# Patient Record
Sex: Male | Born: 1950 | Race: White | Hispanic: No | Marital: Married | Smoking: Former smoker
Health system: Southern US, Community
[De-identification: ages and names within clinical notes are randomized; demographics above are authoritative.]

## PROBLEM LIST (undated history)

## (undated) DIAGNOSIS — F1910 Other psychoactive substance abuse, uncomplicated: Secondary | ICD-10-CM

## (undated) DIAGNOSIS — F419 Anxiety disorder, unspecified: Secondary | ICD-10-CM

## (undated) DIAGNOSIS — I10 Essential (primary) hypertension: Secondary | ICD-10-CM

## (undated) DIAGNOSIS — I4729 Other ventricular tachycardia: Secondary | ICD-10-CM

## (undated) DIAGNOSIS — I493 Ventricular premature depolarization: Secondary | ICD-10-CM

## (undated) DIAGNOSIS — J45909 Unspecified asthma, uncomplicated: Secondary | ICD-10-CM

## (undated) DIAGNOSIS — U071 COVID-19: Secondary | ICD-10-CM

## (undated) DIAGNOSIS — E119 Type 2 diabetes mellitus without complications: Secondary | ICD-10-CM

## (undated) DIAGNOSIS — I509 Heart failure, unspecified: Secondary | ICD-10-CM

## (undated) DIAGNOSIS — K219 Gastro-esophageal reflux disease without esophagitis: Secondary | ICD-10-CM

## (undated) DIAGNOSIS — E785 Hyperlipidemia, unspecified: Secondary | ICD-10-CM

## (undated) HISTORY — DX: Anxiety disorder, unspecified: F41.9

## (undated) HISTORY — DX: Ventricular premature depolarization: I49.3

## (undated) HISTORY — DX: Unspecified asthma, uncomplicated: J45.909

## (undated) HISTORY — DX: Other psychoactive substance abuse, uncomplicated: F19.10

## (undated) HISTORY — DX: Hyperlipidemia, unspecified: E78.5

## (undated) HISTORY — PX: INGUINAL HERNIA REPAIR: SUR1180

## (undated) HISTORY — DX: Other ventricular tachycardia: I47.29

## (undated) HISTORY — DX: Gastro-esophageal reflux disease without esophagitis: K21.9

---

## 2004-03-11 ENCOUNTER — Ambulatory Visit: Payer: Self-pay | Admitting: Family Medicine

## 2004-04-23 ENCOUNTER — Ambulatory Visit: Payer: Self-pay | Admitting: Family Medicine

## 2004-06-03 ENCOUNTER — Ambulatory Visit: Payer: Self-pay | Admitting: Family Medicine

## 2004-08-29 ENCOUNTER — Ambulatory Visit: Payer: Self-pay | Admitting: Family Medicine

## 2004-09-11 ENCOUNTER — Ambulatory Visit: Payer: Self-pay | Admitting: Family Medicine

## 2004-12-06 ENCOUNTER — Ambulatory Visit: Payer: Self-pay | Admitting: Family Medicine

## 2005-05-05 ENCOUNTER — Ambulatory Visit: Payer: Self-pay | Admitting: Family Medicine

## 2018-06-08 ENCOUNTER — Telehealth: Payer: Self-pay

## 2018-06-08 NOTE — Telephone Encounter (Signed)
Please review previous message and advise 

## 2018-06-08 NOTE — Telephone Encounter (Signed)
Do you have a provider to which I can forward this request to?

## 2018-06-08 NOTE — Telephone Encounter (Signed)
-----   Message from Wilford Corner sent at 06/08/2018  9:03 AM EST ----- Regarding: Recall Colon? Hello Bri can you advised of pt recall colon please? MRN [id number]

## 2018-06-08 NOTE — Telephone Encounter (Signed)
Trisha Can you print out the last OV/colon report/biopsies from our old system and let me know. Thanks RG  

## 2018-06-08 NOTE — Telephone Encounter (Signed)
This is a Dr. Lyndel Safe Patient

## 2018-06-09 NOTE — Telephone Encounter (Signed)
These are on your desk for review

## 2018-06-09 NOTE — Telephone Encounter (Signed)
review 

## 2018-06-11 NOTE — Telephone Encounter (Signed)
Per Dr. Leland Her last colonoscopy report the patient was due in 09/2017. I have tried to call the patient to set this up and was unable to leave message.

## 2018-06-16 NOTE — Telephone Encounter (Signed)
Called patient , unable to leave message

## 2018-06-21 NOTE — Telephone Encounter (Signed)
Called patient unable to leave message

## 2019-03-21 ENCOUNTER — Emergency Department (HOSPITAL_COMMUNITY): Payer: Medicare HMO

## 2019-03-21 ENCOUNTER — Inpatient Hospital Stay (HOSPITAL_COMMUNITY)
Admission: EM | Admit: 2019-03-21 | Discharge: 2019-03-25 | DRG: 641 | Disposition: A | Discharge status: Home / Self Care | Payer: Medicare HMO | Attending: Internal Medicine | Admitting: Internal Medicine

## 2019-03-21 ENCOUNTER — Other Ambulatory Visit: Payer: Self-pay

## 2019-03-21 ENCOUNTER — Encounter (HOSPITAL_COMMUNITY): Payer: Self-pay

## 2019-03-21 DIAGNOSIS — E876 Hypokalemia: Secondary | ICD-10-CM | POA: Diagnosis present

## 2019-03-21 DIAGNOSIS — E871 Hypo-osmolality and hyponatremia: Principal | ICD-10-CM | POA: Diagnosis present

## 2019-03-21 DIAGNOSIS — H409 Unspecified glaucoma: Secondary | ICD-10-CM | POA: Diagnosis present

## 2019-03-21 DIAGNOSIS — E860 Dehydration: Secondary | ICD-10-CM | POA: Diagnosis present

## 2019-03-21 DIAGNOSIS — E861 Hypovolemia: Secondary | ICD-10-CM | POA: Diagnosis present

## 2019-03-21 DIAGNOSIS — E785 Hyperlipidemia, unspecified: Secondary | ICD-10-CM | POA: Diagnosis present

## 2019-03-21 DIAGNOSIS — F17210 Nicotine dependence, cigarettes, uncomplicated: Secondary | ICD-10-CM | POA: Diagnosis present

## 2019-03-21 DIAGNOSIS — T502X5A Adverse effect of carbonic-anhydrase inhibitors, benzothiadiazides and other diuretics, initial encounter: Secondary | ICD-10-CM | POA: Diagnosis present

## 2019-03-21 DIAGNOSIS — R630 Anorexia: Secondary | ICD-10-CM | POA: Diagnosis present

## 2019-03-21 DIAGNOSIS — K5900 Constipation, unspecified: Secondary | ICD-10-CM | POA: Diagnosis present

## 2019-03-21 DIAGNOSIS — E870 Hyperosmolality and hypernatremia: Secondary | ICD-10-CM | POA: Diagnosis present

## 2019-03-21 DIAGNOSIS — I10 Essential (primary) hypertension: Secondary | ICD-10-CM | POA: Diagnosis present

## 2019-03-21 DIAGNOSIS — F329 Major depressive disorder, single episode, unspecified: Secondary | ICD-10-CM | POA: Diagnosis present

## 2019-03-21 DIAGNOSIS — E119 Type 2 diabetes mellitus without complications: Secondary | ICD-10-CM | POA: Diagnosis present

## 2019-03-21 DIAGNOSIS — Z20828 Contact with and (suspected) exposure to other viral communicable diseases: Secondary | ICD-10-CM | POA: Diagnosis present

## 2019-03-21 DIAGNOSIS — Z452 Encounter for adjustment and management of vascular access device: Secondary | ICD-10-CM

## 2019-03-21 DIAGNOSIS — F419 Anxiety disorder, unspecified: Secondary | ICD-10-CM | POA: Diagnosis present

## 2019-03-21 DIAGNOSIS — G8929 Other chronic pain: Secondary | ICD-10-CM | POA: Diagnosis present

## 2019-03-21 DIAGNOSIS — Z7984 Long term (current) use of oral hypoglycemic drugs: Secondary | ICD-10-CM

## 2019-03-21 DIAGNOSIS — Z79899 Other long term (current) drug therapy: Secondary | ICD-10-CM

## 2019-03-21 HISTORY — DX: Type 2 diabetes mellitus without complications: E11.9

## 2019-03-21 HISTORY — DX: Essential (primary) hypertension: I10

## 2019-03-21 LAB — CBC
HCT: 35.6 % — ABNORMAL LOW (ref 39.0–52.0)
Hemoglobin: 12.6 g/dL — ABNORMAL LOW (ref 13.0–17.0)
MCH: 27.2 pg (ref 26.0–34.0)
MCHC: 35.4 g/dL (ref 30.0–36.0)
MCV: 76.9 fL — ABNORMAL LOW (ref 80.0–100.0)
Platelets: 242 10*3/uL (ref 150–400)
RBC: 4.63 MIL/uL (ref 4.22–5.81)
RDW: 12.9 % (ref 11.5–15.5)
WBC: 9.1 10*3/uL (ref 4.0–10.5)
nRBC: 0 % (ref 0.0–0.2)

## 2019-03-21 LAB — COMPREHENSIVE METABOLIC PANEL
ALT: 18 U/L (ref 0–44)
AST: 25 U/L (ref 15–41)
Albumin: 4 g/dL (ref 3.5–5.0)
Alkaline Phosphatase: 43 U/L (ref 38–126)
Anion gap: 12 (ref 5–15)
BUN: 6 mg/dL — ABNORMAL LOW (ref 8–23)
CO2: 24 mmol/L (ref 22–32)
Calcium: 8.2 mg/dL — ABNORMAL LOW (ref 8.9–10.3)
Chloride: 73 mmol/L — ABNORMAL LOW (ref 98–111)
Creatinine, Ser: 0.74 mg/dL (ref 0.61–1.24)
GFR calc Af Amer: >60 mL/min (ref >60)
GFR calc non Af Amer: >60 mL/min (ref >60)
Glucose, Bld: 131 mg/dL — ABNORMAL HIGH (ref 70–99)
Potassium: 3.7 mmol/L (ref 3.5–5.1)
Sodium: 109 mmol/L — CL (ref 135–145)
Total Bilirubin: 0.8 mg/dL (ref 0.3–1.2)
Total Protein: 6.6 g/dL (ref 6.5–8.1)

## 2019-03-21 IMAGING — CT CT HEAD W/O CM
4 series · 17 of 47 positions shown, 19 images · non-contrast
Comparison: None.

CLINICAL DATA: Worsening headache.

EXAM:
CT HEAD WITHOUT CONTRAST
TECHNIQUE: Contiguous axial images were obtained from the base of the skull
through the vertex without intravenous contrast.

[Series 3: head wo · axial · 0.41mm/px · z∈[-185,-65]mm · 7 of 32 slices shown, 9 images]
[im 4/32  brain]
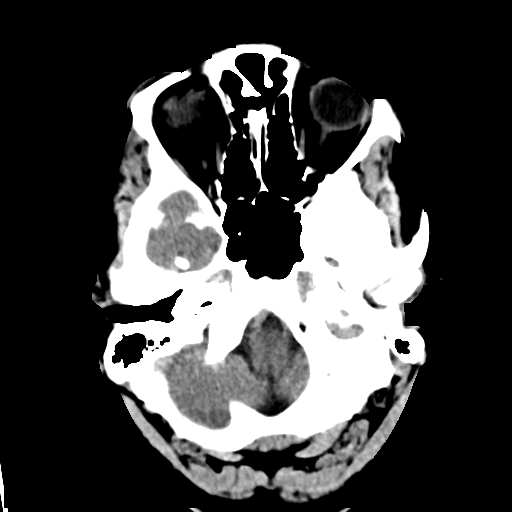
[im 4/32  bone]
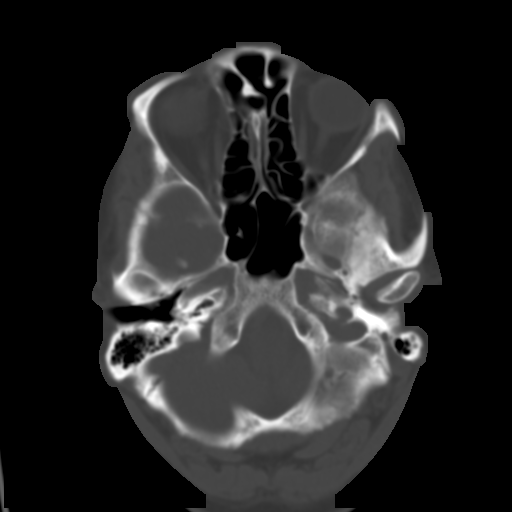
[im 8/32  brain]
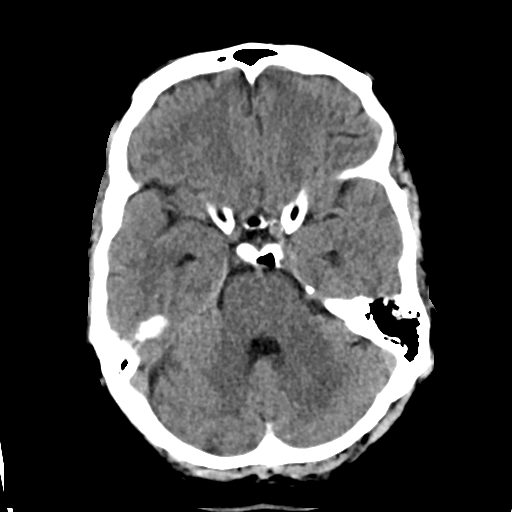
[im 12/32  brain]
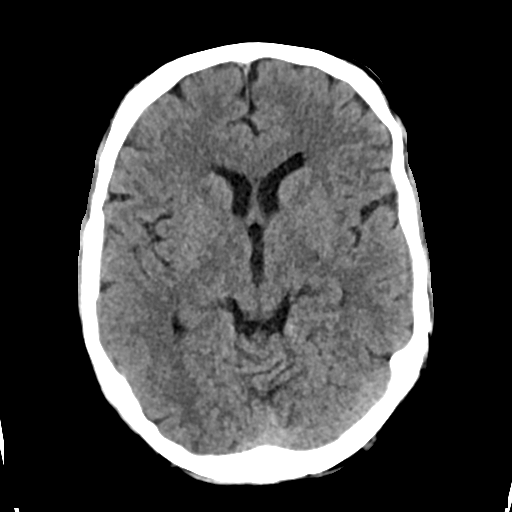
[im 16/32  brain]
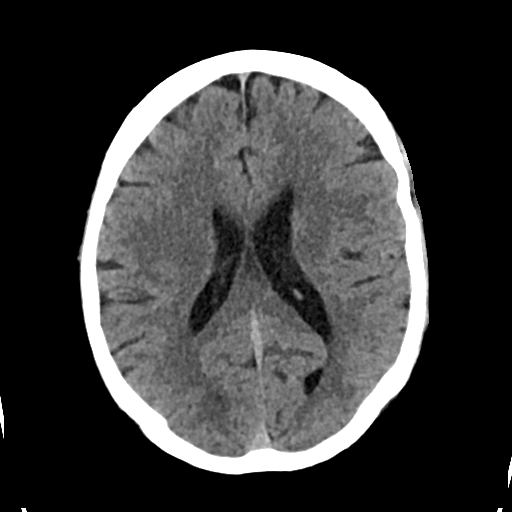
[im 20/32  brain]
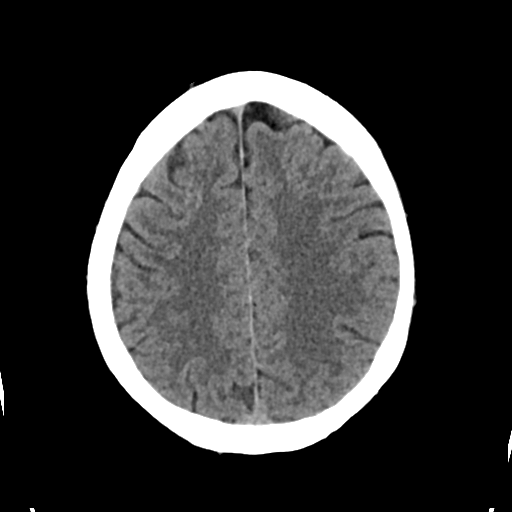
[im 20/32  bone]
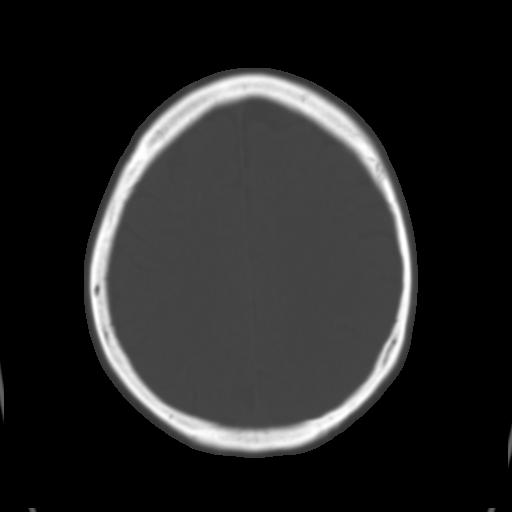
[im 24/32  brain]
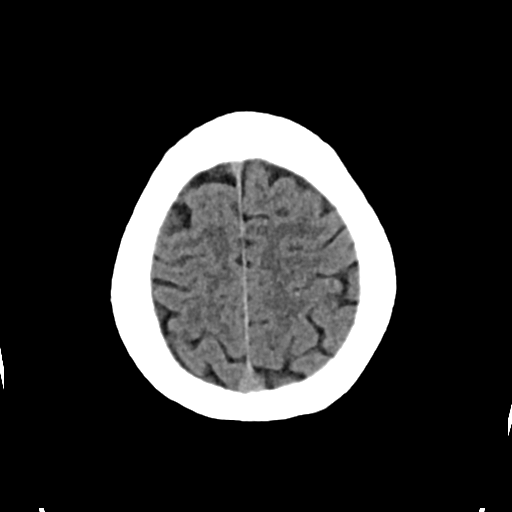
[im 28/32  brain]
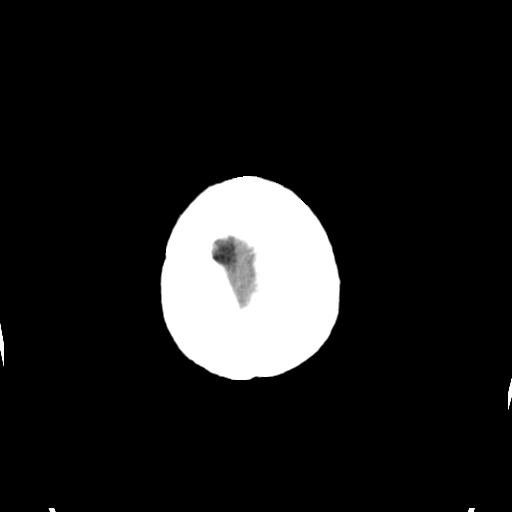

[Series 4: head bone · axial · 0.41mm/px · z∈[-186,-130]mm · 4 of 79 slices shown]
[im 8/79  bone]
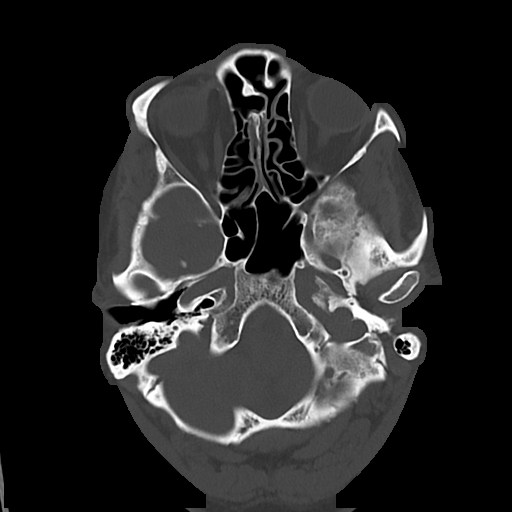
[im 16/79  bone]
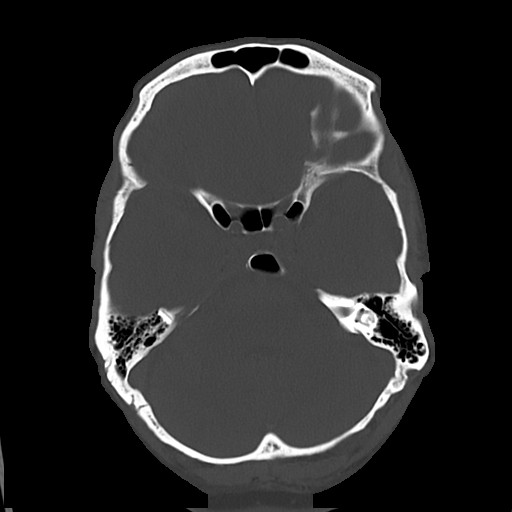
[im 24/79  bone]
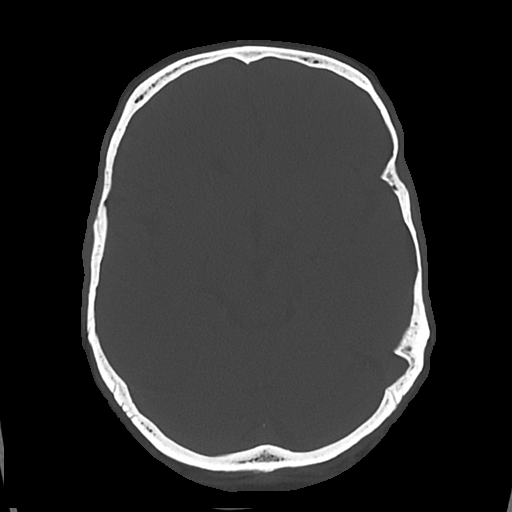
[im 36/79  bone]
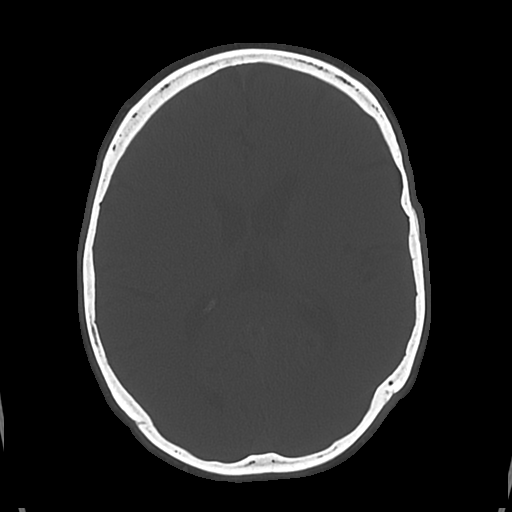

[Series 5: cor soft · coronal · 0.31mm/px · 3 of 66 slices shown]
[im 22/66  brain]
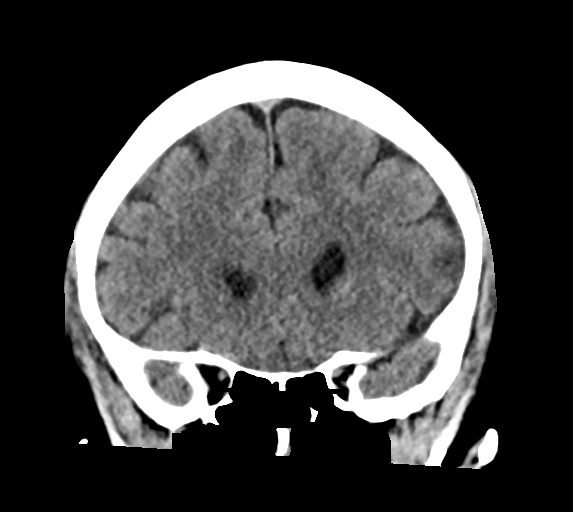
[im 29/66  brain]
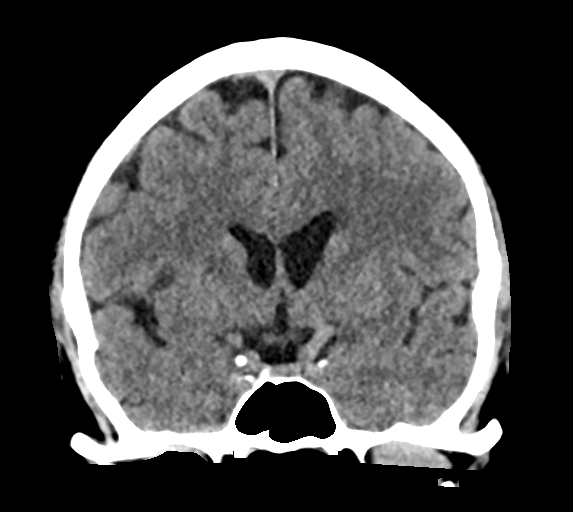
[im 37/66  brain]
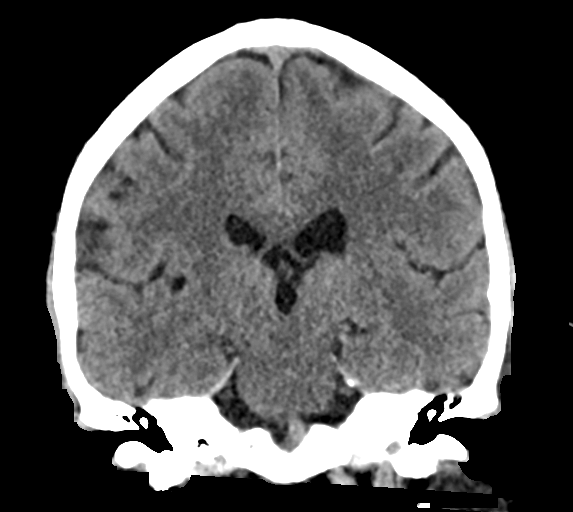

[Series 6: sag soft · sagittal · 0.33mm/px · 3 of 57 slices shown]
[im 19/57  brain]
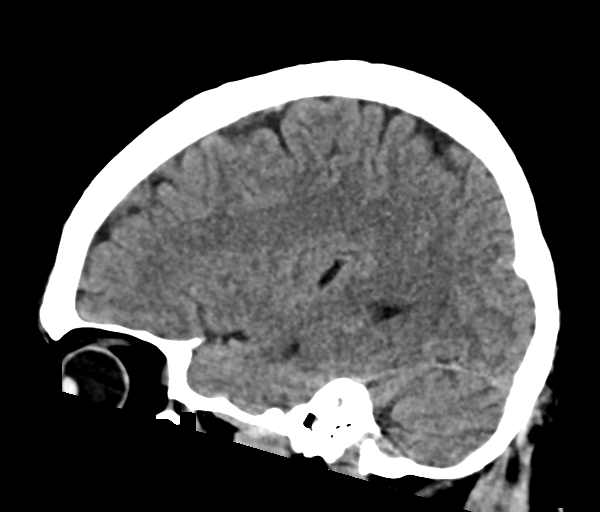
[im 29/57  brain]
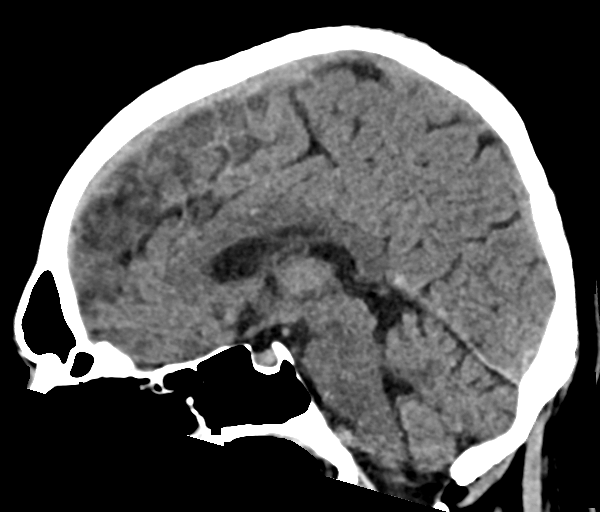
[im 38/57  brain]
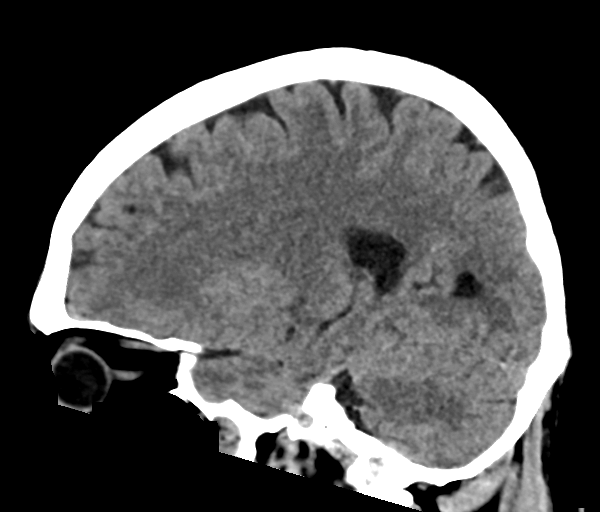

[17 of 47 positions shown; findings below may reference images not displayed]

FINDINGS: Brain: No evidence of acute infarction, hemorrhage, hydrocephalus,
extra-axial collection or mass lesion/mass effect.

Vascular: No hyperdense vessel or unexpected calcification.

Skull: Normal. Negative for fracture or focal lesion.

Sinuses/Orbits: No acute finding.

Other: None.
IMPRESSION: No acute intracranial abnormality.

## 2019-03-21 MED ORDER — SODIUM CHLORIDE 3 % IV SOLN
INTRAVENOUS | Status: DC
  Administered 2019-03-22: 18 mL/h via INTRAVENOUS
  Filled 2019-03-21: qty 500

## 2019-03-21 NOTE — ED Triage Notes (Signed)
Patient states that he is withdrawing from tramadol after taking multiple pills above normal dosage the past month. Nausea and headache. No tremors, alert and oriented, reports that he feels anxious

## 2019-03-21 NOTE — ED Provider Notes (Addendum)
[facility] EMERGENCY DEPARTMENT Provider Note   CSN: [id number] Arrival date & time: 03/21/19  1622     History Chief Complaint  Patient presents with  . pain/ tramadol not helping    Roy Duke is a 68 y.o. male.  HPI    68 year old male comes in a chief complaint of weakness. Patient has history of hypertension, diabetes.  He reports that he ran out of his tramadol 10 days ago.  Yesterday was able to get hold of more tramadol but despite taking it he continues to feel weak.  Patient is having intermittent headaches, generalized weakness, myalgias, dizziness and even confusion.  He is also had some nausea and vomiting.  He has been attributing the symptoms to tramadol withdrawals but despite taking the medications yesterday he continues to have the symptoms.  He denies any other substance abuse or new medication use.  Past Medical History:  Diagnosis Date  . Diabetes mellitus without complication (Roy Duke)   . Hypertension     There are no problems to display for this patient.   History reviewed. No pertinent surgical history.     No family history on file.  Social History   Tobacco Use  . Smoking status: Not on file  Substance Use Topics  . Alcohol use: Not on file  . Drug use: Not on file    Home Medications Prior to Admission medications   Not on File    Allergies    Codeine and Penicillins  Review of Systems   Review of Systems  Constitutional: Positive for activity change and fatigue.  Respiratory: Negative for shortness of breath.   Cardiovascular: Negative for chest pain.  Gastrointestinal: Negative for abdominal pain.  Musculoskeletal: Positive for arthralgias.  Neurological: Positive for dizziness.  Hematological: Does not bruise/bleed easily.  Psychiatric/Behavioral: Positive for confusion.  All other systems reviewed and are negative.   Physical Exam Updated Vital Signs BP 128/83 (BP Location: Left Arm)    Pulse 77   Temp 98.2 F (36.8 C) (Oral)   Resp 16   SpO2 98%   Physical Exam Vitals and nursing note reviewed.  Constitutional:      Appearance: He is well-developed.  HENT:     Head: Atraumatic.  Eyes:     Extraocular Movements: Extraocular movements intact.     Pupils: Pupils are equal, round, and reactive to light.     Comments: No nystagmus  Cardiovascular:     Rate and Rhythm: Normal rate.  Pulmonary:     Effort: Pulmonary effort is normal.  Musculoskeletal:     Cervical back: Neck supple.  Skin:    General: Skin is warm.  Neurological:     Mental Status: He is alert and oriented to person, place, and time.     Cranial Nerves: No cranial nerve deficit.     Sensory: No sensory deficit.     ED Results / Procedures / Treatments   Labs (all labs ordered are listed, but only abnormal results are displayed) Labs Reviewed  COMPREHENSIVE METABOLIC PANEL - Abnormal; Notable for the following components:      Result Value   Sodium 109 (*)    Chloride 73 (*)    Glucose, Bld 131 (*)    BUN 6 (*)    Calcium 8.2 (*)    All other components within normal limits  CBC - Abnormal; Notable for the following components:   Hemoglobin 12.6 (*)    HCT 35.6 (*)  MCV 76.9 (*)    All other components within normal limits  SARS CORONAVIRUS 2 (TAT 6-24 HRS)  URINALYSIS, ROUTINE W REFLEX MICROSCOPIC    EKG EKG Interpretation  Date/Time:  Monday March 21 2019 21:37:03 EST Ventricular Rate:  73 PR Interval:  184 QRS Duration: 170 QT Interval:  468 QTC Calculation: 515 R Axis:   68 Text Interpretation: Sinus rhythm with occasional Premature ventricular complexes Right bundle branch block Abnormal ECG No acute changes Prolonged QT No old tracing to compare Confirmed by Varney Biles [phone]) on 03/21/2019 9:56:09 PM   Radiology CT Head Wo Contrast  Result Date: 03/21/2019 CLINICAL DATA:  Worsening headache. EXAM: CT HEAD WITHOUT CONTRAST TECHNIQUE: Contiguous axial  images were obtained from the base of the skull through the vertex without intravenous contrast. COMPARISON:  None. FINDINGS: Brain: No evidence of acute infarction, hemorrhage, hydrocephalus, extra-axial collection or mass lesion/mass effect. Vascular: No hyperdense vessel or unexpected calcification. Skull: Normal. Negative for fracture or focal lesion. Sinuses/Orbits: No acute finding. Other: None. IMPRESSION: No acute intracranial abnormality. Electronically Signed   By: Constance Holster M.D.   On: 03/21/2019 21:37    Procedures .Critical Care Performed by: Varney Biles, MD Authorized by: Varney Biles, MD   Critical care provider statement:    Critical care time (minutes):  52   Critical care was necessary to treat or prevent imminent or life-threatening deterioration of the following conditions:  Metabolic crisis   Critical care was time spent personally by me on the following activities:  Discussions with consultants, evaluation of patient's response to treatment, examination of patient, ordering and performing treatments and interventions, ordering and review of laboratory studies, ordering and review of radiographic studies, pulse oximetry, re-evaluation of patient's condition, obtaining history from patient or surrogate and review of old charts   (including critical care time)  Medications Ordered in ED Medications - No data to display  ED Course  I have reviewed the triage vital signs and the nursing notes.  Pertinent labs & imaging results that were available during my care of the patient were reviewed by me and considered in my medical decision making (see chart for details).    MDM Rules/Calculators/A&P                      68 year old male comes in a chief complaint of weakness. He started taking tramadol yesterday after being off of it for 10 days.  He has been having multiple weeks symptoms that he has been attributing to tramadol withdrawal, but despite taking the  medications his symptoms have not improved prompting him to come to the ER.  Patient noted to have profound hyponatremia. 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  We will admit him for symptomatic severe hyponatremia.  9:56 PM  Disposition is admission to ICU versus admission to stepdown based on nephrology recommendations.  Final Clinical Impression(s) / ED Diagnoses Final diagnoses:  Acute hyponatremia    Rx / DC Orders ED Discharge Orders    None         Varney Biles, MD 03/21/19 2209

## 2019-03-21 NOTE — ED Notes (Signed)
Had pt placed on monitor, reports nausea and leg cramps.  Pt states he had been having trouble urinating (however he just went alittle) and constipation.

## 2019-03-21 NOTE — Consult Note (Addendum)
Reason for Consult: Hyponatremia Referring Physician:  Dr. Kathrynn Humble  Chief Complaint: Weakness  Assessment/Plan: 1. Hyponatremia - multifactorial but appears to be hypovolemic hyponatremia based on history and exam with SSRI (citalopram)  hypovolemia, nausea (strong stimuli for vasopressin release), pain, HCTZ - Hold HCTZ and citalopram - Will check TSH, cortisol, urine osmolality, urine studies and serum osmolality. - Will start on 3% at .55ml/kg/hr and monitor SNa q2hrs for 24-48hrs -> goal no more than 6-8 incr in SNa /1st 24hr to decr risk of osmotic demyelination. - At risk for overly  Rapid correction which is why lower rate initiated; once there is an upwards trend will consider starting desmopressin to decrease risk of overly rapid correction. - Start at 18 ml/hr (175 lbs) 2. Hypertension - controlled 3. DM 4. Depression 5. Chronic Pain    HPI: NICKLOUS FLORCZAK is an 68 y.o. male HTN DM remote history of alcohol and tobacco use (>30 years ago) who is on tramadol. He had been taking a  higher dose than usual and ran out of a 40 day supply in 30 days, running out 10 days ago. Since then he has been feeling weak with nausea, vomiting, constipation with a very poor appetite. He is on SSRI (citalopram), HCTZ and opiates, denies any recent ETOH use or illicit drug use, not on steroids. He has a headache but no visual disturbances and no h/o seizures. He also denies any weight loss, liver issues, cough, dyspnea, orthopnea or CP.  ROS Pertinent items are noted in HPI.  Chemistry and CBC: Creatinine, Ser  Date/Time Value Ref Range Status  03/21/2019 05:13 PM 0.74 0.61 - 1.24 mg/dL Final   Recent Labs  Lab 03/21/19 1713  NA 109*  K 3.7  CL 73*  CO2 24  GLUCOSE 131*  BUN 6*  CREATININE 0.74  CALCIUM 8.2*   Recent Labs  Lab 03/21/19 1713  WBC 9.1  HGB 12.6*  HCT 35.6*  MCV 76.9*  PLT 242   Liver Function Tests: Recent Labs  Lab 03/21/19 1713  AST 25  ALT 18   ALKPHOS 43  BILITOT 0.8  PROT 6.6  ALBUMIN 4.0   No results for input(s): LIPASE, AMYLASE in the last 168 hours. No results for input(s): AMMONIA in the last 168 hours. Cardiac Enzymes: No results for input(s): CKTOTAL, CKMB, CKMBINDEX, TROPONINI in the last 168 hours. Iron Studies: No results for input(s): IRON, TIBC, TRANSFERRIN, FERRITIN in the last 72 hours. PT/INR: @LABRCNTIP (inr:5)  Xrays/Other Studies: ) Results for orders placed or performed during the hospital encounter of 03/21/19 (from the past 48 hour(s))  Comprehensive metabolic panel     Status: Abnormal   Collection Time: 03/21/19  5:13 PM  Result Value Ref Range   Sodium 109 (LL) 135 - 145 mmol/L    Comment: CRITICAL RESULT CALLED TO, READ BACK BY AND VERIFIED WITH: RN L CHILTON AT 1843 03/21/2019 BY L BENFIELD    Potassium 3.7 3.5 - 5.1 mmol/L   Chloride 73 (L) 98 - 111 mmol/L   CO2 24 22 - 32 mmol/L   Glucose, Bld 131 (H) 70 - 99 mg/dL   BUN 6 (L) 8 - 23 mg/dL   Creatinine, Ser 0.74 0.61 - 1.24 mg/dL   Calcium 8.2 (L) 8.9 - 10.3 mg/dL   Total Protein 6.6 6.5 - 8.1 g/dL   Albumin 4.0 3.5 - 5.0 g/dL   AST 25 15 - 41 U/L   ALT 18 0 - 44 U/L   Alkaline Phosphatase  43 38 - 126 U/L   Total Bilirubin 0.8 0.3 - 1.2 mg/dL   GFR calc non Af Amer >60 >60 mL/min   GFR calc Af Amer >60 >60 mL/min   Anion gap 12 5 - 15    Comment: Performed at [facility] [address] [place], [place] [address]  CBC     Status: Abnormal   Collection Time: 03/21/19  5:13 PM  Result Value Ref Range   WBC 9.1 4.0 - 10.5 K/uL   RBC 4.63 4.22 - 5.81 MIL/uL   Hemoglobin 12.6 (L) 13.0 - 17.0 g/dL   HCT 35.6 (L) 39.0 - 52.0 %   MCV 76.9 (L) 80.0 - 100.0 fL   MCH 27.2 26.0 - 34.0 pg   MCHC 35.4 30.0 - 36.0 g/dL   RDW 12.9 11.5 - 15.5 %   Platelets 242 150 - 400 K/uL   nRBC 0.0 0.0 - 0.2 %    Comment: Performed at [facility], [place] [address] [place],  [address]   CT Head Wo Contrast  Result Date:  03/21/2019 CICAL DATA:  Worsening headache. EXAM: CT HEAD WITHOUT CONTRAST TECHNIQUE: Contiguous axial images were obtained from the base of the skull through the vertex without intravenous contrast. COMPARISON:  None. FINDINGS: Brain: No evidence of acute infarction, hemorrhage, hydrocephalus, extra-axial collection or mass lesion/mass effect. Vascular: No hyperdense vessel or unexpected calcification. Skull: Normal. Negative for fracture or focal lesion. Sinuses/Orbits: No acute finding. Other: None. IMPRESSION: No acute intracranial abnormality. Electronically Signed   By: Constance Holster M.D.   On: 03/21/2019 21:37    PMH:   Past Medical History:  Diagnosis Date  . Diabetes mellitus without complication ([place])   . Hypertension     PSH:  History reviewed. No pertinent surgical history.  Allergies:  Allergies  Allergen Reactions  . Codeine   . Penicillins     Medications:   Prior to Admission medications   Not on File    Discontinued Meds:  There are no discontinued medications.  Social History:  has no history on file for tobacco, alcohol, and drug.  Family History:  No family history on file.  Blood pressure 127/77, pulse 72, temperature 98.2 F (36.8 C), temperature source Oral, resp. rate 16, SpO2 98 %. General appearance: alert, cooperative and appears stated age Head: Normocephalic, without obvious abnormality, atraumatic Eyes: negative Neck: no adenopathy, no carotid bruit, no JVD, supple, symmetrical, trachea midline and thyroid not enlarged, symmetric, no tenderness/mass/nodules Back: symmetric, no curvature. ROM normal. No CVA tenderness. Resp: clear to auscultation bilaterally Cardio: regular rate and rhythm GI: soft, non-tender; bowel sounds normal; no masses,  no organomegaly Extremities: extremities normal, atraumatic, no cyanosis or edema Pulses: 2+ and symmetric Skin: Skin color, texture, turgor normal. No rashes or lesions Lymph nodes: Cervical  adenopathy: none Neurologic: Grossly normal       , Hunt Oris, MD 03/21/2019, 10:34 PM

## 2019-03-21 NOTE — ED Notes (Signed)
Requested pharmacy tech to update pt medication rec.

## 2019-03-21 NOTE — ED Notes (Signed)
Patient transported to CT 

## 2019-03-22 ENCOUNTER — Inpatient Hospital Stay (HOSPITAL_COMMUNITY): Payer: Medicare HMO

## 2019-03-22 ENCOUNTER — Encounter (HOSPITAL_COMMUNITY): Payer: Self-pay | Admitting: Pulmonary Disease

## 2019-03-22 DIAGNOSIS — E870 Hyperosmolality and hypernatremia: Secondary | ICD-10-CM

## 2019-03-22 DIAGNOSIS — G8929 Other chronic pain: Secondary | ICD-10-CM | POA: Diagnosis present

## 2019-03-22 DIAGNOSIS — F329 Major depressive disorder, single episode, unspecified: Secondary | ICD-10-CM | POA: Diagnosis present

## 2019-03-22 DIAGNOSIS — R630 Anorexia: Secondary | ICD-10-CM | POA: Diagnosis present

## 2019-03-22 DIAGNOSIS — Z79899 Other long term (current) drug therapy: Secondary | ICD-10-CM | POA: Diagnosis not present

## 2019-03-22 DIAGNOSIS — T502X5A Adverse effect of carbonic-anhydrase inhibitors, benzothiadiazides and other diuretics, initial encounter: Secondary | ICD-10-CM | POA: Diagnosis present

## 2019-03-22 DIAGNOSIS — F419 Anxiety disorder, unspecified: Secondary | ICD-10-CM | POA: Diagnosis present

## 2019-03-22 DIAGNOSIS — E876 Hypokalemia: Secondary | ICD-10-CM | POA: Diagnosis present

## 2019-03-22 DIAGNOSIS — F17210 Nicotine dependence, cigarettes, uncomplicated: Secondary | ICD-10-CM | POA: Diagnosis present

## 2019-03-22 DIAGNOSIS — Z452 Encounter for adjustment and management of vascular access device: Secondary | ICD-10-CM | POA: Diagnosis not present

## 2019-03-22 DIAGNOSIS — K5900 Constipation, unspecified: Secondary | ICD-10-CM | POA: Diagnosis present

## 2019-03-22 DIAGNOSIS — Z20828 Contact with and (suspected) exposure to other viral communicable diseases: Secondary | ICD-10-CM | POA: Diagnosis present

## 2019-03-22 DIAGNOSIS — E860 Dehydration: Secondary | ICD-10-CM | POA: Diagnosis present

## 2019-03-22 DIAGNOSIS — H409 Unspecified glaucoma: Secondary | ICD-10-CM | POA: Diagnosis present

## 2019-03-22 DIAGNOSIS — Z7984 Long term (current) use of oral hypoglycemic drugs: Secondary | ICD-10-CM | POA: Diagnosis not present

## 2019-03-22 DIAGNOSIS — E861 Hypovolemia: Secondary | ICD-10-CM | POA: Diagnosis present

## 2019-03-22 DIAGNOSIS — E785 Hyperlipidemia, unspecified: Secondary | ICD-10-CM | POA: Diagnosis present

## 2019-03-22 DIAGNOSIS — E871 Hypo-osmolality and hyponatremia: Secondary | ICD-10-CM | POA: Diagnosis present

## 2019-03-22 DIAGNOSIS — E119 Type 2 diabetes mellitus without complications: Secondary | ICD-10-CM | POA: Diagnosis present

## 2019-03-22 DIAGNOSIS — I10 Essential (primary) hypertension: Secondary | ICD-10-CM | POA: Diagnosis present

## 2019-03-22 LAB — BASIC METABOLIC PANEL
Anion gap: 11 (ref 5–15)
Anion gap: 9 (ref 5–15)
Anion gap: 9 (ref 5–15)
BUN: 6 mg/dL — ABNORMAL LOW (ref 8–23)
BUN: 8 mg/dL (ref 8–23)
BUN: 7 mg/dL — ABNORMAL LOW (ref 8–23)
CO2: 25 mmol/L (ref 22–32)
CO2: 25 mmol/L (ref 22–32)
CO2: 26 mmol/L (ref 22–32)
Calcium: 8.6 mg/dL — ABNORMAL LOW (ref 8.9–10.3)
Calcium: 8.3 mg/dL — ABNORMAL LOW (ref 8.9–10.3)
Calcium: 8.7 mg/dL — ABNORMAL LOW (ref 8.9–10.3)
Chloride: 84 mmol/L — ABNORMAL LOW (ref 98–111)
Chloride: 84 mmol/L — ABNORMAL LOW (ref 98–111)
Chloride: 84 mmol/L — ABNORMAL LOW (ref 98–111)
Creatinine, Ser: 0.76 mg/dL (ref 0.61–1.24)
Creatinine, Ser: 0.87 mg/dL (ref 0.61–1.24)
Creatinine, Ser: 0.81 mg/dL (ref 0.61–1.24)
GFR calc Af Amer: >60 mL/min (ref >60)
GFR calc Af Amer: >60 mL/min (ref >60)
GFR calc Af Amer: >60 mL/min (ref >60)
GFR calc non Af Amer: >60 mL/min (ref >60)
GFR calc non Af Amer: >60 mL/min (ref >60)
GFR calc non Af Amer: >60 mL/min (ref >60)
Glucose, Bld: 110 mg/dL — ABNORMAL HIGH (ref 70–99)
Glucose, Bld: 146 mg/dL — ABNORMAL HIGH (ref 70–99)
Glucose, Bld: 125 mg/dL — ABNORMAL HIGH (ref 70–99)
Potassium: 3.3 mmol/L — ABNORMAL LOW (ref 3.5–5.1)
Potassium: 3.8 mmol/L (ref 3.5–5.1)
Potassium: 4 mmol/L (ref 3.5–5.1)
Sodium: 120 mmol/L — ABNORMAL LOW (ref 135–145)
Sodium: 118 mmol/L — CL (ref 135–145)
Sodium: 119 mmol/L — CL (ref 135–145)

## 2019-03-22 LAB — RENAL FUNCTION PANEL
Albumin: 3.4 g/dL — ABNORMAL LOW (ref 3.5–5.0)
Anion gap: 8 (ref 5–15)
BUN: 8 mg/dL (ref 8–23)
CO2: 24 mmol/L (ref 22–32)
Calcium: 8.2 mg/dL — ABNORMAL LOW (ref 8.9–10.3)
Chloride: 88 mmol/L — ABNORMAL LOW (ref 98–111)
Creatinine, Ser: 0.95 mg/dL (ref 0.61–1.24)
GFR calc Af Amer: >60 mL/min (ref >60)
GFR calc non Af Amer: >60 mL/min (ref >60)
Glucose, Bld: 99 mg/dL (ref 70–99)
Phosphorus: 2.5 mg/dL (ref 2.5–4.6)
Potassium: 3.8 mmol/L (ref 3.5–5.1)
Sodium: 120 mmol/L — ABNORMAL LOW (ref 135–145)

## 2019-03-22 LAB — URINALYSIS, ROUTINE W REFLEX MICROSCOPIC
Bilirubin Urine: NEGATIVE
Glucose, UA: NEGATIVE mg/dL
Hgb urine dipstick: NEGATIVE
Ketones, ur: NEGATIVE mg/dL
Leukocytes,Ua: NEGATIVE
Nitrite: NEGATIVE
Protein, ur: NEGATIVE mg/dL
Specific Gravity, Urine: 1.002 — ABNORMAL LOW (ref 1.005–1.030)
pH: 7 (ref 5.0–8.0)

## 2019-03-22 LAB — HIV ANTIBODY (ROUTINE TESTING W REFLEX): HIV Screen 4th Generation wRfx: NONREACTIVE

## 2019-03-22 LAB — OSMOLALITY, URINE: Osmolality, Ur: 104 mOsm/kg — ABNORMAL LOW (ref 300–900)

## 2019-03-22 LAB — TSH: TSH: 1.397 u[IU]/mL (ref 0.350–4.500)

## 2019-03-22 LAB — RESPIRATORY PANEL BY RT PCR (FLU A&B, COVID)
Influenza A by PCR: NEGATIVE
Influenza B by PCR: NEGATIVE
SARS Coronavirus 2 by RT PCR: NEGATIVE

## 2019-03-22 LAB — CBC
HCT: 36.4 % — ABNORMAL LOW (ref 39.0–52.0)
Hemoglobin: 12.7 g/dL — ABNORMAL LOW (ref 13.0–17.0)
MCH: 27.1 pg (ref 26.0–34.0)
MCHC: 34.9 g/dL (ref 30.0–36.0)
MCV: 77.6 fL — ABNORMAL LOW (ref 80.0–100.0)
Platelets: 208 10*3/uL (ref 150–400)
RBC: 4.69 MIL/uL (ref 4.22–5.81)
RDW: 13.2 % (ref 11.5–15.5)
WBC: 5.1 10*3/uL (ref 4.0–10.5)
nRBC: 0 % (ref 0.0–0.2)

## 2019-03-22 LAB — SODIUM, URINE, RANDOM: Sodium, Ur: <10 mmol/L

## 2019-03-22 LAB — MRSA PCR SCREENING: MRSA by PCR: NEGATIVE

## 2019-03-22 LAB — MAGNESIUM: Magnesium: 2 mg/dL (ref 1.7–2.4)

## 2019-03-22 LAB — OSMOLALITY: Osmolality: 248 mOsm/kg — CL (ref 275–295)

## 2019-03-22 LAB — SARS CORONAVIRUS 2 (TAT 6-24 HRS): SARS Coronavirus 2: NEGATIVE

## 2019-03-22 LAB — PHOSPHORUS: Phosphorus: 3 mg/dL (ref 2.5–4.6)

## 2019-03-22 LAB — CREATININE, URINE, RANDOM: Creatinine, Urine: 21.16 mg/dL

## 2019-03-22 IMAGING — DX DG CHEST 1V PORT
1 series · 2 of 2 positions shown · non-contrast
Comparison: Radiograph [DATE]

CLINICAL DATA: Central line placement.

EXAM:
PORTABLE CHEST 1 VIEW

[Series 1: chest ap · 0.14mm/px · 2 of 2 slices shown]
[im 1/2]
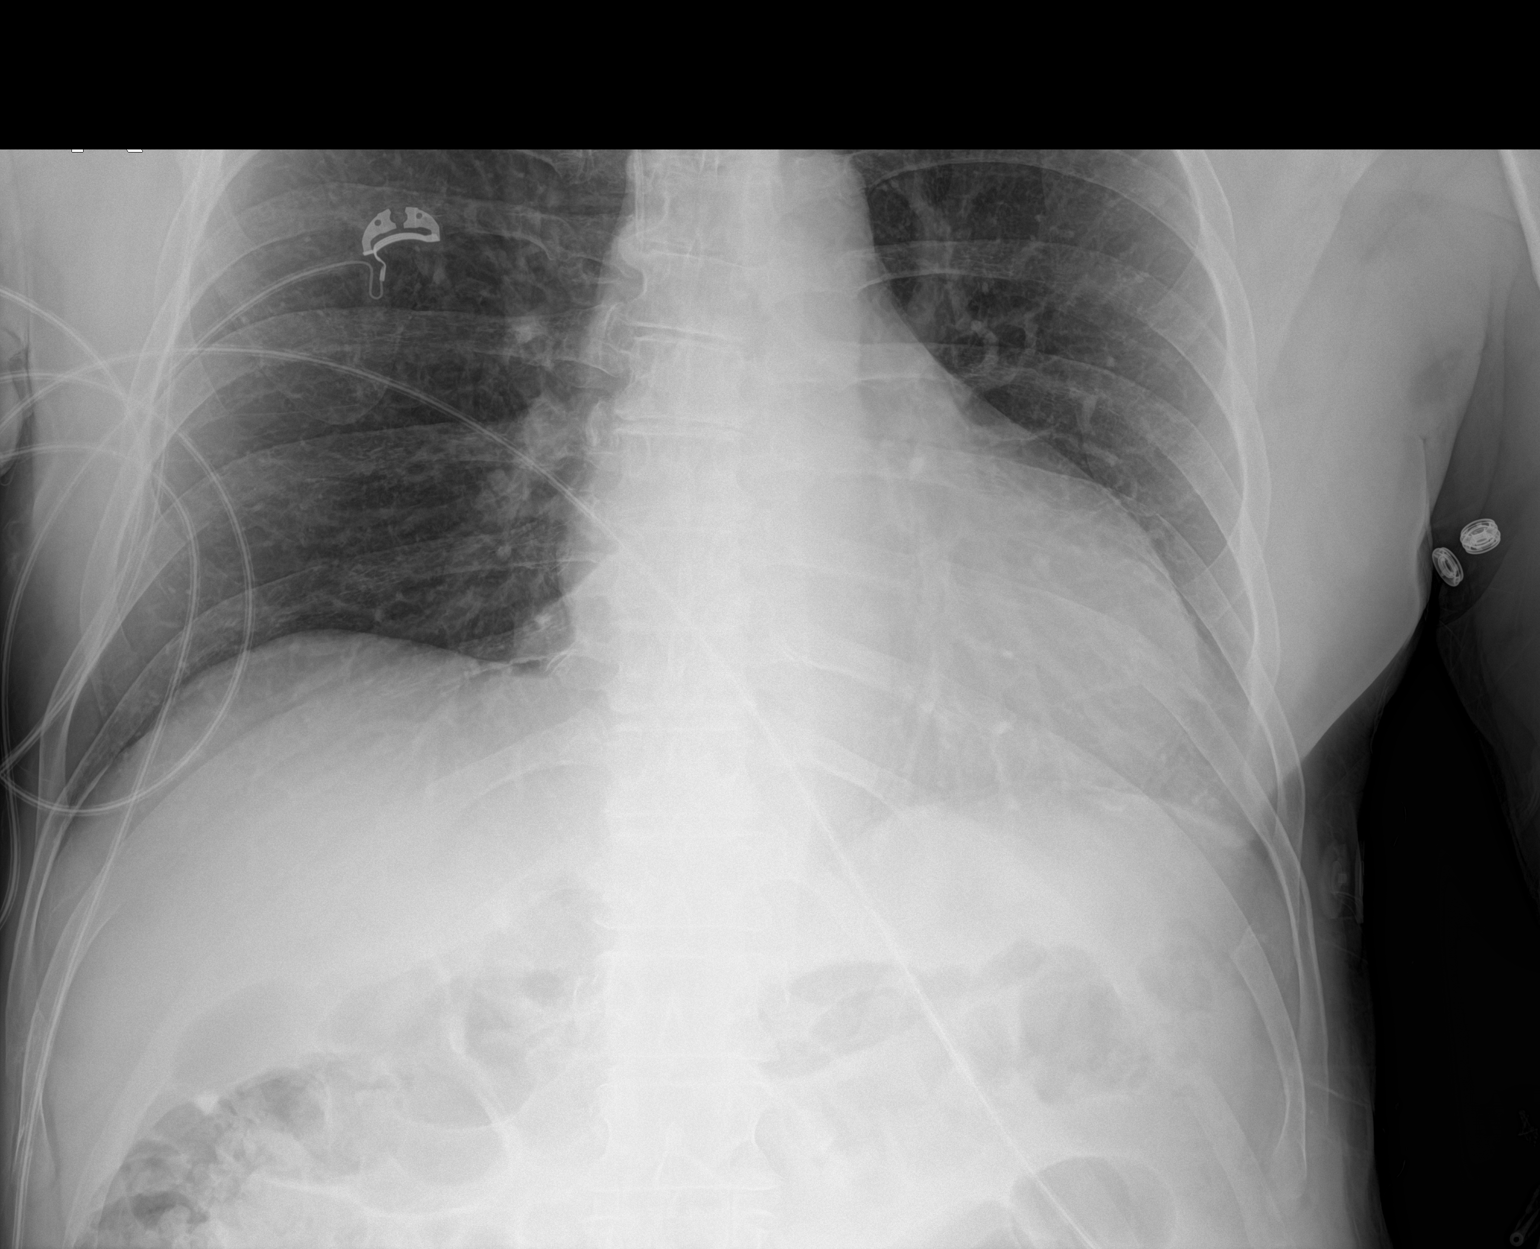
[im 2/2]
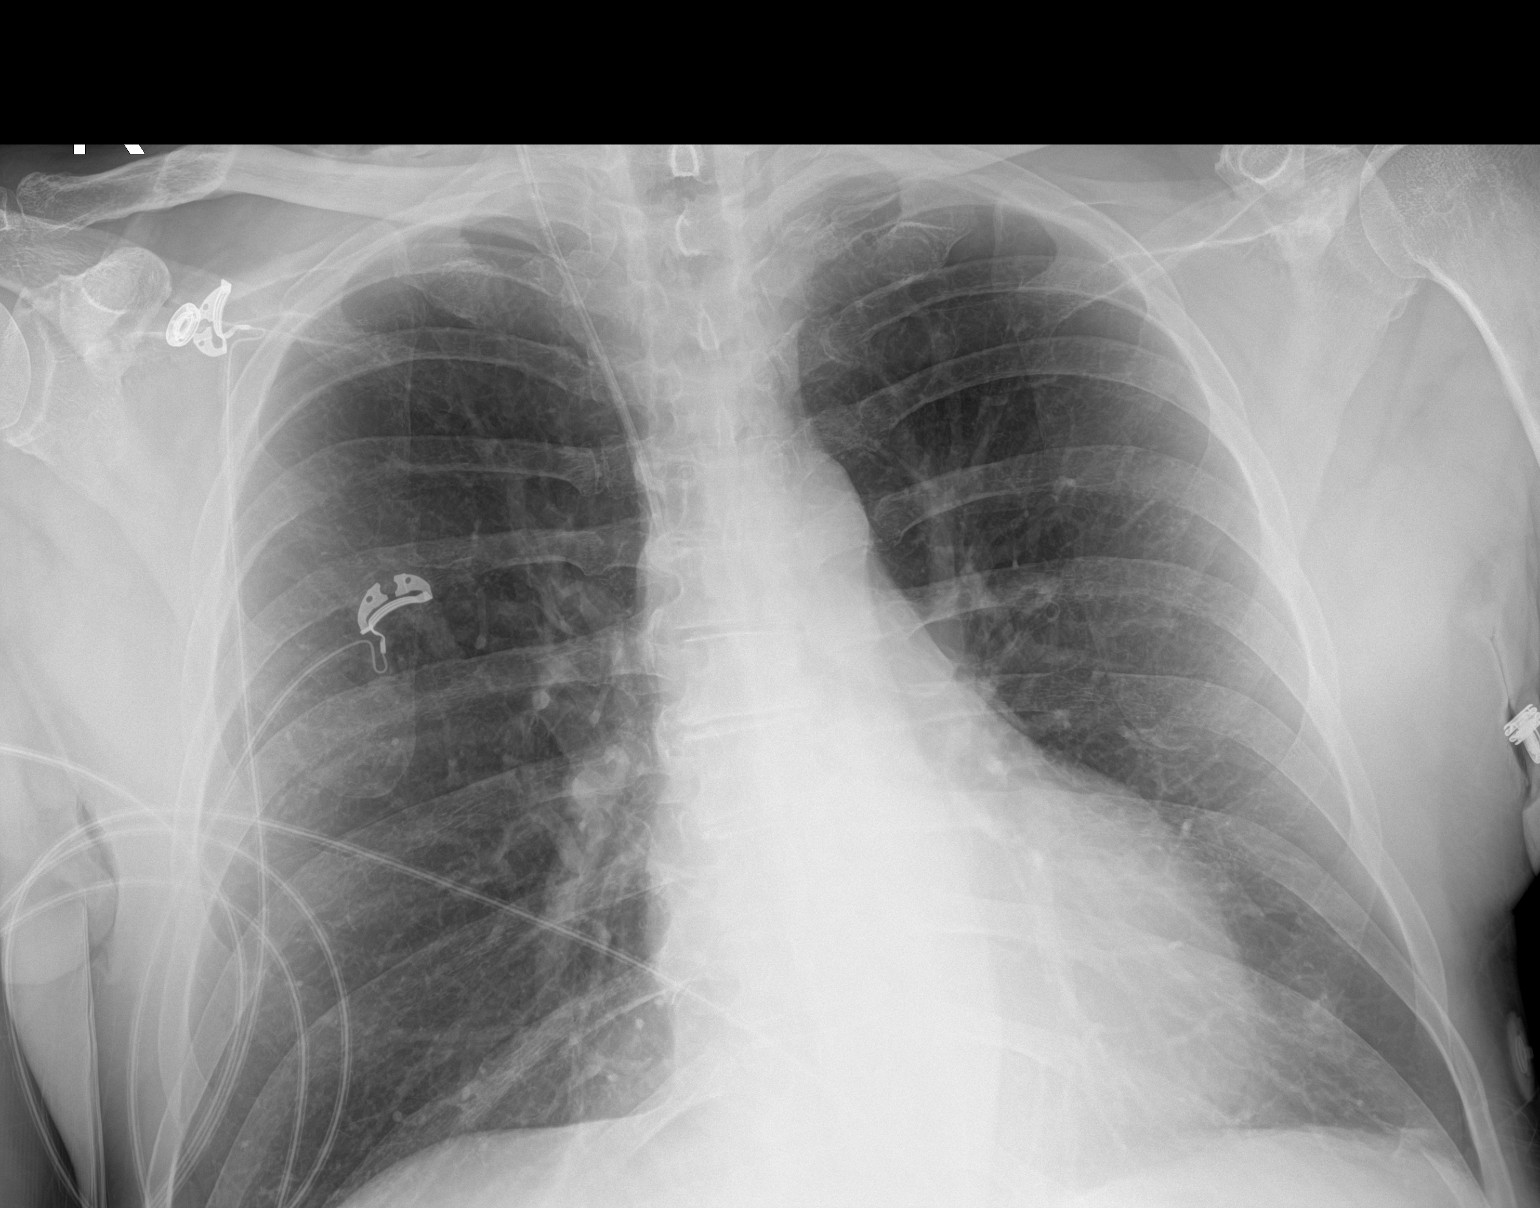

[2 of 2 positions shown; findings below may reference images not displayed]

FINDINGS: Right internal jugular central venous catheter tip in the upper SVC.
No pneumothorax. Normal heart size and mediastinal contours. Mild
hyperinflation. No focal airspace disease, pleural fluid or
pulmonary edema. No acute osseous abnormalities.
IMPRESSION: 1. Right internal jugular central venous catheter tip in the upper
SVC. No pneumothorax.
2. Mild hyperinflation, can be seen with bronchitis, asthma, or
COPD.

## 2019-03-22 MED ORDER — HEPARIN SODIUM (PORCINE) 5000 UNIT/ML IJ SOLN
5000.0000 [IU] | Freq: Three times a day (TID) | INTRAMUSCULAR | Status: DC
  Administered 2019-03-22 – 2019-03-23 (×6): 5000 [IU] via SUBCUTANEOUS
  Filled 2019-03-22 (×6): qty 1

## 2019-03-22 MED ORDER — POTASSIUM CHLORIDE CRYS ER 20 MEQ PO TBCR
40.0000 meq | EXTENDED_RELEASE_TABLET | Freq: Once | ORAL | Status: AC
  Administered 2019-03-22: 40 meq via ORAL
  Filled 2019-03-22: qty 2

## 2019-03-22 MED ORDER — DEXTROSE 5 % IV SOLN: INTRAVENOUS | Status: DC

## 2019-03-22 MED ORDER — CHLORHEXIDINE GLUCONATE CLOTH 2 % EX PADS
6.0000 | MEDICATED_PAD | Freq: Every day | CUTANEOUS | Status: DC
  Administered 2019-03-22 – 2019-03-23 (×2): 6 via TOPICAL

## 2019-03-22 MED ORDER — CLONAZEPAM 0.5 MG PO TBDP
0.5000 mg | ORAL_TABLET | Freq: Two times a day (BID) | ORAL | Status: DC | PRN
  Administered 2019-03-22 – 2019-03-24 (×5): 0.5 mg via ORAL
  Filled 2019-03-22 (×5): qty 1

## 2019-03-22 MED ORDER — PANTOPRAZOLE SODIUM 40 MG PO TBEC
40.0000 mg | DELAYED_RELEASE_TABLET | Freq: Every day | ORAL | Status: DC
  Administered 2019-03-22 – 2019-03-25 (×4): 40 mg via ORAL
  Filled 2019-03-22 (×4): qty 1

## 2019-03-22 MED ORDER — SENNOSIDES-DOCUSATE SODIUM 8.6-50 MG PO TABS
1.0000 | ORAL_TABLET | Freq: Two times a day (BID) | ORAL | Status: DC
  Administered 2019-03-22 – 2019-03-25 (×5): 1 via ORAL
  Filled 2019-03-22 (×6): qty 1

## 2019-03-22 MED ORDER — CLONAZEPAM 0.5 MG PO TBDP: 0.5000 mg | ORAL_TABLET | Freq: Two times a day (BID) | ORAL | Status: DC | PRN

## 2019-03-22 MED ORDER — LORAZEPAM 2 MG/ML IJ SOLN
1.0000 mg | Freq: Once | INTRAMUSCULAR | Status: AC
  Administered 2019-03-22: 1 mg via INTRAVENOUS
  Filled 2019-03-22: qty 1

## 2019-03-22 MED ORDER — ONDANSETRON HCL 4 MG/2ML IJ SOLN
4.0000 mg | Freq: Once | INTRAMUSCULAR | Status: AC
  Administered 2019-03-22: 4 mg via INTRAVENOUS
  Filled 2019-03-22: qty 2

## 2019-03-22 MED ORDER — LORAZEPAM 1 MG PO TABS
1.0000 mg | ORAL_TABLET | Freq: Four times a day (QID) | ORAL | Status: DC | PRN
  Administered 2019-03-22 – 2019-03-23 (×3): 1 mg via ORAL
  Filled 2019-03-22 (×3): qty 1

## 2019-03-22 NOTE — ED Provider Notes (Signed)
.  Central Line  Date/Time: 03/22/2019 1:40 AM Performed by: Ripley Fraise, MD Authorized by: Ripley Fraise, MD   Consent:    Consent obtained:  Written   Consent given by:  Patient   Risks discussed:  Arterial puncture, bleeding and infection   Alternatives discussed:  No treatment Pre-procedure details:    Hand hygiene: Hand hygiene performed prior to insertion     Sterile barrier technique: All elements of maximal sterile technique followed     Skin preparation:  2% chlorhexidine   Skin preparation agent: Skin preparation agent completely dried prior to procedure   Sedation:    Sedation type:  Anxiolysis Anesthesia (see MAR for exact dosages):    Anesthesia method:  Local infiltration   Local anesthetic:  Lidocaine 1% w/o epi Procedure details:    Location:  R internal jugular   Patient position:  Trendelenburg   Procedural supplies:  Triple lumen   Ultrasound guidance: yes     Sterile ultrasound techniques: Sterile gel and sterile probe covers were used     Number of attempts:  1   Successful placement: yes   Post-procedure details:    Post-procedure:  Dressing applied and line sutured   Assessment:  Blood return through all ports, free fluid flow, no pneumothorax on x-ray and placement verified by x-ray   Patient tolerance of procedure:  Tolerated well, no immediate complications   D/w critical care for admission Due to the nature of the hypertonic saline, it is best given to central access.  I had a long conversation with patient about central line placement.  Patient was agreeable to have procedure performed.   Ripley Fraise, MD 03/22/19 [phone]

## 2019-03-22 NOTE — Progress Notes (Signed)
[facility] Progress Note Patient Name: Roy Duke DOB: [birth year] MRN: [id number]   Date of Service  03/22/2019  HPI/Events of Note  Patient admitted to the ICU with severe hyponatremia after presenting to [facility] ED with vague symptoms. He was on hydrochlorothiazide   eICU Interventions  New patient evaluation completed.        Kerry Kass Sunaina Ferrando 03/22/2019, 3:26 AM

## 2019-03-22 NOTE — H&P (Signed)
NAME:  Roy Duke, MRN:  [id number], DOB:  Mar 28, 1951, LOS: 0 ADMISSION DATE:  03/21/2019, CONSULTATION DATE:  03/22/19 REFERRING MD:  Christy Gentles  CHIEF COMPLAINT:  Weakness   Brief History   TRAXTON Duke is a 68 y.o. male who was admitted with severe hyponatremia requiring 3% NS.  History of present illness   Roy Duke is a 68 y.o. male who has a PMH including but not limited to HTN, HLD, DM, depression. He presented to [facility] ED 12/14 with weakness, N/V, constipation, poor appetite.  He also had mild headache which had self resolved.  He had been taking slightly more than usual his dose of tramadol due to generalized pains and he therefore felt that he might have been withdrawing after running out for the past 10 days.  He was found to have severe hyponatremia (109).  It was noted that he was on chronic citalopram and HCTZ.  He used to drink alcohol years and smoke cigarettes ago but no longer does.    He was evaluated by nephrology who started him on hypertonic saline with frequent Na checks per protocol.  Head CT negative.  PCCM was subsequently asked to admit to the ICU.  Past Medical History  HTN, HLD, DM, depression.  Significant Hospital Events   11/15 > admit, started on 3% NS.  Consults:  Nephrology.  Procedures:  CVL pending by EDP 11/15 >   Significant Diagnostic Tests:  [facility] 11/15 > neg.  Micro Data:  SARS CoV2 11/14 >  Flu 11/14 > neg.  Antimicrobials:  None.   Interim history/subjective:  No complaints.  CVL being placed by EDP.  Objective:  Blood pressure 127/77, pulse 72, temperature 98.2 F (36.8 C), temperature source Oral, resp. rate 16, height 5\' 10"  (1.778 m), weight 77.1 kg, SpO2 98 %.       No intake or output data in the 24 hours ending 03/22/19 0129 Filed Weights   03/22/19 0054  Weight: 77.1 kg    Examination: General: Adult male, in NAD. Neuro: A&O x 3, no deficits. HEENT: [place]/AT. Sclerae anicteric.   EOMI.. Cardiovascular: RRR, no M/R/G.  Lungs: Respirations even and unlabored.  CTA bilaterally, No W/R/R.  Abdomen: BS x 4, soft, NT/ND.  Musculoskeletal: No gross deformities, no edema.  Skin: Intact, warm, no rashes.  Assessment & Plan:   Severe hyponatremia - presumed hypovolemic from N/V/decreased appetite + medication related (citalopram + HCTZ). - Appreciate nephrology assistance, recommending 3% at 67ml/hr for goal of max 6-8 mEq Na increase in first 24 hours. - Frequent Na checks per protocol. - F/u on TSH, cortisol, urine studies.  Hx HTN, HLD. - Continue home atorvastatin. - Hold home zestoretic. - Hydralazine PRN for SBP > 170.  Hx DM. - SSI if glucose consistently > 180. - Hold home metformin.  Hx depression. - Hold home citalopram.  Best Practice:  Diet: Heart healthy. Pain/Anxiety/Delirium protocol (if indicated): N/A. VAP protocol (if indicated): N/A. DVT prophylaxis: SCD's / Heparin. GI prophylaxis: N/A. Glucose control: SSI if glucose consistently > 180. Mobility: Bedrest. Code Status: Full. Family Communication: None. Disposition: ICU.  Labs   CBC: Recent Labs  Lab 03/21/19 1713  WBC 9.1  HGB 12.6*  HCT 35.6*  MCV 76.9*  PLT [id number]   Basic Metabolic Panel: Recent Labs  Lab 03/21/19 1713  NA 109*  K 3.7  CL 73*  CO2 24  GLUCOSE 131*  BUN 6*  CREATININE 0.74  CALCIUM 8.2*   GFR: Estimated  Creatinine Clearance: 91.3 mL/min (by C-G formula based on SCr of 0.74 mg/dL). Recent Labs  Lab 03/21/19 1713  WBC 9.1   Liver Function Tests: Recent Labs  Lab 03/21/19 1713  AST 25  ALT 18  ALKPHOS 43  BILITOT 0.8  PROT 6.6  ALBUMIN 4.0   No results for input(s): LIPASE, AMYLASE in the last 168 hours. No results for input(s): AMMONIA in the last 168 hours. ABG No results found for: PHART, PCO2ART, PO2ART, HCO3, TCO2, ACIDBASEDEF, O2SAT  Coagulation Profile: No results for input(s): INR, PROTIME in the last 168 hours. Cardiac  Enzymes: No results for input(s): CKTOTAL, CKMB, CKMBINDEX, TROPONINI in the last 168 hours. HbA1C: No results found for: HGBA1C CBG: No results for input(s): GLUCAP in the last 168 hours.  Review of Systems:   All negative; except for those that are bolded, which indicate positives.  Constitutional: weight loss, weight gain, night sweats, fevers, chills, fatigue, weakness, generalized pain (chronic). HEENT: headaches, sore throat, sneezing, nasal congestion, post nasal drip, difficulty swallowing, tooth/dental problems, visual complaints, visual changes, ear aches. Neuro: difficulty with speech, weakness, numbness, ataxia. CV:  chest pain, orthopnea, PND, swelling in lower extremities, dizziness, palpitations, syncope.  Resp: cough, hemoptysis, dyspnea, wheezing. GI: heartburn, indigestion, abdominal pain, nausea, vomiting, diarrhea, constipation, change in bowel habits, loss of appetite, hematemesis, melena, hematochezia.  GU: dysuria, change in color of urine, urgency or frequency, flank pain, hematuria. MSK: joint pain or swelling, decreased range of motion. Psych: change in mood or affect, depression, anxiety, suicidal ideations, homicidal ideations. Skin: rash, itching, bruising.   Past medical history  He,  has a past medical history of Diabetes mellitus without complication ([place]) and Hypertension.   Surgical History   History reviewed. No pertinent surgical history.   Social History      Family history   His family history is not on file.   Allergies Allergies  Allergen Reactions  . Codeine Nausea Only    "makes me feel weird"  . Penicillins Other (See Comments)    States a long time ago, does not recall reaction      Home meds  Prior to Admission medications   Medication Sig Start Date End Date Taking? Authorizing Provider  atorvastatin (LIPITOR) 40 MG tablet Take 40 mg by mouth daily at 6 PM.  02/16/19  Yes [provider]  citalopram (CELEXA) 10 MG  tablet Take 10 mg by mouth at bedtime.  03/12/19  Yes [provider]  latanoprost (XALATAN) 0.005 % ophthalmic solution Place 1 drop into both eyes at bedtime. 02/16/19  Yes [provider]  lisinopril-hydrochlorothiazide (ZESTORETIC) 20-25 MG tablet Take 1 tablet by mouth daily. 03/06/19  Yes [provider]  metFORMIN (GLUCOPHAGE) 500 MG tablet Take 500 mg by mouth daily. 06/14/18  Yes [provider]  omeprazole (PRILOSEC) 40 MG capsule Take 40 mg by mouth daily. 01/13/19  Yes [provider]  traMADol (ULTRAM) 50 MG tablet Take 100 mg by mouth every 6 (six) hours. 03/20/19  Yes [provider]     Montey Hora, [facility] 03/22/2019, 1:29 AM

## 2019-03-22 NOTE — Progress Notes (Signed)
Seen and examined. No major events. Sodium up too much, correction per nephrology.  Trial of clonazepam for tramadol withdrawal. Can probably go to floor later today  Erskine Emery MD

## 2019-03-22 NOTE — Progress Notes (Signed)
[facility] NEPHROLOGY PROGRESS NOTE  Assessment/ Plan: Pt is a 68 y.o. yo male with HTN, DM, remote history of alcohol use, consulted for hyponatremia, sodium 109 on admission.  #Hyponatremia, hypovolemic: Multifactorial etiology including due to HCTZ, SSRI, nausea and pain causing vasopressin release.  Urine sodium <10 points towards dehydration. -Sodium level 109 on admission and is started 3% saline.  No lab result noted overnight.  This morning the serum sodium level increased to 120.  Discontinue hypertonic saline and start D5W at 50 cc an hour for overcorrection of sodium.  Monitor sodium every 4 hour and adjust the fluid rate.  Discussed with ICU nurse.  #Hypertension: BP acceptable.  May be able to introduce lisinopril gradually.  Discontinue HCTZ.  #Hypokalemia: Replete potassium chloride especially since dextrose will cause cellular shifting.  Subjective: Seen and examined at bedside.  Patient reports sluggish, he got Ativan last night.  Denies nausea, vomiting, headache, dizziness, chest pain or shortness of breath. Objective Vital signs in last 24 hours: Vitals:   03/22/19 0500 03/22/19 0600 03/22/19 0700 03/22/19 0741  BP: 115/78 (!) 141/60 115/61   Pulse:  95 85   Resp: 13 16 (!) 28   Temp:    98.3 F (36.8 C)  TempSrc:    Oral  SpO2:  100% 95%   Weight:      Height:       Weight change:   Intake/Output Summary (Last 24 hours) at 03/22/2019 0806 Last data filed at 03/22/2019 0747 Gross per 24 hour  Intake 81.62 ml  Output 1100 ml  Net -1018.38 ml       Labs: Basic Metabolic Panel: Recent Labs  Lab 03/21/19 1713 03/22/19 0554  NA 109* 120*  K 3.7 3.3*  CL 73* 84*  CO2 24 25  GLUCOSE 131* 110*  BUN 6* 6*  CREATININE 0.74 0.76  CALCIUM 8.2* 8.6*  PHOS  --  3.0   Liver Function Tests: Recent Labs  Lab 03/21/19 1713  AST 25  ALT 18  ALKPHOS 43  BILITOT 0.8  PROT 6.6  ALBUMIN 4.0   No results for input(s): LIPASE, AMYLASE in  the last 168 hours. No results for input(s): AMMONIA in the last 168 hours. CBC: Recent Labs  Lab 03/21/19 1713 03/22/19 0554  WBC 9.1 5.1  HGB 12.6* 12.7*  HCT 35.6* 36.4*  MCV 76.9* 77.6*  PLT 242 208   Cardiac Enzymes: No results for input(s): CKTOTAL, CKMB, CKMBINDEX, TROPONINI in the last 168 hours. CBG: No results for input(s): GLUCAP in the last 168 hours.  Iron Studies: No results for input(s): IRON, TIBC, TRANSFERRIN, FERRITIN in the last 72 hours. Studies/Results: CT Head Wo Contrast  Result Date: 03/21/2019 CLINICAL DATA:  Worsening headache. EXAM: CT HEAD WITHOUT CONTRAST TECHNIQUE: Contiguous axial images were obtained from the base of the skull through the vertex without intravenous contrast. COMPARISON:  None. FINDINGS: Brain: No evidence of acute infarction, hemorrhage, hydrocephalus, extra-axial collection or mass lesion/mass effect. Vascular: No hyperdense vessel or unexpected calcification. Skull: Normal. Negative for fracture or focal lesion. Sinuses/Orbits: No acute finding. Other: None. IMPRESSION: No acute intracranial abnormality. Electronically Signed   By: Constance Holster M.D.   On: 03/21/2019 21:37   DG Chest Port 1 View  Result Date: 03/22/2019 CLINICAL DATA:  Central line placement. EXAM: PORTABLE CHEST 1 VIEW COMPARISON:  Radiograph 09/07/2004 FINDINGS: Right internal jugular central venous catheter tip in the upper SVC. No pneumothorax. Normal heart size and mediastinal contours. Mild hyperinflation. No  focal airspace disease, pleural fluid or pulmonary edema. No acute osseous abnormalities. IMPRESSION: 1. Right internal jugular central venous catheter tip in the upper SVC. No pneumothorax. 2. Mild hyperinflation, can be seen with bronchitis, asthma, or COPD. Electronically Signed   By: Keith Rake M.D.   On: 03/22/2019 02:10    Medications: Infusions: . dextrose      Scheduled Medications: . Chlorhexidine Gluconate Cloth  6 each Topical  Daily  . heparin  5,000 Units Subcutaneous Q8H    have reviewed scheduled and prn medications.  Physical Exam: General:NAD, comfortable Heart:RRR, s1s2 nl Lungs:clear b/l, no crackle Abdomen:soft, Non-tender, non-distended Extremities:No edema Neurology: Alert, awake and following commands.  Medard Decuir Reesa Chew Hailee Hollick 03/22/2019,8:06 AM  LOS: 0 days  Pager: [phone]

## 2019-03-23 ENCOUNTER — Inpatient Hospital Stay (HOSPITAL_COMMUNITY): Payer: Medicare HMO

## 2019-03-23 DIAGNOSIS — Z452 Encounter for adjustment and management of vascular access device: Secondary | ICD-10-CM

## 2019-03-23 DIAGNOSIS — E871 Hypo-osmolality and hyponatremia: Principal | ICD-10-CM

## 2019-03-23 LAB — RENAL FUNCTION PANEL
Albumin: 3.5 g/dL (ref 3.5–5.0)
Anion gap: 8 (ref 5–15)
BUN: 7 mg/dL — ABNORMAL LOW (ref 8–23)
CO2: 25 mmol/L (ref 22–32)
Calcium: 8.4 mg/dL — ABNORMAL LOW (ref 8.9–10.3)
Chloride: 86 mmol/L — ABNORMAL LOW (ref 98–111)
Creatinine, Ser: 0.56 mg/dL — ABNORMAL LOW (ref 0.61–1.24)
GFR calc Af Amer: >60 mL/min (ref >60)
GFR calc non Af Amer: >60 mL/min (ref >60)
Glucose, Bld: 147 mg/dL — ABNORMAL HIGH (ref 70–99)
Phosphorus: 2.2 mg/dL — ABNORMAL LOW (ref 2.5–4.6)
Potassium: 3.7 mmol/L (ref 3.5–5.1)
Sodium: 119 mmol/L — CL (ref 135–145)

## 2019-03-23 LAB — SODIUM
Sodium: 121 mmol/L — ABNORMAL LOW (ref 135–145)
Sodium: 121 mmol/L — ABNORMAL LOW (ref 135–145)

## 2019-03-23 IMAGING — DX DG CHEST 1V PORT
1 series · 1 of 1 positions shown · non-contrast
Comparison: [DATE]

CLINICAL DATA: Respiratory failure. Shortness of breath.

EXAM:
PORTABLE CHEST 1 VIEW

[chest ap]
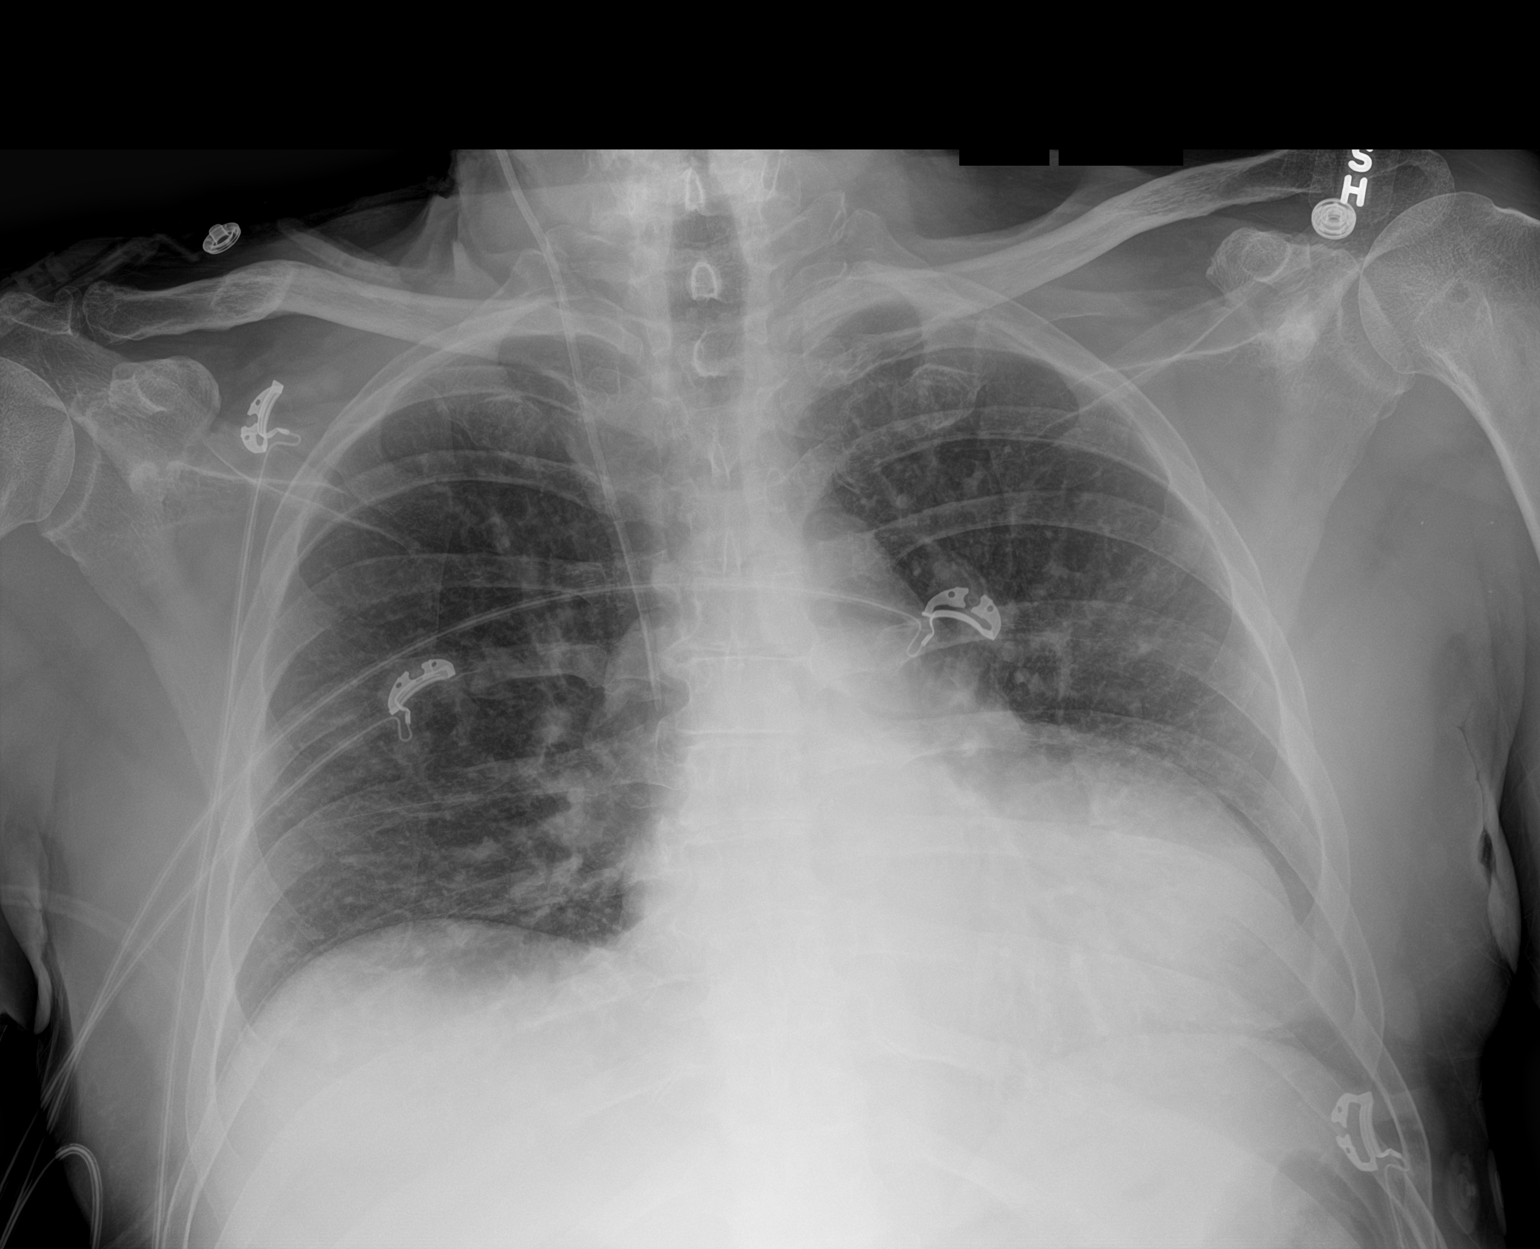

[1 of 1 positions shown; findings below may reference images not displayed]

FINDINGS: Right IJ catheter tip projects over the cavoatrial junction. Cardiac
enlargement. Decreased lung volumes compared with previous exam. No
airspace opacities identified. Blunting of the costophrenic angles
may reflect small pleural effusions.
IMPRESSION: 1. No change in aeration to the lungs compared with previous exam.
2. Suspect small bilateral pleural effusions.

## 2019-03-23 MED ORDER — POTASSIUM CHLORIDE CRYS ER 20 MEQ PO TBCR
40.0000 meq | EXTENDED_RELEASE_TABLET | Freq: Once | ORAL | Status: AC
  Administered 2019-03-23: 40 meq via ORAL
  Filled 2019-03-23: qty 2

## 2019-03-23 MED ORDER — CHLORHEXIDINE GLUCONATE CLOTH 2 % EX PADS: 6.0000 | MEDICATED_PAD | Freq: Every day | CUTANEOUS | Status: DC

## 2019-03-23 MED ORDER — LATANOPROST 0.005 % OP SOLN
1.0000 [drp] | Freq: Every day | OPHTHALMIC | Status: DC
  Administered 2019-03-23 – 2019-03-24 (×2): 1 [drp] via OPHTHALMIC
  Filled 2019-03-23: qty 2.5

## 2019-03-23 MED ORDER — ATORVASTATIN CALCIUM 40 MG PO TABS
40.0000 mg | ORAL_TABLET | Freq: Every day | ORAL | Status: DC
  Administered 2019-03-23 – 2019-03-24 (×2): 40 mg via ORAL
  Filled 2019-03-23 (×2): qty 1

## 2019-03-23 MED ORDER — CARVEDILOL 3.125 MG PO TABS
3.1250 mg | ORAL_TABLET | Freq: Two times a day (BID) | ORAL | Status: DC
  Administered 2019-03-23 – 2019-03-24 (×2): 3.125 mg via ORAL
  Filled 2019-03-23 (×2): qty 1

## 2019-03-23 NOTE — Progress Notes (Signed)
Roy Duke  Assessment/ Plan: Pt is a 68 y.o. yo male with HTN, DM, remote history of alcohol use, consulted for hyponatremia, sodium 109 on admission.  #Hyponatremia, hypovolemic: Multifactorial etiology including due to HCTZ, SSRI, nausea and pain causing vasopressin release.  Urine sodium <10 points towards dehydration. -Sodium level 109 on admission and started 3% saline.  The sodium level overcorrected therefore started on D5W yesterday.  Sodium level 119, increased by 10 in around 32 hours.  I will discontinue D5W and continue to monitor sodium level every 4 hour.  #Hypertension: BP acceptable.  May be able to introduce lisinopril gradually.  Discontinue HCTZ.  #Hypokalemia: Replete KCl.  Monitor lab.  Subjective: Seen and examined at bedside.  Feels weak.  No new event.  Received D5W and the dose was increased 100 cc/h yesterday evening. Objective Vital signs in last 24 hours: Vitals:   03/23/19 0403 03/23/19 0500 03/23/19 0600 03/23/19 0755  BP:  (!) 157/113 127/77   Pulse:  81 (!) 28   Resp:  19 18   Temp: 98 F (36.7 C)   (!) 97.5 F (36.4 C)  TempSrc: Axillary   Oral  SpO2:  95% 96%   Weight:   78.2 kg   Height:       Weight change: 1.089 kg  Intake/Output Summary (Last 24 hours) at 03/23/2019 0825 Last data filed at 03/23/2019 0600 Gross per 24 hour  Intake 1838.72 ml  Output 2300 ml  Net -461.28 ml       Labs: Basic Metabolic Panel: Recent Labs  Lab 03/22/19 0554 03/22/19 1439 03/22/19 2045 03/23/19 0434  NA 120* 119* 120* 119*  K 3.3* 4.0 3.8 3.7  CL 84* 84* 88* 86*  CO2 25 26 24 25   GLUCOSE 110* 125* 99 147*  BUN 6* 7* 8 7*  CREATININE 0.76 0.81 0.95 0.56*  CALCIUM 8.6* 8.7* 8.2* 8.4*  PHOS 3.0  --  2.5 2.2*   Liver Function Tests: Recent Labs  Lab 03/21/19 1713 03/22/19 2045 03/23/19 0434  AST 25  --   --   ALT 18  --   --   ALKPHOS 43  --   --   BILITOT 0.8  --   --   PROT 6.6  --   --    ALBUMIN 4.0 3.4* 3.5   No results for input(s): LIPASE, AMYLASE in the last 168 hours. No results for input(s): AMMONIA in the last 168 hours. CBC: Recent Labs  Lab 03/21/19 1713 03/22/19 0554  WBC 9.1 5.1  HGB 12.6* 12.7*  HCT 35.6* 36.4*  MCV 76.9* 77.6*  PLT 242 208   Cardiac Enzymes: No results for input(s): CKTOTAL, CKMB, CKMBINDEX, TROPONINI in the last 168 hours. CBG: No results for input(s): GLUCAP in the last 168 hours.  Iron Studies: No results for input(s): IRON, TIBC, TRANSFERRIN, FERRITIN in the last 72 hours. Studies/Results: CT Head Wo Contrast  Result Date: 03/21/2019 CLINICAL DATA:  Worsening headache. EXAM: CT HEAD WITHOUT CONTRAST TECHNIQUE: Contiguous axial images were obtained from the base of the skull through the vertex without intravenous contrast. COMPARISON:  None. FINDINGS: Brain: No evidence of acute infarction, hemorrhage, hydrocephalus, extra-axial collection or mass lesion/mass effect. Vascular: No hyperdense vessel or unexpected calcification. Skull: Normal. Negative for fracture or focal lesion. Sinuses/Orbits: No acute finding. Other: None. IMPRESSION: No acute intracranial abnormality. Electronically Signed   By: Constance Holster M.D.   On: 03/21/2019 21:37   DG Chest Port 1  View  Result Date: 03/22/2019 CLINICAL DATA:  Central line placement. EXAM: PORTABLE CHEST 1 VIEW COMPARISON:  Radiograph 09/07/2004 FINDINGS: Right internal jugular central venous catheter tip in the upper SVC. No pneumothorax. Normal heart size and mediastinal contours. Mild hyperinflation. No focal airspace disease, pleural fluid or pulmonary edema. No acute osseous abnormalities. IMPRESSION: 1. Right internal jugular central venous catheter tip in the upper SVC. No pneumothorax. 2. Mild hyperinflation, can be seen with bronchitis, asthma, or COPD. Electronically Signed   By: Keith Rake M.D.   On: 03/22/2019 02:10    Medications: Infusions:   Scheduled  Medications: . Chlorhexidine Gluconate Cloth  6 each Topical Daily  . heparin  5,000 Units Subcutaneous Q8H  . pantoprazole  40 mg Oral Daily  . senna-docusate  1 tablet Oral BID    have reviewed scheduled and prn medications.  Physical Exam: General:NAD, comfortable Heart:RRR, s1s2 nl Lungs: Clear b/l, no crackle Abdomen:soft, Non-tender, non-distended Extremities:No edema Neurology: Alert, awake and following commands.  Seeley Hissong Prasad Kara Melching 03/23/2019,8:25 AM  LOS: 1 day  Pager: [phone]

## 2019-03-23 NOTE — Progress Notes (Signed)
PROGRESS NOTE    JAYVIER BLAKELEY  [id number] DOB: [birth year] DOA: 03/21/2019 PCP: Patient, No Pcp Per   Brief Narrative: 68 year old male with hypertension hyperlipidemia type 2 diabetes anxiety and depression admitted with weakness nausea vomiting constipation poor appetite on 03/22/2019.  He was found to be profoundly hyponatremic with a sodium of 109.  Prior to admission he was on citalopram and hydrochlorothiazide.  Patient admitted by [facility] and followed by nephrology.  He was treated with hypertonic saline.  Head CT negative.  TRH pickup 03/23/2019. Assessment & Plan:   Active Problems:   Hypernatremia  #1 hypovolemic hyponatremia likely secondary to use of SSRI and HCTZ.  Treated with hypertonic saline.  Sodium up to 119 today.  BMP every 4.  Due to overcorrection of sodium IV fluids were switched to D5 yesterday.  Nephrology has discontinued D5W and wants to monitor sodium.  #2 hypertension 1 42/94?   restart ACE inhibitor soon.  Will start him on small dose of Coreg.  #3 hypokalemia potassium 3.7, give 40 of K. Dur.  Keep K above 4.  Multiple PVCs on the monitor.  #4 possible tramadol withdrawal on Klonopin.  #5 hyperlipidemia on Lipitor at home will restart.  #6 glaucoma restart Xalatan eyedrops.  #7 type 2 diabetes on Glucophage 500 mg daily continue SSI for now.      Estimated body mass index is 24.74 kg/m as calculated from the following:   Height as of this encounter: 5\' 10"  (1.778 m).   Weight as of this encounter: 78.2 kg.  DVT prophylaxis: Heparin  code Status: Full code  family Communication: None  disposition Plan: Pending clinical improvement Consultants:   Nephrology and PCCM  Procedures: None Antimicrobials: None  Subjective: Patient is resting in bed was asleep when I walked into the room but able to wake him up patient able to follow commands and answer questions appropriately.  Patient having multiple PVCs on the  monitor  Objective: Vitals:   03/23/19 0800 03/23/19 0900 03/23/19 1000 03/23/19 1100  BP: 119/76 (!) 133/91 (!) 142/94   Pulse: 69 72 84 83  Resp: (!) 21 19 17 18   Temp:      TempSrc:      SpO2: 94% 95% 96% 95%  Weight:      Height:        Intake/Output Summary (Last 24 hours) at 03/23/2019 1112 Last data filed at 03/23/2019 1000 Gross per 24 hour  Intake 1705.56 ml  Output 2250 ml  Net -544.44 ml   Filed Weights   03/22/19 0054 03/22/19 0310 03/23/19 0600  Weight: 77.1 kg 78.8 kg 78.2 kg    Examination:  General exam: Appears calm and comfortable  Respiratory system: Clear to auscultation. Respiratory effort normal. Cardiovascular system: S1 & S2 heard, RRR. No JVD, murmurs, rubs, gallops or clicks. No pedal edema. Gastrointestinal system: Abdomen is nondistended, soft and nontender. No organomegaly or masses felt. Normal bowel sounds heard. Central nervous system: Alert and oriented. No focal neurological deficits. Extremities: Symmetric 5 x 5 power. Skin: No rashes, lesions or ulcers Psychiatry: Judgement and insight appear normal. Mood & affect appropriate.     Data Reviewed: I have personally reviewed following labs and imaging studies  CBC: Recent Labs  Lab 03/21/19 1713 03/22/19 0554  WBC 9.1 5.1  HGB 12.6* 12.7*  HCT 35.6* 36.4*  MCV 76.9* 77.6*  PLT 242 [id number]   Basic Metabolic Panel: Recent Labs  Lab 03/22/19 0554 03/22/19 0955 03/22/19 1439 03/22/19 2045 03/23/19  0434  NA 120* 118* 119* 120* 119*  K 3.3* 3.8 4.0 3.8 3.7  CL 84* 84* 84* 88* 86*  CO2 25 25 26 24 25   GLUCOSE 110* 146* 125* 99 147*  BUN 6* 8 7* 8 7*  CREATININE 0.76 0.87 0.81 0.95 0.56*  CALCIUM 8.6* 8.3* 8.7* 8.2* 8.4*  MG 2.0  --   --   --   --   PHOS 3.0  --   --  2.5 2.2*   GFR: Estimated Creatinine Clearance: 91.3 mL/min (A) (by C-G formula based on SCr of 0.56 mg/dL (L)). Liver Function Tests: Recent Labs  Lab 03/21/19 1713 03/22/19 2045 03/23/19 0434  AST 25   --   --   ALT 18  --   --   ALKPHOS 43  --   --   BILITOT 0.8  --   --   PROT 6.6  --   --   ALBUMIN 4.0 3.4* 3.5   No results for input(s): LIPASE, AMYLASE in the last 168 hours. No results for input(s): AMMONIA in the last 168 hours. Coagulation Profile: No results for input(s): INR, PROTIME in the last 168 hours. Cardiac Enzymes: No results for input(s): CKTOTAL, CKMB, CKMBINDEX, TROPONINI in the last 168 hours. BNP (last 3 results) No results for input(s): PROBNP in the last 8760 hours. HbA1C: No results for input(s): HGBA1C in the last 72 hours. CBG: No results for input(s): GLUCAP in the last 168 hours. Lipid Profile: No results for input(s): CHOL, HDL, LDLCALC, TRIG, CHOLHDL, LDLDIRECT in the last 72 hours. Thyroid Function Tests: Recent Labs    03/22/19 0554  TSH 1.397   Anemia Panel: No results for input(s): VITAMINB12, FOLATE, FERRITIN, TIBC, IRON, RETICCTPCT in the last 72 hours. Sepsis Labs: No results for input(s): PROCALCITON, LATICACIDVEN in the last 168 hours.  Recent Results (from the past 240 hour(s))  SARS CORONAVIRUS 2 (TAT 6-24 HRS) Nasopharyngeal Nasopharyngeal Swab     Status: None   Collection Time: 03/21/19  9:05 PM   Specimen: Nasopharyngeal Swab  Result Value Ref Range Status   SARS Coronavirus 2 NEGATIVE NEGATIVE Final    Comment: (NOTE) SARS-CoV-2 target nucleic acids are NOT DETECTED. The SARS-CoV-2 RNA is generally detectable in upper and lower respiratory specimens during the acute phase of infection. Negative results do not preclude SARS-CoV-2 infection, do not rule out co-infections with other pathogens, and should not be used as the sole basis for treatment or other patient management decisions. Negative results must be combined with clinical observations, patient history, and epidemiological information. The expected result is Negative. Fact Sheet for Patients: SugarRoll.be Fact Sheet for Healthcare  Providers: https://www.woods-[employer]arema [employer]occi.com/ This test is not yet approved or cleared by the [place] FDA and  has been authorized for detection and/or diagnosis of SARS-CoV-2 by FDA under an Emergency Use Authorization (EUA). This EUA will remain  in effect (meaning this test can be used) for the duration of the COVID-19 declaration under Section 56 4(b)(1) of the Act, 21 U.S.C. section 360bbb-3(b)(1), unless the authorization is terminated or revoked sooner. Performed at [facility], [place] [address] [place], [place] [address]   Respiratory Panel by RT PCR (Flu A&B, Covid) - Nasopharyngeal Swab     Status: None   Collection Time: 03/21/19 11:56 PM   Specimen: Nasopharyngeal Swab  Result Value Ref Range Status   SARS Coronavirus 2 by RT PCR NEGATIVE NEGATIVE Final    Comment: (NOTE) SARS-CoV-2 target nucleic acids are NOT  DETECTED. The SARS-CoV-2 RNA is generally detectable in upper respiratoy specimens during the acute phase of infection. The lowest concentration of SARS-CoV-2 viral copies this assay can detect is 131 copies/mL. A negative result does not preclude SARS-Cov-2 infection and should not be used as the sole basis for treatment or other patient management decisions. A negative result may occur with  improper specimen collection/handling, submission of specimen other than nasopharyngeal swab, presence of viral mutation(s) within the areas targeted by this assay, and inadequate number of viral copies (<131 copies/mL). A negative result must be combined with clinical observations, patient history, and epidemiological information. The expected result is Negative. Fact Sheet for Patients:  PinkCheek.be Fact Sheet for Healthcare Providers:  GravelBags.it This test is not yet ap proved or cleared by the [place] FDA and  has been authorized for detection and/or diagnosis of SARS-CoV-2 by FDA  under an Emergency Use Authorization (EUA). This EUA will remain  in effect (meaning this test can be used) for the duration of the COVID-19 declaration under Section 564(b)(1) of the Act, 21 U.S.C. section 360bbb-3(b)(1), unless the authorization is terminated or revoked sooner.    Influenza A by PCR NEGATIVE NEGATIVE Final   Influenza B by PCR NEGATIVE NEGATIVE Final    Comment: (NOTE) The Xpert Xpress SARS-CoV-2/FLU/RSV assay is intended as an aid in  the diagnosis of influenza from Nasopharyngeal swab specimens and  should not be used as a sole basis for treatment. Nasal washings and  aspirates are unacceptable for Xpert Xpress SARS-CoV-2/FLU/RSV  testing. Fact Sheet for Patients: PinkCheek.be Fact Sheet for Healthcare Providers: GravelBags.it This test is not yet approved or cleared by the [place] FDA and  has been authorized for detection and/or diagnosis of SARS-CoV-2 by  FDA under an Emergency Use Authorization (EUA). This EUA will remain  in effect (meaning this test can be used) for the duration of the  Covid-19 declaration under Section 564(b)(1) of the Act, 21  U.S.C. section 360bbb-3(b)(1), unless the authorization is  terminated or revoked. Performed at [facility], [place] [address] [place], [place] [address]   MRSA PCR Screening     Status: None   Collection Time: 03/22/19  3:10 AM   Specimen: Nasopharyngeal  Result Value Ref Range Status   MRSA by PCR NEGATIVE NEGATIVE Final    Comment:        The GeneXpert MRSA Assay (FDA approved for NASAL specimens only), is one component of a comprehensive MRSA colonization surveillance program. It is not intended to diagnose MRSA infection nor to guide or monitor treatment for MRSA infections. Performed at [facility], [place] [address], [place], [place] [address]          Radiology Studies: CT Head Wo Contrast  Result Date:  03/21/2019 CLINICAL DATA:  Worsening headache. EXAM: CT HEAD WITHOUT CONTRAST TECHNIQUE: Contiguous axial images were obtained from the base of the skull through the vertex without intravenous contrast. COMPARISON:  None. FINDINGS: Brain: No evidence of acute infarction, hemorrhage, hydrocephalus, extra-axial collection or mass lesion/mass effect. Vascular: No hyperdense vessel or unexpected calcification. Skull: Normal. Negative for fracture or focal lesion. Sinuses/Orbits: No acute finding. Other: None. IMPRESSION: No acute intracranial abnormality. Electronically Signed   By: Constance Holster M.D.   On: 03/21/2019 21:37   DG Chest Port 1 View  Result Date: 03/23/2019 CLINICAL DATA:  Respiratory failure. Shortness of breath. EXAM: PORTABLE CHEST 1 VIEW COMPARISON:  03/22/2019 FINDINGS: Right IJ catheter tip projects over the  cavoatrial junction. Cardiac enlargement. Decreased lung volumes compared with previous exam. No airspace opacities identified. Blunting of the costophrenic angles may reflect small pleural effusions. IMPRESSION: 1. No change in aeration to the lungs compared with previous exam. 2. Suspect small bilateral pleural effusions. Electronically Signed   By: Kerby Moors M.D.   On: 03/23/2019 09:11   DG Chest Port 1 View  Result Date: 03/22/2019 CLINICAL DATA:  Central line placement. EXAM: PORTABLE CHEST 1 VIEW COMPARISON:  Radiograph 09/07/2004 FINDINGS: Right internal jugular central venous catheter tip in the upper SVC. No pneumothorax. Normal heart size and mediastinal contours. Mild hyperinflation. No focal airspace disease, pleural fluid or pulmonary edema. No acute osseous abnormalities. IMPRESSION: 1. Right internal jugular central venous catheter tip in the upper SVC. No pneumothorax. 2. Mild hyperinflation, can be seen with bronchitis, asthma, or COPD. Electronically Signed   By: Keith Rake M.D.   On: 03/22/2019 02:10        Scheduled Meds: . Chlorhexidine  Gluconate Cloth  6 each Topical Daily  . heparin  5,000 Units Subcutaneous Q8H  . pantoprazole  40 mg Oral Daily  . senna-docusate  1 tablet Oral BID   Continuous Infusions:   LOS: 1 day      Georgette Shell, MD [facility]  If 7PM-7AM, please contact night-coverage [email or web address] Password [email or web address] 03/23/2019, 11:12 AM

## 2019-03-23 NOTE — Progress Notes (Signed)
Patient stated he has extreme anxiety from dealing with his situation of being in the ICU. Requested anti-anxiety medication and this RN gave 1 mg of Ativan.

## 2019-03-24 LAB — RENAL FUNCTION PANEL
Albumin: 3.5 g/dL (ref 3.5–5.0)
Anion gap: 9 (ref 5–15)
BUN: 6 mg/dL — ABNORMAL LOW (ref 8–23)
CO2: 24 mmol/L (ref 22–32)
Calcium: 8.6 mg/dL — ABNORMAL LOW (ref 8.9–10.3)
Chloride: 92 mmol/L — ABNORMAL LOW (ref 98–111)
Creatinine, Ser: 0.75 mg/dL (ref 0.61–1.24)
GFR calc Af Amer: >60 mL/min (ref >60)
GFR calc non Af Amer: >60 mL/min (ref >60)
Glucose, Bld: 123 mg/dL — ABNORMAL HIGH (ref 70–99)
Phosphorus: 2.1 mg/dL — ABNORMAL LOW (ref 2.5–4.6)
Potassium: 4.5 mmol/L (ref 3.5–5.1)
Sodium: 125 mmol/L — ABNORMAL LOW (ref 135–145)

## 2019-03-24 LAB — CBC WITH DIFFERENTIAL/PLATELET
Abs Immature Granulocytes: 0.02 10*3/uL (ref 0.00–0.07)
Basophils Absolute: 0 10*3/uL (ref 0.0–0.1)
Basophils Relative: 0 %
Eosinophils Absolute: 0.1 10*3/uL (ref 0.0–0.5)
Eosinophils Relative: 1 %
HCT: 38.4 % — ABNORMAL LOW (ref 39.0–52.0)
Hemoglobin: 12.8 g/dL — ABNORMAL LOW (ref 13.0–17.0)
Immature Granulocytes: 0 %
Lymphocytes Relative: 17 %
Lymphs Abs: 1 10*3/uL (ref 0.7–4.0)
MCH: 26.9 pg (ref 26.0–34.0)
MCHC: 33.3 g/dL (ref 30.0–36.0)
MCV: 80.8 fL (ref 80.0–100.0)
Monocytes Absolute: 0.8 10*3/uL (ref 0.1–1.0)
Monocytes Relative: 13 %
Neutro Abs: 3.9 10*3/uL (ref 1.7–7.7)
Neutrophils Relative %: 69 %
Platelets: 207 10*3/uL (ref 150–400)
RBC: 4.75 MIL/uL (ref 4.22–5.81)
RDW: 13.9 % (ref 11.5–15.5)
WBC: 5.7 10*3/uL (ref 4.0–10.5)
nRBC: 0 % (ref 0.0–0.2)

## 2019-03-24 LAB — SODIUM: Sodium: 124 mmol/L — ABNORMAL LOW (ref 135–145)

## 2019-03-24 LAB — BASIC METABOLIC PANEL
Anion gap: 8 (ref 5–15)
BUN: 7 mg/dL — ABNORMAL LOW (ref 8–23)
CO2: 22 mmol/L (ref 22–32)
Calcium: 8.6 mg/dL — ABNORMAL LOW (ref 8.9–10.3)
Chloride: 95 mmol/L — ABNORMAL LOW (ref 98–111)
Creatinine, Ser: 0.87 mg/dL (ref 0.61–1.24)
GFR calc Af Amer: >60 mL/min (ref >60)
GFR calc non Af Amer: >60 mL/min (ref >60)
Glucose, Bld: 109 mg/dL — ABNORMAL HIGH (ref 70–99)
Potassium: 4.1 mmol/L (ref 3.5–5.1)
Sodium: 125 mmol/L — ABNORMAL LOW (ref 135–145)

## 2019-03-24 MED ORDER — METFORMIN HCL 500 MG PO TABS
500.0000 mg | ORAL_TABLET | Freq: Every day | ORAL | Status: DC
  Administered 2019-03-24 – 2019-03-25 (×2): 500 mg via ORAL
  Filled 2019-03-24 (×2): qty 1

## 2019-03-24 MED ORDER — CARVEDILOL 12.5 MG PO TABS
12.5000 mg | ORAL_TABLET | Freq: Two times a day (BID) | ORAL | Status: DC
  Administered 2019-03-24 – 2019-03-25 (×2): 12.5 mg via ORAL
  Filled 2019-03-24 (×2): qty 1

## 2019-03-24 MED ORDER — HEPARIN SODIUM (PORCINE) 5000 UNIT/ML IJ SOLN
5000.0000 [IU] | Freq: Three times a day (TID) | INTRAMUSCULAR | Status: DC
  Administered 2019-03-24 – 2019-03-25 (×4): 5000 [IU] via SUBCUTANEOUS
  Filled 2019-03-24 (×4): qty 1

## 2019-03-24 MED ORDER — ALPRAZOLAM 0.5 MG PO TABS
0.5000 mg | ORAL_TABLET | Freq: Two times a day (BID) | ORAL | Status: DC | PRN
  Administered 2019-03-24 – 2019-03-25 (×3): 0.5 mg via ORAL
  Filled 2019-03-24 (×3): qty 1

## 2019-03-24 MED ORDER — LISINOPRIL 20 MG PO TABS
20.0000 mg | ORAL_TABLET | Freq: Every day | ORAL | Status: DC
  Administered 2019-03-24 – 2019-03-25 (×2): 20 mg via ORAL
  Filled 2019-03-24 (×2): qty 1

## 2019-03-24 MED ORDER — HYDROCORTISONE 1 % EX CREA
TOPICAL_CREAM | Freq: Three times a day (TID) | CUTANEOUS | Status: DC | PRN
  Filled 2019-03-24: qty 28

## 2019-03-24 NOTE — Evaluation (Signed)
Physical Therapy Evaluation Patient Details Name: Roy Duke MRN: [id number] DOB: 1950-07-21 Today's Date: 03/24/2019   History of Present Illness  68 year old male with hypertension hyperlipidemia type 2 diabetes anxiety and depression admitted with weakness nausea vomiting constipation poor appetite on 03/22/2019.  He was found to be profoundly hyponatremic with a sodium of 109.  Prior to admission he was on citalopram and hydrochlorothiazide.  Patient admitted by [facility] and followed by nephrology.  He was treated with hypertonic saline.  Clinical Impression  Patient presents with mobility limited due to generalized weakness and imbalance related to acute illness.  Feel he should progress during acute stay with skilled PT to address issues and be able to return home likely without follow up PT.  Will monitor and update recommendations if not progressing as expected.   Follow Up Recommendations No PT follow up    Equipment Recommendations  None recommended by PT    Recommendations for Other Services       Precautions / Restrictions Precautions Precautions: Fall Restrictions Weight Bearing Restrictions: No      Mobility  Bed Mobility Overal bed mobility: Modified Independent                Transfers Overall transfer level: Needs assistance Equipment used: None Transfers: Sit to/from Stand Sit to Stand: Supervision         General transfer comment: assist for safety/balance  Ambulation/Gait Ambulation/Gait assistance: +2 physical assistance Gait Distance (Feet): 100 Feet(x 2) Assistive device: None Gait Pattern/deviations: Step-through pattern;Decreased stride length;Wide base of support;Ataxic     General Gait Details: imbalance with wide BOS and min A for stability  Stairs            Wheelchair Mobility    Modified Rankin (Stroke Patients Only)       Balance Overall balance assessment: Needs assistance   Sitting balance-Leahy Scale:  Good     Standing balance support: No upper extremity supported Standing balance-Leahy Scale: Fair Standing balance comment: able to stand static with S no UE support, but needs assist for ambulation                             Pertinent Vitals/Pain Pain Assessment: Faces Faces Pain Scale: Hurts even more Pain Location: generalized when has intermittent spasms Pain Descriptors / Indicators: Grimacing;Discomfort Pain Intervention(s): Monitored during session    Home Living Family/patient expects to be discharged to:: Private residence Living Arrangements: Spouse/significant other Available Help at Discharge: Family Type of Home: House Home Access: Stairs to enter   Technical brewer of Steps: 2 Home Layout: One level Home Equipment: None      Prior Function Level of Independence: Independent         Comments: retired, 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        Extremity/Trunk Assessment   Upper Extremity Assessment Upper Extremity Assessment: Generalized weakness    Lower Extremity Assessment Lower Extremity Assessment: Generalized weakness       Communication   Communication: No difficulties  Cognition Arousal/Alertness: Awake/alert Behavior During Therapy: WFL for tasks assessed/performed Overall Cognitive Status: Within Functional Limits for tasks assessed                                        General Comments      Exercises  Assessment/Plan    PT Assessment Patient needs continued PT services  PT Problem List Decreased strength;Decreased balance;Decreased coordination;Decreased mobility;Pain       PT Treatment Interventions DME instruction;Therapeutic activities;Balance training;Gait training;Functional mobility training;Therapeutic exercise;Neuromuscular re-education;Patient/family education    PT Goals (Current goals can be found in the Care Plan section)  Acute Rehab PT  Goals Patient Stated Goal: To return to independent PT Goal Formulation: With patient Time For Goal Achievement: 04/07/19 Potential to Achieve Goals: Good    Frequency Min 3X/week   Barriers to discharge        Co-evaluation               AM-PAC PT "6 Clicks" Mobility  Outcome Measure Help needed turning from your back to your side while in a flat bed without using bedrails?: None Help needed moving from lying on your back to sitting on the side of a flat bed without using bedrails?: None Help needed moving to and from a bed to a chair (including a wheelchair)?: A Little Help needed standing up from a chair using your arms (e.g., wheelchair or bedside chair)?: A Little Help needed to walk in hospital room?: A Little Help needed climbing 3-5 steps with a railing? : A Little 6 Click Score: 20    End of Session   Activity Tolerance: Patient limited by fatigue Patient left: in bed;with chair alarm set;with nursing/sitter in room   PT Visit Diagnosis: Unsteadiness on feet (R26.81);Other abnormalities of gait and mobility (R26.89)    Time: 1637-1700 PT Time Calculation (min) (ACUTE ONLY): 23 min   Charges:   PT Evaluation $PT Eval Low Complexity: 1 Low PT Treatments $Gait Training: 8-22 mins        Magda Kiel, [place] [facility] [phone] 03/24/2019   [name] 03/24/2019, 5:47 PM

## 2019-03-24 NOTE — Progress Notes (Addendum)
[facility] NEPHROLOGY PROGRESS NOTE  Assessment/ Plan: Pt is a 68 y.o. yo male with HTN, DM, remote history of alcohol use, consulted for hyponatremia, sodium 109 on admission.  #Hyponatremia, hypovolemic: Multifactorial etiology including due to HCTZ in addition to SSRI, nausea and pain causing vasopressin release.  Urine sodium <10 points towards dehydration. -Sodium level 109 on admission and started 3% saline.  The sodium level initially overcorrected therefore required D5W until 12/16.   Now sodium level improved to 125. Continue fluid restriction.  Discontinue HCTZ. Change sodium lab to every 12 hours.  #Hypertension: BP acceptable.  May be able to introduce lisinopril gradually.  Discontinue HCTZ.  #Hypokalemia: Repleted KCl.  Potassium improved.  Nothing further to add.  Plan as outlined above.  Sign off, plz call back with question.  Subjective: Seen and examined at bedside.  No new event.  No nausea, vomiting, chest pain, shortness of breath. Objective Vital signs in last 24 hours: Vitals:   03/24/19 0329 03/24/19 0331 03/24/19 0411 03/24/19 0500  BP:   132/83 (!) 125/93  Pulse:   89 72  Resp:   18 20  Temp: 98.1 F (36.7 C)     TempSrc: Oral     SpO2:   100% 100%  Weight:  78.4 kg    Height:       Weight change: 0.2 kg  Intake/Output Summary (Last 24 hours) at 03/24/2019 0737 Last data filed at 03/24/2019 0300 Gross per 24 hour  Intake 600 ml  Output 2550 ml  Net -1950 ml       Labs: Basic Metabolic Panel: Recent Labs  Lab 03/22/19 2045 03/23/19 0434 03/23/19 1703 03/23/19 2025 03/24/19 0324  NA 120* 119* 121* 121* 125*  K 3.8 3.7  --   --  4.5  CL 88* 86*  --   --  92*  CO2 24 25  --   --  24  GLUCOSE 99 147*  --   --  123*  BUN 8 7*  --   --  6*  CREATININE 0.95 0.56*  --   --  0.75  CALCIUM 8.2* 8.4*  --   --  8.6*  PHOS 2.5 2.2*  --   --  2.1*   Liver Function Tests: Recent Labs  Lab 03/21/19 1713 03/22/19 2045  03/23/19 0434 03/24/19 0324  AST 25  --   --   --   ALT 18  --   --   --   ALKPHOS 43  --   --   --   BILITOT 0.8  --   --   --   PROT 6.6  --   --   --   ALBUMIN 4.0 3.4* 3.5 3.5   No results for input(s): LIPASE, AMYLASE in the last 168 hours. No results for input(s): AMMONIA in the last 168 hours. CBC: Recent Labs  Lab 03/21/19 1713 03/22/19 0554 03/24/19 0324  WBC 9.1 5.1 5.7  NEUTROABS  --   --  3.9  HGB 12.6* 12.7* 12.8*  HCT 35.6* 36.4* 38.4*  MCV 76.9* 77.6* 80.8  PLT 242 208 207   Cardiac Enzymes: No results for input(s): CKTOTAL, CKMB, CKMBINDEX, TROPONINI in the last 168 hours. CBG: No results for input(s): GLUCAP in the last 168 hours.  Iron Studies: No results for input(s): IRON, TIBC, TRANSFERRIN, FERRITIN in the last 72 hours. Studies/Results: DG Chest Port 1 View  Result Date: 03/23/2019 CLINICAL DATA:  Respiratory failure. Shortness of breath. EXAM:  PORTABLE CHEST 1 VIEW COMPARISON:  03/22/2019 FINDINGS: Right IJ catheter tip projects over the cavoatrial junction. Cardiac enlargement. Decreased lung volumes compared with previous exam. No airspace opacities identified. Blunting of the costophrenic angles may reflect small pleural effusions. IMPRESSION: 1. No change in aeration to the lungs compared with previous exam. 2. Suspect small bilateral pleural effusions. Electronically Signed   By: Kerby Moors M.D.   On: 03/23/2019 09:11    Medications: Infusions:   Scheduled Medications: . atorvastatin  40 mg Oral q1800  . carvedilol  3.125 mg Oral BID WC  . Chlorhexidine Gluconate Cloth  6 each Topical Daily  . heparin  5,000 Units Subcutaneous Q8H  . latanoprost  1 drop Both Eyes QHS  . pantoprazole  40 mg Oral Daily  . senna-docusate  1 tablet Oral BID    have reviewed scheduled and prn medications.  Physical Exam: General: NAD, comfortable Heart:RRR, s1s2 nl, no rub Lungs: Clear b/l, no crackle Abdomen:soft, Non-tender,  non-distended Extremities:No LE edema Neurology: Alert, awake and following commands.  Hajra Port Prasad Barlow Harrison 03/24/2019,7:37 AM  LOS: 2 days  Pager: ID:[phone]

## 2019-03-24 NOTE — Progress Notes (Signed)
PROGRESS NOTE    Roy Duke  [id number] DOB: 06/06/1950 DOA: 03/21/2019 PCP: Patient, No Pcp Per   Brief Narrative: 68 year old male with hypertension hyperlipidemia type 2 diabetes anxiety and depression admitted with weakness nausea vomiting constipation poor appetite on 03/22/2019.  He was found to be profoundly hyponatremic with a sodium of 109.  Prior to admission he was on citalopram and hydrochlorothiazide.  Patient admitted by [facility] and followed by nephrology.  He was treated with hypertonic saline.  Head CT negative.  TRH pickup 03/23/2019 Assessment & Plan:   Active Problems:   Hypernatremia   Acute hyponatremia   Encounter for central line placement   #1 hypovolemic hyponatremia likely secondary to use of SSRI and HCTZ.  Treated with hypertonic saline.    Sodium up to 125 today.  Check sodium every 12.  No IV fluids for now.  Patient is awake alert oriented.  He reports ambulating in the room by himself.  Transfer to medical telemetry floor PT consult  #2 hypertension blood pressure elevated at 163/126 this morning.  Will restart ACE inhibitor.  Continue Coreg increase the dose.  #3 hypokalemia potassium 4.5 today after repletion yesterday.  Had multiple PVCs on the monitor yesterday none today.  #4 possible tramadol withdrawal on Klonopin.  #5 hyperlipidemia continue Lipitor.  #6 glaucoma restart Xalatan eyedrops.  #7 type 2 diabetes restart Glucophage.     Estimated body mass index is 24.8 kg/m as calculated from the following:   Height as of this encounter: 5\' 10"  (1.778 m).   Weight as of this encounter: 78.4 kg.  DVT prophylaxis: Heparin  code Status: Full code  family Communication: None  disposition Plan: Pending clinical improvement Consultants:   Nephrology and PCCM  Procedures: None Antimicrobials: None  Subjective: Patient sitting up in bed eating breakfast much more awake and alert denies any chest pain shortness of breath  headaches  Objective: Vitals:   03/24/19 0411 03/24/19 0500 03/24/19 0753 03/24/19 0820  BP: 132/83 (!) 125/93  (!) 163/126  Pulse: 89 72  96  Resp: 18 20  18   Temp:   97.7 F (36.5 C)   TempSrc:   Oral   SpO2: 100% 100%  100%  Weight:      Height:        Intake/Output Summary (Last 24 hours) at 03/24/2019 1133 Last data filed at 03/24/2019 0758 Gross per 24 hour  Intake 240 ml  Output 2450 ml  Net -2210 ml   Filed Weights   03/22/19 0310 03/23/19 0600 03/24/19 0331  Weight: 78.8 kg 78.2 kg 78.4 kg    Examination:  General exam: Appears calm and comfortable  Respiratory system: Clear to auscultation. Respiratory effort normal. Cardiovascular system: S1 & S2 heard, RRR. No JVD, murmurs, rubs, gallops or clicks. No pedal edema. Gastrointestinal system: Abdomen is nondistended, soft and nontender. No organomegaly or masses felt. Normal bowel sounds heard. Central nervous system: Alert and oriented. No focal neurological deficits. Extremities: Symmetric 5 x 5 power. Skin: No rashes, lesions or ulcers Psychiatry: Judgement and insight appear normal. Mood & affect appropriate.     Data Reviewed: I have personally reviewed following labs and imaging studies  CBC: Recent Labs  Lab 03/21/19 1713 03/22/19 0554 03/24/19 0324  WBC 9.1 5.1 5.7  NEUTROABS  --   --  3.9  HGB 12.6* 12.7* 12.8*  HCT 35.6* 36.4* 38.4*  MCV 76.9* 77.6* 80.8  PLT 242 208 [id number]   Basic Metabolic Panel: Recent Labs  Lab 03/22/19 0554 03/22/19 0955 03/22/19 1439 03/22/19 2045 03/23/19 0434 03/23/19 1703 03/23/19 2025 03/24/19 0324  NA 120* 118* 119* 120* 119* 121* 121* 125*  K 3.3* 3.8 4.0 3.8 3.7  --   --  4.5  CL 84* 84* 84* 88* 86*  --   --  92*  CO2 25 25 26 24 25   --   --  24  GLUCOSE 110* 146* 125* 99 147*  --   --  123*  BUN 6* 8 7* 8 7*  --   --  6*  CREATININE 0.76 0.87 0.81 0.95 0.56*  --   --  0.75  CALCIUM 8.6* 8.3* 8.7* 8.2* 8.4*  --   --  8.6*  MG 2.0  --   --   --   --    --   --   --   PHOS 3.0  --   --  2.5 2.2*  --   --  2.1*   GFR: Estimated Creatinine Clearance: 91.3 mL/min (by C-G formula based on SCr of 0.75 mg/dL). Liver Function Tests: Recent Labs  Lab 03/21/19 1713 03/22/19 2045 03/23/19 0434 03/24/19 0324  AST 25  --   --   --   ALT 18  --   --   --   ALKPHOS 43  --   --   --   BILITOT 0.8  --   --   --   PROT 6.6  --   --   --   ALBUMIN 4.0 3.4* 3.5 3.5   No results for input(s): LIPASE, AMYLASE in the last 168 hours. No results for input(s): AMMONIA in the last 168 hours. Coagulation Profile: No results for input(s): INR, PROTIME in the last 168 hours. Cardiac Enzymes: No results for input(s): CKTOTAL, CKMB, CKMBINDEX, TROPONINI in the last 168 hours. BNP (last 3 results) No results for input(s): PROBNP in the last 8760 hours. HbA1C: No results for input(s): HGBA1C in the last 72 hours. CBG: No results for input(s): GLUCAP in the last 168 hours. Lipid Profile: No results for input(s): CHOL, HDL, LDLCALC, TRIG, CHOLHDL, LDLDIRECT in the last 72 hours. Thyroid Function Tests: Recent Labs    03/22/19 0554  TSH 1.397   Anemia Panel: No results for input(s): VITAMINB12, FOLATE, FERRITIN, TIBC, IRON, RETICCTPCT in the last 72 hours. Sepsis Labs: No results for input(s): PROCALCITON, LATICACIDVEN in the last 168 hours.  Recent Results (from the past 240 hour(s))  SARS CORONAVIRUS 2 (TAT 6-24 HRS) Nasopharyngeal Nasopharyngeal Swab     Status: None   Collection Time: 03/21/19  9:05 PM   Specimen: Nasopharyngeal Swab  Result Value Ref Range Status   SARS Coronavirus 2 NEGATIVE NEGATIVE Final    Comment: (NOTE) SARS-CoV-2 target nucleic acids are NOT DETECTED. The SARS-CoV-2 RNA is generally detectable in upper and lower respiratory specimens during the acute phase of infection. Negative results do not preclude SARS-CoV-2 infection, do not rule out co-infections with other pathogens, and should not be used as the sole basis  for treatment or other patient management decisions. Negative results must be combined with clinical observations, patient history, and epidemiological information. The expected result is Negative. Fact Sheet for Patients: SugarRoll.be Fact Sheet for Healthcare Providers: https://www.[employer].com/ This test is not yet approved or cleared by the [place] FDA and  has been authorized for detection and/or diagnosis of SARS-CoV-2 by FDA under an Emergency Use Authorization (EUA). This EUA will remain  in effect (meaning this test can  be used) for the duration of the COVID-19 declaration under Section 56 4(b)(1) of the Act, 21 U.S.C. section 360bbb-3(b)(1), unless the authorization is terminated or revoked sooner. Performed at [facility], [place] [address] [place], [place] [address]   Respiratory Panel by RT PCR (Flu A&B, Covid) - Nasopharyngeal Swab     Status: None   Collection Time: 03/21/19 11:56 PM   Specimen: Nasopharyngeal Swab  Result Value Ref Range Status   SARS Coronavirus 2 by RT PCR NEGATIVE NEGATIVE Final    Comment: (NOTE) SARS-CoV-2 target nucleic acids are NOT DETECTED. The SARS-CoV-2 RNA is generally detectable in upper respiratoy specimens during the acute phase of infection. The lowest concentration of SARS-CoV-2 viral copies this assay can detect is 131 copies/mL. A negative result does not preclude SARS-Cov-2 infection and should not be used as the sole basis for treatment or other patient management decisions. A negative result may occur with  improper specimen collection/handling, submission of specimen other than nasopharyngeal swab, presence of viral mutation(s) within the areas targeted by this assay, and inadequate number of viral copies (<131 copies/mL). A negative result must be combined with clinical observations, patient history, and epidemiological information. The expected result is  Negative. Fact Sheet for Patients:  PinkCheek.be Fact Sheet for Healthcare Providers:  GravelBags.it This test is not yet ap proved or cleared by the [place] FDA and  has been authorized for detection and/or diagnosis of SARS-CoV-2 by FDA under an Emergency Use Authorization (EUA). This EUA will remain  in effect (meaning this test can be used) for the duration of the COVID-19 declaration under Section 564(b)(1) of the Act, 21 U.S.C. section 360bbb-3(b)(1), unless the authorization is terminated or revoked sooner.    Influenza A by PCR NEGATIVE NEGATIVE Final   Influenza B by PCR NEGATIVE NEGATIVE Final    Comment: (NOTE) The Xpert Xpress SARS-CoV-2/FLU/RSV assay is intended as an aid in  the diagnosis of influenza from Nasopharyngeal swab specimens and  should not be used as a sole basis for treatment. Nasal washings and  aspirates are unacceptable for Xpert Xpress SARS-CoV-2/FLU/RSV  testing. Fact Sheet for Patients: PinkCheek.be Fact Sheet for Healthcare Providers: GravelBags.it This test is not yet approved or cleared by the [place] FDA and  has been authorized for detection and/or diagnosis of SARS-CoV-2 by  FDA under an Emergency Use Authorization (EUA). This EUA will remain  in effect (meaning this test can be used) for the duration of the  Covid-19 declaration under Section 564(b)(1) of the Act, 21  U.S.C. section 360bbb-3(b)(1), unless the authorization is  terminated or revoked. Performed at [facility], [place] [address] [place], [place] [address]   MRSA PCR Screening     Status: None   Collection Time: 03/22/19  3:10 AM   Specimen: Nasopharyngeal  Result Value Ref Range Status   MRSA by PCR NEGATIVE NEGATIVE Final    Comment:        The GeneXpert MRSA Assay (FDA approved for NASAL specimens only), is one component of a comprehensive  MRSA colonization surveillance program. It is not intended to diagnose MRSA infection nor to guide or monitor treatment for MRSA infections. Performed at [facility], [place] [address] [place], [place] [address]          Radiology Studies: DG Chest Port 1 View  Result Date: 03/23/2019 CLINICAL DATA:  Respiratory failure. Shortness of breath. EXAM: PORTABLE CHEST 1 VIEW COMPARISON:  03/22/2019 FINDINGS: Right IJ catheter  tip projects over the cavoatrial junction. Cardiac enlargement. Decreased lung volumes compared with previous exam. No airspace opacities identified. Blunting of the costophrenic angles may reflect small pleural effusions. IMPRESSION: 1. No change in aeration to the lungs compared with previous exam. 2. Suspect small bilateral pleural effusions. Electronically Signed   By: Kerby Moors M.D.   On: 03/23/2019 09:11        Scheduled Meds: . atorvastatin  40 mg Oral q1800  . carvedilol  3.125 mg Oral BID WC  . Chlorhexidine Gluconate Cloth  6 each Topical Daily  . heparin  5,000 Units Subcutaneous Q8H  . latanoprost  1 drop Both Eyes QHS  . pantoprazole  40 mg Oral Daily  . senna-docusate  1 tablet Oral BID   Continuous Infusions:   LOS: 2 days      Georgette Shell, MD [facility]  If 7PM-7AM, please contact night-coverage [email or web address] Password [facility] 03/24/2019, 11:33 AM

## 2019-03-25 LAB — BASIC METABOLIC PANEL
Anion gap: 8 (ref 5–15)
Anion gap: 9 (ref 5–15)
BUN: 7 mg/dL — ABNORMAL LOW (ref 8–23)
BUN: 7 mg/dL — ABNORMAL LOW (ref 8–23)
CO2: 22 mmol/L (ref 22–32)
CO2: 23 mmol/L (ref 22–32)
Calcium: 8.5 mg/dL — ABNORMAL LOW (ref 8.9–10.3)
Calcium: 8.7 mg/dL — ABNORMAL LOW (ref 8.9–10.3)
Chloride: 92 mmol/L — ABNORMAL LOW (ref 98–111)
Chloride: 93 mmol/L — ABNORMAL LOW (ref 98–111)
Creatinine, Ser: 0.89 mg/dL (ref 0.61–1.24)
Creatinine, Ser: 1.02 mg/dL (ref 0.61–1.24)
GFR calc Af Amer: >60 mL/min (ref >60)
GFR calc Af Amer: >60 mL/min (ref >60)
GFR calc non Af Amer: >60 mL/min (ref >60)
GFR calc non Af Amer: >60 mL/min (ref >60)
Glucose, Bld: 133 mg/dL — ABNORMAL HIGH (ref 70–99)
Glucose, Bld: 137 mg/dL — ABNORMAL HIGH (ref 70–99)
Potassium: 3.9 mmol/L (ref 3.5–5.1)
Potassium: 4.3 mmol/L (ref 3.5–5.1)
Sodium: 123 mmol/L — ABNORMAL LOW (ref 135–145)
Sodium: 124 mmol/L — ABNORMAL LOW (ref 135–145)

## 2019-03-25 LAB — SODIUM: Sodium: 127 mmol/L — ABNORMAL LOW (ref 135–145)

## 2019-03-25 MED ORDER — IBUPROFEN 200 MG PO TABS
200.0000 mg | ORAL_TABLET | Freq: Four times a day (QID) | ORAL | Status: DC | PRN
  Administered 2019-03-25: 200 mg via ORAL
  Filled 2019-03-25: qty 1

## 2019-03-25 MED ORDER — LISINOPRIL 20 MG PO TABS: 20.0000 mg | ORAL_TABLET | Freq: Every day | ORAL | 2 refills | Status: DC

## 2019-03-25 MED ORDER — CARVEDILOL 6.25 MG PO TABS: 6.2500 mg | ORAL_TABLET | Freq: Two times a day (BID) | ORAL | 11 refills | Status: DC

## 2019-03-25 NOTE — Care Management Important Message (Signed)
Important Message  Patient Details  Name: Roy Duke MRN: [id number] Date of Birth: [birth year]   Medicare Important Message Given:  Yes     Memory Argue 03/25/2019, 12:57 PM

## 2019-03-25 NOTE — Progress Notes (Addendum)
Pt given discharge instructions and gone over with him and wife. Explained changes to medications and importance of following up with primary MD next week, both agreed. Medications from pharmacy returned to pt.

## 2019-03-25 NOTE — Discharge Summary (Signed)
Physician Discharge Summary  Roy Duke [id number] DOB: 20-Jan-1951 DOA: 03/21/2019  PCP: Patient, No Pcp Per  Admit date: 03/21/2019 Discharge date: 03/25/2019  Admitted From: Home Disposition: Home  Recommendations for Outpatient Follow-up:  1. Follow up with PCP in 1 to 2 weeks 2. Please obtain BMP/CBC in 4 days   Home Health outpatient physical therapy Equipment/Devices: None Discharge Condition: Stable and improved CODE STATUS: Full code Diet recommendation: Cardiac Brief/Interim Summary:68 year old male with hypertension hyperlipidemia type 2 diabetes anxiety and depression admitted with weakness nausea vomiting constipation poor appetite on 03/22/2019. He was found to be profoundly hyponatremic with a sodium of 109. Prior to admission he was on citalopram and hydrochlorothiazide. Patient admitted by [facility] and followed by nephrology. He was treated with hypertonic saline. Head CT negative.  Discharge Diagnoses:  Active Problems:   Hypernatremia   Acute hyponatremia   Encounter for central line placement  #1 hypovolemic hyponatremia likely secondary to use of SSRI and HCTZ. Treated with hypertonic saline.   Sodium up to 128 on the day of discharge.  Patient is awake alert oriented participate with physical therapy.  PT recommends outpatient for PT.  #2 hypertension-continue lisinopril 20 mg daily.  Coreg added for better control of his blood pressure.  Hydrochlorothiazide has been stopped due to hyponatremia.  #3 hypokalemia repleated.    #4 possible tramadol withdrawal was treated with Klonopin.  #5 hyperlipidemia continue Lipitor.  #6 glaucoma restart Xalatan eyedrops.  #7 type 2 diabetes continue Glucophage.    Estimated body mass index is 24.8 kg/m as calculated from the following:   Height as of this encounter: 5\' 10"  (1.778 m).   Weight as of this encounter: 78.4 kg.  Discharge Instructions  Discharge Instructions    Diet - low sodium  heart healthy   Complete by: As directed    Discharge instructions   Complete by: As directed    Please make sure to check basic metabolic panel early next week, you had low sodium during the hospital stay.  I have stopped your hydrochlorothiazide and Celexa please do not take them at home.   Increase activity slowly   Complete by: As directed      Allergies as of 03/25/2019      Reactions   Codeine Nausea Only   "makes me feel weird"   Penicillins Other (See Comments)   States a long time ago, does not recall reaction       Medication List    STOP taking these medications   citalopram 10 MG tablet Commonly known as: CELEXA   lisinopril-hydrochlorothiazide 20-25 MG tablet Commonly known as: ZESTORETIC     TAKE these medications   atorvastatin 40 MG tablet Commonly known as: LIPITOR Take 40 mg by mouth daily at 6 PM.   carvedilol 6.25 MG tablet Commonly known as: Coreg Take 1 tablet (6.25 mg total) by mouth 2 (two) times daily.   latanoprost 0.005 % ophthalmic solution Commonly known as: XALATAN Place 1 drop into both eyes at bedtime.   lisinopril 20 MG tablet Commonly known as: ZESTRIL Take 1 tablet (20 mg total) by mouth daily. Start taking on: March 26, 2019   metFORMIN 500 MG tablet Commonly known as: GLUCOPHAGE Take 500 mg by mouth daily.   omeprazole 40 MG capsule Commonly known as: PRILOSEC Take 40 mg by mouth daily.   traMADol 50 MG tablet Commonly known as: ULTRAM Take 100 mg by mouth every 6 (six) hours.       Allergies  Allergen Reactions  . Codeine Nausea Only    "makes me feel weird"  . Penicillins Other (See Comments)    States a long time ago, does not recall reaction     Consultations: PCCM  Procedures/Studies: CT Head Wo Contrast  Result Date: 03/21/2019 CLINICAL DATA:  Worsening headache. EXAM: CT HEAD WITHOUT CONTRAST TECHNIQUE: Contiguous axial images were obtained from the base of the skull through the vertex without  intravenous contrast. COMPARISON:  None. FINDINGS: Brain: No evidence of acute infarction, hemorrhage, hydrocephalus, extra-axial collection or mass lesion/mass effect. Vascular: No hyperdense vessel or unexpected calcification. Skull: Normal. Negative for fracture or focal lesion. Sinuses/Orbits: No acute finding. Other: None. IMPRESSION: No acute intracranial abnormality. Electronically Signed   By: Constance Holster M.D.   On: 03/21/2019 21:37   DG Chest Port 1 View  Result Date: 03/23/2019 CLINICAL DATA:  Respiratory failure. Shortness of breath. EXAM: PORTABLE CHEST 1 VIEW COMPARISON:  03/22/2019 FINDINGS: Right IJ catheter tip projects over the cavoatrial junction. Cardiac enlargement. Decreased lung volumes compared with previous exam. No airspace opacities identified. Blunting of the costophrenic angles may reflect small pleural effusions. IMPRESSION: 1. No change in aeration to the lungs compared with previous exam. 2. Suspect small bilateral pleural effusions. Electronically Signed   By: Kerby Moors M.D.   On: 03/23/2019 09:11   DG Chest Port 1 View  Result Date: 03/22/2019 CLINICAL DATA:  Central line placement. EXAM: PORTABLE CHEST 1 VIEW COMPARISON:  Radiograph 09/07/2004 FINDINGS: Right internal jugular central venous catheter tip in the upper SVC. No pneumothorax. Normal heart size and mediastinal contours. Mild hyperinflation. No focal airspace disease, pleural fluid or pulmonary edema. No acute osseous abnormalities. IMPRESSION: 1. Right internal jugular central venous catheter tip in the upper SVC. No pneumothorax. 2. Mild hyperinflation, can be seen with bronchitis, asthma, or COPD. Electronically Signed   By: Keith Rake M.D.   On: 03/22/2019 02:10    (Echo, Carotid, EGD, Colonoscopy, ERCP)    Subjective: Patient is resting in bed no new complaints ambulated with physical therapy denies nausea vomiting chest pain shortness of breath or headaches.  Discharge  Exam: Vitals:   03/25/19 0328 03/25/19 0859  BP: 128/70 138/80  Pulse: 72 90  Resp: 16 18  Temp: 98 F (36.7 C) 98.1 F (36.7 C)  SpO2: 99% 94%   Vitals:   03/24/19 1640 03/24/19 1933 03/25/19 0328 03/25/19 0859  BP: (!) 141/91 119/60 128/70 138/80  Pulse: 81 60 72 90  Resp: 16 17 16 18   Temp: 98.1 F (36.7 C) 98.2 F (36.8 C) 98 F (36.7 C) 98.1 F (36.7 C)  TempSrc: Oral Oral Oral Oral  SpO2: 100% 100% 99% 94%  Weight:      Height:        General: Pt is alert, awake, not in acute distress Cardiovascular: RRR, S1/S2 +, no rubs, no gallops Respiratory: CTA bilaterally, no wheezing, no rhonchi Abdominal: Soft, NT, ND, bowel sounds + Extremities: no edema, no cyanosis    The results of significant diagnostics from this hospitalization (including imaging, microbiology, ancillary and laboratory) are listed below for reference.     Microbiology: Recent Results (from the past 240 hour(s))  SARS CORONAVIRUS 2 (TAT 6-24 HRS) Nasopharyngeal Nasopharyngeal Swab     Status: None   Collection Time: 03/21/19  9:05 PM   Specimen: Nasopharyngeal Swab  Result Value Ref Range Status   SARS Coronavirus 2 NEGATIVE NEGATIVE Final    Comment: (NOTE) SARS-CoV-2 target nucleic acids are  NOT DETECTED. The SARS-CoV-2 RNA is generally detectable in upper and lower respiratory specimens during the acute phase of infection. Negative results do not preclude SARS-CoV-2 infection, do not rule out co-infections with other pathogens, and should not be used as the sole basis for treatment or other patient management decisions. Negative results must be combined with clinical observations, patient history, and epidemiological information. The expected result is Negative. Fact Sheet for Patients: SugarRoll.be Fact Sheet for Healthcare Providers: https://www.woods-Yitzel Shasteen.com/ This test is not yet approved or cleared by the [place] FDA and  has been  authorized for detection and/or diagnosis of SARS-CoV-2 by FDA under an Emergency Use Authorization (EUA). This EUA will remain  in effect (meaning this test can be used) for the duration of the COVID-19 declaration under Section 56 4(b)(1) of the Act, 21 U.S.C. section 360bbb-3(b)(1), unless the authorization is terminated or revoked sooner. Performed at [facility], [place] [address] [place], [place] [address]   Respiratory Panel by RT PCR (Flu A&B, Covid) - Nasopharyngeal Swab     Status: None   Collection Time: 03/21/19 11:56 PM   Specimen: Nasopharyngeal Swab  Result Value Ref Range Status   SARS Coronavirus 2 by RT PCR NEGATIVE NEGATIVE Final    Comment: (NOTE) SARS-CoV-2 target nucleic acids are NOT DETECTED. The SARS-CoV-2 RNA is generally detectable in upper respiratoy specimens during the acute phase of infection. The lowest concentration of SARS-CoV-2 viral copies this assay can detect is 131 copies/mL. A negative result does not preclude SARS-Cov-2 infection and should not be used as the sole basis for treatment or other patient management decisions. A negative result may occur with  improper specimen collection/handling, submission of specimen other than nasopharyngeal swab, presence of viral mutation(s) within the areas targeted by this assay, and inadequate number of viral copies (<131 copies/mL). A negative result must be combined with clinical observations, patient history, and epidemiological information. The expected result is Negative. Fact Sheet for Patients:  PinkCheek.be Fact Sheet for Healthcare Providers:  GravelBags.it This test is not yet ap proved or cleared by the [place] FDA and  has been authorized for detection and/or diagnosis of SARS-CoV-2 by FDA under an Emergency Use Authorization (EUA). This EUA will remain  in effect (meaning this test can be used) for the duration of  the COVID-19 declaration under Section 564(b)(1) of the Act, 21 U.S.C. section 360bbb-3(b)(1), unless the authorization is terminated or revoked sooner.    Influenza A by PCR NEGATIVE NEGATIVE Final   Influenza B by PCR NEGATIVE NEGATIVE Final    Comment: (NOTE) The Xpert Xpress SARS-CoV-2/FLU/RSV assay is intended as an aid in  the diagnosis of influenza from Nasopharyngeal swab specimens and  should not be used as a sole basis for treatment. Nasal washings and  aspirates are unacceptable for Xpert Xpress SARS-CoV-2/FLU/RSV  testing. Fact Sheet for Patients: PinkCheek.be Fact Sheet for Healthcare Providers: GravelBags.it This test is not yet approved or cleared by the [place] FDA and  has been authorized for detection and/or diagnosis of SARS-CoV-2 by  FDA under an Emergency Use Authorization (EUA). This EUA will remain  in effect (meaning this test can be used) for the duration of the  Covid-19 declaration under Section 564(b)(1) of the Act, 21  U.S.C. section 360bbb-3(b)(1), unless the authorization is  terminated or revoked. Performed at [facility], [place] [address], [place], [place] [address]   MRSA PCR Screening     Status: None   Collection Time:  03/22/19  3:10 AM   Specimen: Nasopharyngeal  Result Value Ref Range Status   MRSA by PCR NEGATIVE NEGATIVE Final    Comment:        The GeneXpert MRSA Assay (FDA approved for NASAL specimens only), is one component of a comprehensive MRSA colonization surveillance program. It is not intended to diagnose MRSA infection nor to guide or monitor treatment for MRSA infections. Performed at [facility], [place] [address], [place], [place] [address]      Labs: BNP (last 3 results) No results for input(s): BNP in the last 8760 hours. Basic Metabolic Panel: Recent Labs  Lab 03/22/19 0554 03/22/19 2045 03/23/19 0434 03/24/19 0324 03/24/19 1315  03/24/19 1641 03/25/19 0008 03/25/19 0441  NA 120* 120* 119* 125* 125* 124* 123* 127*  K 3.3* 3.8 3.7 4.5 4.1  --  3.9  --   CL 84* 88* 86* 92* 95*  --  93*  --   CO2 25 24 25 24 22   --  22  --   GLUCOSE 110* 99 147* 123* 109*  --  137*  --   BUN 6* 8 7* 6* 7*  --  7*  --   CREATININE 0.76 0.95 0.56* 0.75 0.87  --  0.89  --   CALCIUM 8.6* 8.2* 8.4* 8.6* 8.6*  --  8.5*  --   MG 2.0  --   --   --   --   --   --   --   PHOS 3.0 2.5 2.2* 2.1*  --   --   --   --    Liver Function Tests: Recent Labs  Lab 03/21/19 1713 03/22/19 2045 03/23/19 0434 03/24/19 0324  AST 25  --   --   --   ALT 18  --   --   --   ALKPHOS 43  --   --   --   BILITOT 0.8  --   --   --   PROT 6.6  --   --   --   ALBUMIN 4.0 3.4* 3.5 3.5   No results for input(s): LIPASE, AMYLASE in the last 168 hours. No results for input(s): AMMONIA in the last 168 hours. CBC: Recent Labs  Lab 03/21/19 1713 03/22/19 0554 03/24/19 0324  WBC 9.1 5.1 5.7  NEUTROABS  --   --  3.9  HGB 12.6* 12.7* 12.8*  HCT 35.6* 36.4* 38.4*  MCV 76.9* 77.6* 80.8  PLT 242 208 207   Cardiac Enzymes: No results for input(s): CKTOTAL, CKMB, CKMBINDEX, TROPONINI in the last 168 hours. BNP: Invalid input(s): POCBNP CBG: No results for input(s): GLUCAP in the last 168 hours. D-Dimer No results for input(s): DDIMER in the last 72 hours. Hgb A1c No results for input(s): HGBA1C in the last 72 hours. Lipid Profile No results for input(s): CHOL, HDL, LDLCALC, TRIG, CHOLHDL, LDLDIRECT in the last 72 hours. Thyroid function studies No results for input(s): TSH, T4TOTAL, T3FREE, THYROIDAB in the last 72 hours.  Invalid input(s): FREET3 Anemia work up No results for input(s): VITAMINB12, FOLATE, FERRITIN, TIBC, IRON, RETICCTPCT in the last 72 hours. Urinalysis    Component Value Date/Time   COLORURINE COLORLESS (A) 03/21/2019 2345   APPEARANCEUR CLEAR 03/21/2019 2345   LABSPEC 1.002 (L) 03/21/2019 2345   PHURINE 7.0 03/21/2019 [place] 03/21/2019 2345   HGBUR NEGATIVE 03/21/2019 2345   BILIRUBINUR NEGATIVE 03/21/2019 [place] 03/21/2019 2345   PROTEINUR NEGATIVE  03/21/2019 2345   NITRITE NEGATIVE 03/21/2019 2345   LEUKOCYTESUR NEGATIVE 03/21/2019 2345   Sepsis Labs Invalid input(s): PROCALCITONIN,  WBC,  LACTICIDVEN Microbiology Recent Results (from the past 240 hour(s))  SARS CORONAVIRUS 2 (TAT 6-24 HRS) Nasopharyngeal Nasopharyngeal Swab     Status: None   Collection Time: 03/21/19  9:05 PM   Specimen: Nasopharyngeal Swab  Result Value Ref Range Status   SARS Coronavirus 2 NEGATIVE NEGATIVE Final    Comment: (NOTE) SARS-CoV-2 target nucleic acids are NOT DETECTED. The SARS-CoV-2 RNA is generally detectable in upper and lower respiratory specimens during the acute phase of infection. Negative results do not preclude SARS-CoV-2 infection, do not rule out co-infections with other pathogens, and should not be used as the sole basis for treatment or other patient management decisions. Negative results must be combined with clinical observations, patient history, and epidemiological information. The expected result is Negative. Fact Sheet for Patients: SugarRoll.be Fact Sheet for Healthcare Providers: https://www.woods-Krystale Rinkenberger.com/ This test is not yet approved or cleared by the [place] FDA and  has been authorized for detection and/or diagnosis of SARS-CoV-2 by FDA under an Emergency Use Authorization (EUA). This EUA will remain  in effect (meaning this test can be used) for the duration of the COVID-19 declaration under Section 56 4(b)(1) of the Act, 21 U.S.C. section 360bbb-3(b)(1), unless the authorization is terminated or revoked sooner. Performed at [facility], [place] [address], [place], [place] [address]   Respiratory Panel by RT PCR (Flu A&B, Covid) - Nasopharyngeal Swab     Status: None   Collection Time: 03/21/19  11:56 PM   Specimen: Nasopharyngeal Swab  Result Value Ref Range Status   SARS Coronavirus 2 by RT PCR NEGATIVE NEGATIVE Final    Comment: (NOTE) SARS-CoV-2 target nucleic acids are NOT DETECTED. The SARS-CoV-2 RNA is generally detectable in upper respiratoy specimens during the acute phase of infection. The lowest concentration of SARS-CoV-2 viral copies this assay can detect is 131 copies/mL. A negative result does not preclude SARS-Cov-2 infection and should not be used as the sole basis for treatment or other patient management decisions. A negative result may occur with  improper specimen collection/handling, submission of specimen other than nasopharyngeal swab, presence of viral mutation(s) within the areas targeted by this assay, and inadequate number of viral copies (<131 copies/mL). A negative result must be combined with clinical observations, patient history, and epidemiological information. The expected result is Negative. Fact Sheet for Patients:  PinkCheek.be Fact Sheet for Healthcare Providers:  GravelBags.it This test is not yet ap proved or cleared by the [place] FDA and  has been authorized for detection and/or diagnosis of SARS-CoV-2 by FDA under an Emergency Use Authorization (EUA). This EUA will remain  in effect (meaning this test can be used) for the duration of the COVID-19 declaration under Section 564(b)(1) of the Act, 21 U.S.C. section 360bbb-3(b)(1), unless the authorization is terminated or revoked sooner.    Influenza A by PCR NEGATIVE NEGATIVE Final   Influenza B by PCR NEGATIVE NEGATIVE Final    Comment: (NOTE) The Xpert Xpress SARS-CoV-2/FLU/RSV assay is intended as an aid in  the diagnosis of influenza from Nasopharyngeal swab specimens and  should not be used as a sole basis for treatment. Nasal washings and  aspirates are unacceptable for Xpert Xpress SARS-CoV-2/FLU/RSV   testing. Fact Sheet for Patients: PinkCheek.be Fact Sheet for Healthcare Providers: GravelBags.it This test is not yet approved or cleared by the [place] and  has been authorized for detection and/or diagnosis of SARS-CoV-2 by  FDA under an Emergency Use Authorization (EUA). This EUA will remain  in effect (meaning this test can be used) for the duration of the  Covid-19 declaration under Section 564(b)(1) of the Act, 21  U.S.C. section 360bbb-3(b)(1), unless the authorization is  terminated or revoked. Performed at [facility], [place] [address] [place], [place] [address]   MRSA PCR Screening     Status: None   Collection Time: 03/22/19  3:10 AM   Specimen: Nasopharyngeal  Result Value Ref Range Status   MRSA by PCR NEGATIVE NEGATIVE Final    Comment:        The GeneXpert MRSA Assay (FDA approved for NASAL specimens only), is one component of a comprehensive MRSA colonization surveillance program. It is not intended to diagnose MRSA infection nor to guide or monitor treatment for MRSA infections. Performed at [facility], [place] [address], [place], [place] [address]      Time coordinating discharge:  38 minutes  SIGNED:   Georgette Shell, MD  [facility] 03/25/2019, 11:12 AM Pager   If 7PM-7AM, please contact night-coverage [email or web address] Password [email or web address]

## 2019-03-25 NOTE — Progress Notes (Signed)
Physical Therapy Treatment Patient Details Name: Roy Duke MRN: [id number] DOB: 10/27/1950 Today's Date: 03/25/2019    History of Present Illness 68 year old male with hypertension hyperlipidemia type 2 diabetes anxiety and depression admitted with weakness nausea vomiting constipation poor appetite on 03/22/2019.  He was found to be profoundly hyponatremic with a sodium of 109.  Prior to admission he was on citalopram and hydrochlorothiazide.  Patient admitted by [facility] and followed by nephrology.  He was treated with hypertonic saline.    PT Comments    Pt received resting in bed however easily arousable and agreeable to PT. Pt required min guard assist for OOB mobility due to mild unsteadiness with ambulation and stair training.  Pt ambulated in hallway 500+ ft without AD however had a couple instances of unsteadiness especially with turns. Pt ascended/descended 2 stairs without HR to mimic STE at home with one instance of mild unsteadiness. Pt reporting that his wife will be at home with him however she does work part time MWF. Pt reporting he is still working 40 hours a week and has to be one his feet a lot at work and on [employer]. Pt performed seated and standing LE therex with pt encouraged to perform exercises on his own to improve strength. Pt presents with decreased strength, balance and endurance limiting functional mobility. Pt would benefit from continued skilled acute PT to improve deficits and decrease fall risk. Recommendation for outpatient PT to improve balance, strength and decrease fall risk in order to maximize return to PLOF.   Follow Up Recommendations  Outpatient PT     Equipment Recommendations  None recommended by PT    Recommendations for Other Services       Precautions / Restrictions Precautions Precautions: Fall Restrictions Weight Bearing Restrictions: No    Mobility  Bed Mobility Overal bed mobility: Modified Independent             General  bed mobility comments: pt safely able to get EOB  Transfers Overall transfer level: Needs assistance Equipment used: None Transfers: Sit to/from Stand Sit to Stand: Min guard         General transfer comment: min guard for safety, slight unsteadiness with initial standing  Ambulation/Gait Ambulation/Gait assistance: Min guard Gait Distance (Feet): 500 Feet Assistive device: None Gait Pattern/deviations: Step-through pattern;Decreased stride length;Wide base of support Gait velocity: decreased   General Gait Details: pt ambulated in hallway without AD, min guard for safety, pt had 2-3 instances of unsteadiness once during a turn   Stairs Stairs: Yes Stairs assistance: Min guard Stair Management: No rails;Forwards Number of Stairs: 2 General stair comments: pt ascended/descended 2 steps without HR to mimic his home STE, min guard for safety, pt had one instance of unsteadiness on stairs   Wheelchair Mobility    Modified Rankin (Stroke Patients Only)       Balance Overall balance assessment: Needs assistance   Sitting balance-Leahy Scale: Good       Standing balance-Leahy Scale: Fair Standing balance comment: able to maintain balance without UE support however couple instances of unsteadiness during dynamic activities and ambulation                            Cognition Arousal/Alertness: Awake/alert Behavior During Therapy: WFL for tasks assessed/performed Overall Cognitive Status: Within Functional Limits for tasks assessed  Exercises Total Joint Exercises Standing Hip Extension: AROM;Both;10 reps;Standing General Exercises - Lower Extremity Long Arc Quad: AROM;Both;10 reps Hip ABduction/ADduction: AROM;Both;10 reps;Standing Hip Flexion/Marching: AROM;Both;10 reps;Standing;Seated Toe Raises: AROM;Both;10 reps;Seated Heel Raises: AROM;10 reps;Seated;Both    General Comments         Pertinent Vitals/Pain Pain Assessment: No/denies pain    Home Living                      Prior Function            PT Goals (current goals can now be found in the care plan section) Progress towards PT goals: Progressing toward goals    Frequency    Min 3X/week      PT Plan Discharge plan needs to be updated    Co-evaluation              AM-PAC PT "6 Clicks" Mobility   Outcome Measure  Help needed turning from your back to your side while in a flat bed without using bedrails?: None Help needed moving from lying on your back to sitting on the side of a flat bed without using bedrails?: None Help needed moving to and from a bed to a chair (including a wheelchair)?: A Little Help needed standing up from a chair using your arms (e.g., wheelchair or bedside chair)?: A Little Help needed to walk in hospital room?: A Little Help needed climbing 3-5 steps with a railing? : A Little 6 Click Score: 20    End of Session Equipment Utilized During Treatment: Gait belt Activity Tolerance: Patient tolerated treatment well Patient left: in bed;with call bell/phone within reach Nurse Communication: Mobility status PT Visit Diagnosis: Unsteadiness on feet (R26.81);Other abnormalities of gait and mobility (R26.89)     Time: 0950-1008 PT Time Calculation (min) (ACUTE ONLY): 18 min  Charges:  $Therapeutic Exercise: 8-22 mins                     Roy Duke PT, DPT 10:51 AM,03/25/19    Roy Duke Roy Duke 03/25/2019, 10:47 AM

## 2019-05-21 DIAGNOSIS — I10 Essential (primary) hypertension: Secondary | ICD-10-CM

## 2019-05-21 DIAGNOSIS — I509 Heart failure, unspecified: Secondary | ICD-10-CM

## 2019-05-21 DIAGNOSIS — R0602 Shortness of breath: Secondary | ICD-10-CM

## 2019-05-21 DIAGNOSIS — I5020 Unspecified systolic (congestive) heart failure: Secondary | ICD-10-CM | POA: Diagnosis not present

## 2019-05-21 DIAGNOSIS — J90 Pleural effusion, not elsewhere classified: Secondary | ICD-10-CM

## 2019-05-21 DIAGNOSIS — E119 Type 2 diabetes mellitus without complications: Secondary | ICD-10-CM

## 2019-05-22 DIAGNOSIS — I340 Nonrheumatic mitral (valve) insufficiency: Secondary | ICD-10-CM

## 2019-05-22 DIAGNOSIS — I5020 Unspecified systolic (congestive) heart failure: Secondary | ICD-10-CM

## 2019-05-22 DIAGNOSIS — R0602 Shortness of breath: Secondary | ICD-10-CM | POA: Diagnosis not present

## 2019-05-22 DIAGNOSIS — E119 Type 2 diabetes mellitus without complications: Secondary | ICD-10-CM

## 2019-05-22 DIAGNOSIS — I509 Heart failure, unspecified: Secondary | ICD-10-CM | POA: Diagnosis not present

## 2019-05-22 DIAGNOSIS — I10 Essential (primary) hypertension: Secondary | ICD-10-CM | POA: Diagnosis not present

## 2019-05-22 DIAGNOSIS — I361 Nonrheumatic tricuspid (valve) insufficiency: Secondary | ICD-10-CM

## 2019-05-22 DIAGNOSIS — E7800 Pure hypercholesterolemia, unspecified: Secondary | ICD-10-CM

## 2019-05-22 DIAGNOSIS — I110 Hypertensive heart disease with heart failure: Secondary | ICD-10-CM

## 2019-05-22 DIAGNOSIS — J90 Pleural effusion, not elsewhere classified: Secondary | ICD-10-CM

## 2019-05-23 DIAGNOSIS — I110 Hypertensive heart disease with heart failure: Secondary | ICD-10-CM | POA: Diagnosis not present

## 2019-05-23 DIAGNOSIS — R0602 Shortness of breath: Secondary | ICD-10-CM | POA: Diagnosis not present

## 2019-05-23 DIAGNOSIS — E7800 Pure hypercholesterolemia, unspecified: Secondary | ICD-10-CM | POA: Diagnosis not present

## 2019-05-23 DIAGNOSIS — I509 Heart failure, unspecified: Secondary | ICD-10-CM | POA: Diagnosis not present

## 2019-05-23 DIAGNOSIS — I10 Essential (primary) hypertension: Secondary | ICD-10-CM | POA: Diagnosis not present

## 2019-05-23 DIAGNOSIS — J90 Pleural effusion, not elsewhere classified: Secondary | ICD-10-CM | POA: Diagnosis not present

## 2019-05-23 DIAGNOSIS — E119 Type 2 diabetes mellitus without complications: Secondary | ICD-10-CM | POA: Diagnosis not present

## 2019-05-23 DIAGNOSIS — I5020 Unspecified systolic (congestive) heart failure: Secondary | ICD-10-CM | POA: Diagnosis not present

## 2019-05-24 DIAGNOSIS — I110 Hypertensive heart disease with heart failure: Secondary | ICD-10-CM | POA: Diagnosis not present

## 2019-05-24 DIAGNOSIS — R0602 Shortness of breath: Secondary | ICD-10-CM | POA: Diagnosis not present

## 2019-05-24 DIAGNOSIS — E7800 Pure hypercholesterolemia, unspecified: Secondary | ICD-10-CM | POA: Diagnosis not present

## 2019-05-24 DIAGNOSIS — I10 Essential (primary) hypertension: Secondary | ICD-10-CM | POA: Diagnosis not present

## 2019-05-24 DIAGNOSIS — I509 Heart failure, unspecified: Secondary | ICD-10-CM | POA: Diagnosis not present

## 2019-05-24 DIAGNOSIS — J90 Pleural effusion, not elsewhere classified: Secondary | ICD-10-CM | POA: Diagnosis not present

## 2019-05-24 DIAGNOSIS — E119 Type 2 diabetes mellitus without complications: Secondary | ICD-10-CM | POA: Diagnosis not present

## 2019-05-24 DIAGNOSIS — I5020 Unspecified systolic (congestive) heart failure: Secondary | ICD-10-CM | POA: Diagnosis not present

## 2019-05-25 ENCOUNTER — Encounter (HOSPITAL_COMMUNITY): Payer: Self-pay | Admitting: Internal Medicine

## 2019-05-25 ENCOUNTER — Inpatient Hospital Stay (HOSPITAL_COMMUNITY)
Admission: AD | Admit: 2019-05-25 | Discharge: 2019-05-29 | DRG: 287 | Disposition: A | Discharge status: Home / Self Care | Payer: Medicare HMO | Source: Other Acute Inpatient Hospital | Attending: Internal Medicine | Admitting: Internal Medicine

## 2019-05-25 DIAGNOSIS — E119 Type 2 diabetes mellitus without complications: Secondary | ICD-10-CM | POA: Diagnosis present

## 2019-05-25 DIAGNOSIS — I472 Ventricular tachycardia: Secondary | ICD-10-CM | POA: Diagnosis present

## 2019-05-25 DIAGNOSIS — J45909 Unspecified asthma, uncomplicated: Secondary | ICD-10-CM | POA: Diagnosis present

## 2019-05-25 DIAGNOSIS — E7800 Pure hypercholesterolemia, unspecified: Secondary | ICD-10-CM | POA: Diagnosis present

## 2019-05-25 DIAGNOSIS — I493 Ventricular premature depolarization: Secondary | ICD-10-CM | POA: Diagnosis present

## 2019-05-25 DIAGNOSIS — I5023 Acute on chronic systolic (congestive) heart failure: Secondary | ICD-10-CM | POA: Diagnosis present

## 2019-05-25 DIAGNOSIS — I428 Other cardiomyopathies: Secondary | ICD-10-CM | POA: Diagnosis present

## 2019-05-25 DIAGNOSIS — K761 Chronic passive congestion of liver: Secondary | ICD-10-CM | POA: Diagnosis present

## 2019-05-25 DIAGNOSIS — I110 Hypertensive heart disease with heart failure: Secondary | ICD-10-CM | POA: Diagnosis present

## 2019-05-25 DIAGNOSIS — I2510 Atherosclerotic heart disease of native coronary artery without angina pectoris: Secondary | ICD-10-CM | POA: Diagnosis present

## 2019-05-25 DIAGNOSIS — Z79899 Other long term (current) drug therapy: Secondary | ICD-10-CM

## 2019-05-25 DIAGNOSIS — Z7984 Long term (current) use of oral hypoglycemic drugs: Secondary | ICD-10-CM | POA: Diagnosis not present

## 2019-05-25 DIAGNOSIS — I5020 Unspecified systolic (congestive) heart failure: Secondary | ICD-10-CM | POA: Diagnosis not present

## 2019-05-25 DIAGNOSIS — I5021 Acute systolic (congestive) heart failure: Secondary | ICD-10-CM | POA: Diagnosis present

## 2019-05-25 DIAGNOSIS — F17210 Nicotine dependence, cigarettes, uncomplicated: Secondary | ICD-10-CM | POA: Diagnosis present

## 2019-05-25 DIAGNOSIS — I10 Essential (primary) hypertension: Secondary | ICD-10-CM | POA: Diagnosis present

## 2019-05-25 DIAGNOSIS — E785 Hyperlipidemia, unspecified: Secondary | ICD-10-CM | POA: Diagnosis present

## 2019-05-25 DIAGNOSIS — Z20822 Contact with and (suspected) exposure to covid-19: Secondary | ICD-10-CM | POA: Diagnosis present

## 2019-05-25 LAB — COMPREHENSIVE METABOLIC PANEL
ALT: 39 U/L (ref 0–44)
AST: 31 U/L (ref 15–41)
Albumin: 4.4 g/dL (ref 3.5–5.0)
Alkaline Phosphatase: 52 U/L (ref 38–126)
Anion gap: 11 (ref 5–15)
BUN: 16 mg/dL (ref 8–23)
CO2: 21 mmol/L — ABNORMAL LOW (ref 22–32)
Calcium: 9.6 mg/dL (ref 8.9–10.3)
Chloride: 101 mmol/L (ref 98–111)
Creatinine, Ser: 1.04 mg/dL (ref 0.61–1.24)
GFR calc Af Amer: >60 mL/min (ref >60)
GFR calc non Af Amer: >60 mL/min (ref >60)
Glucose, Bld: 127 mg/dL — ABNORMAL HIGH (ref 70–99)
Potassium: 4.9 mmol/L (ref 3.5–5.1)
Sodium: 133 mmol/L — ABNORMAL LOW (ref 135–145)
Total Bilirubin: 1.1 mg/dL (ref 0.3–1.2)
Total Protein: 7.1 g/dL (ref 6.5–8.1)

## 2019-05-25 LAB — CBC WITH DIFFERENTIAL/PLATELET
Abs Immature Granulocytes: 0.02 10*3/uL (ref 0.00–0.07)
Basophils Absolute: 0 10*3/uL (ref 0.0–0.1)
Basophils Relative: 1 %
Eosinophils Absolute: 0 10*3/uL (ref 0.0–0.5)
Eosinophils Relative: 0 %
HCT: 39.6 % (ref 39.0–52.0)
Hemoglobin: 12.2 g/dL — ABNORMAL LOW (ref 13.0–17.0)
Immature Granulocytes: 0 %
Lymphocytes Relative: 12 %
Lymphs Abs: 0.7 10*3/uL (ref 0.7–4.0)
MCH: 25 pg — ABNORMAL LOW (ref 26.0–34.0)
MCHC: 30.8 g/dL (ref 30.0–36.0)
MCV: 81.1 fL (ref 80.0–100.0)
Monocytes Absolute: 0.7 10*3/uL (ref 0.1–1.0)
Monocytes Relative: 12 %
Neutro Abs: 4.3 10*3/uL (ref 1.7–7.7)
Neutrophils Relative %: 75 %
Platelets: 307 10*3/uL (ref 150–400)
RBC: 4.88 MIL/uL (ref 4.22–5.81)
RDW: 16.2 % — ABNORMAL HIGH (ref 11.5–15.5)
WBC: 5.7 10*3/uL (ref 4.0–10.5)
nRBC: 0 % (ref 0.0–0.2)

## 2019-05-25 LAB — BRAIN NATRIURETIC PEPTIDE: B Natriuretic Peptide: 1679.1 pg/mL — ABNORMAL HIGH (ref 0.0–100.0)

## 2019-05-25 LAB — MAGNESIUM: Magnesium: 2.6 mg/dL — ABNORMAL HIGH (ref 1.7–2.4)

## 2019-05-25 MED ORDER — DIGOXIN 125 MCG PO TABS
0.1250 mg | ORAL_TABLET | Freq: Every day | ORAL | Status: DC
  Administered 2019-05-25 – 2019-05-29 (×5): 0.125 mg via ORAL
  Filled 2019-05-25 (×5): qty 1

## 2019-05-25 MED ORDER — PANTOPRAZOLE SODIUM 40 MG PO TBEC
80.0000 mg | DELAYED_RELEASE_TABLET | Freq: Every day | ORAL | Status: DC
  Administered 2019-05-26 – 2019-05-29 (×4): 80 mg via ORAL
  Filled 2019-05-25 (×4): qty 2

## 2019-05-25 MED ORDER — ALBUTEROL SULFATE (2.5 MG/3ML) 0.083% IN NEBU: 2.5000 mg | INHALATION_SOLUTION | RESPIRATORY_TRACT | Status: DC | PRN

## 2019-05-25 MED ORDER — SODIUM CHLORIDE 0.9% FLUSH: 3.0000 mL | INTRAVENOUS | Status: DC | PRN

## 2019-05-25 MED ORDER — SODIUM CHLORIDE 0.9 % IV SOLN: 250.0000 mL | INTRAVENOUS | Status: DC | PRN

## 2019-05-25 MED ORDER — TRAZODONE HCL 50 MG PO TABS
50.0000 mg | ORAL_TABLET | Freq: Every evening | ORAL | Status: DC | PRN
  Administered 2019-05-25 – 2019-05-28 (×3): 50 mg via ORAL
  Filled 2019-05-25 (×3): qty 1

## 2019-05-25 MED ORDER — ASPIRIN EC 81 MG PO TBEC
81.0000 mg | DELAYED_RELEASE_TABLET | Freq: Every day | ORAL | Status: DC
  Administered 2019-05-26 – 2019-05-29 (×4): 81 mg via ORAL
  Filled 2019-05-25 (×5): qty 1

## 2019-05-25 MED ORDER — SODIUM CHLORIDE 0.9% FLUSH
3.0000 mL | Freq: Two times a day (BID) | INTRAVENOUS | Status: DC
  Administered 2019-05-25 – 2019-05-26 (×2): 3 mL via INTRAVENOUS

## 2019-05-25 MED ORDER — LOSARTAN POTASSIUM 25 MG PO TABS
12.5000 mg | ORAL_TABLET | Freq: Every day | ORAL | Status: DC
  Administered 2019-05-25 – 2019-05-28 (×4): 12.5 mg via ORAL
  Filled 2019-05-25 (×4): qty 1

## 2019-05-25 MED ORDER — ONDANSETRON HCL 4 MG/2ML IJ SOLN: 4.0000 mg | Freq: Four times a day (QID) | INTRAMUSCULAR | Status: DC | PRN

## 2019-05-25 MED ORDER — NITROGLYCERIN 0.4 MG SL SUBL: 0.4000 mg | SUBLINGUAL_TABLET | SUBLINGUAL | Status: DC | PRN

## 2019-05-25 MED ORDER — LATANOPROST 0.005 % OP SOLN
1.0000 [drp] | Freq: Every day | OPHTHALMIC | Status: DC
  Administered 2019-05-25 – 2019-05-28 (×4): 1 [drp] via OPHTHALMIC
  Filled 2019-05-25: qty 2.5

## 2019-05-25 MED ORDER — SPIRONOLACTONE 12.5 MG HALF TABLET
12.5000 mg | ORAL_TABLET | Freq: Every day | ORAL | Status: DC
  Administered 2019-05-27 – 2019-05-29 (×3): 12.5 mg via ORAL
  Filled 2019-05-25 (×5): qty 1

## 2019-05-25 MED ORDER — SODIUM CHLORIDE 0.9 % IV SOLN: INTRAVENOUS | Status: DC

## 2019-05-25 MED ORDER — MORPHINE SULFATE (PF) 2 MG/ML IV SOLN
1.0000 mg | INTRAVENOUS | Status: DC | PRN
  Administered 2019-05-26: 1 mg via INTRAVENOUS
  Filled 2019-05-25: qty 1

## 2019-05-25 MED ORDER — ACETAMINOPHEN 325 MG PO TABS: 650.0000 mg | ORAL_TABLET | ORAL | Status: DC | PRN

## 2019-05-25 MED ORDER — ENOXAPARIN SODIUM 40 MG/0.4ML ~~LOC~~ SOLN
40.0000 mg | SUBCUTANEOUS | Status: DC
  Administered 2019-05-25 – 2019-05-28 (×4): 40 mg via SUBCUTANEOUS
  Filled 2019-05-25 (×4): qty 0.4

## 2019-05-25 MED ORDER — FUROSEMIDE 10 MG/ML IJ SOLN
40.0000 mg | Freq: Once | INTRAMUSCULAR | Status: AC
  Administered 2019-05-25: 40 mg via INTRAVENOUS
  Filled 2019-05-25: qty 4

## 2019-05-25 MED ORDER — ATORVASTATIN CALCIUM 40 MG PO TABS
40.0000 mg | ORAL_TABLET | Freq: Every day | ORAL | Status: DC
  Administered 2019-05-26 – 2019-05-28 (×3): 40 mg via ORAL
  Filled 2019-05-25 (×4): qty 1

## 2019-05-25 NOTE — Progress Notes (Addendum)
Received report from RN Rogers Seeds at [phone]. Patient arrive approx 1645.   Magnesium infusion complete approximately 1705.   Paged HF team at 1702 to inform patient arrived to unit. Spoke with receptionist at after hours clinic. Upon callback from [place] at [place]; made aware patient has arrived and awaiting orders.  1809 spoke with Sarajane Jews at bedside, informed HR in 110's (will consult with [facility] for orders). Patient A&O x4, calm, resting with no distress, wife present. Still awaiting orders.

## 2019-05-25 NOTE — Progress Notes (Signed)
   Plan is for right/left heart catheterization tomorrow with Dr. Haroldine Laws. The patient understands that risks include but are not limited to stroke (1 in 1000), death (1 in 46), kidney failure [usually temporary] (1 in 500), bleeding (1 in 200), allergic reaction [possibly serious] (1 in 200), and agrees to proceed. Pre-cath orders have been placed. Patient NPO at midnight.  Darreld Mclean, PA-C 05/25/2019 11:24 PM

## 2019-05-25 NOTE — H&P (Addendum)
Cardiology Admission History and Physical:   Patient ID: FARZAD AVITABILE MRN: [id number]; DOB: 11-Jan-1951   Admission date: 05/25/2019  Primary Care Provider: Patient, No Pcp Per Primary Cardiologist: New to [facility] Primary Electrophysiologist:  None   Chief Complaint:  Chest Pain, Abdominal Pain, Shortness of Breath  Patient Profile:   DONIVEN KAMM is a 69 y.o. male with a history of hypertension, hyperlipidemia, and diabetes mellitus but no known cardiac history who presented to [facility] on 05/21/2019 with multiple complaints of chest pain, abdominal pain, and shortness of breath and was discovered to acute systolic CHF with EF of [id number]. He was transferred to [facility] for further management/evaluation of this.   History of Present Illness:   Mr. Wigger is a 69 year old male with the above history.  No known cardiac history.  He presented to the [facility] ED on 05/20/2018 2021 after gradual progression of weakness, shortness of breath, upper abdominal/chest pain for the past month.  Patient's pain complaints are weakness shortness of breath with activity and with bending over and upper abdominal pain that is also worse when bending over.  He describes the abdominal pain as a "pressure."  He also notes some chest discomfort but states he has trouble differentiating this from the abdominal pain.  He does note some chest discomfort and shortness of breath though with activity over the prior month.  He also notes some nausea, abdominal fascia distention, lower extremity edema.  Some lightheadedness at times but no palpitations or syncope.  He notes an occasional nonproductive cough she attributes to his asthma but denies any fever or recent illnesses.  No abnormal bleeding.  In the [facility] ED, patient tachycardic in the 120's but vitals stable. EKG showed sinus tachycardia. Troponin negative. ProB- BNP elevated at 4770. Chest x-ray showed cardiomegaly with central pulmonary vascular  congestion suggestive of mild CHF but no over alveolar pulmonary edema. D-dimer Quant elevated at 1,289. Chest CTA showed a small right pleural effusion but no PE. CT of abdomen/pelvis showed scattered hypodense lesion in the liver and left kidney (technically non-specific although statistically likely to be cysts) but no specific cause for patient's upper abdominal pain.  WBC 4.7, Hgb 11.2, Plts 249. Na 132, K 3.8, Glucose 150, BUN 15, Cr 1.0. Respiratory panel negative for COVID-19 and influenza A/B. Patient was admitted for acute CHF and started on IV Lasix. Echo showed LVEF of 25-30% with severe global hypokinesis. RV also noted to be moderately enlarged with moderately impaired systolic function. Moderate MR and TR also noted.   Patient evaluated by Cardiology (Dr. Bettina Gavia) while at [facility]. Physical exam felt to be consistent with decompensated heart failure with hepatic congestion. Patient was switched from an Lisinopril to Losartan in anticipation for need of Entresto and was started on Spironolactone. Per note, patient was transitioned to oral Torsemide on 05/23/2019. However, patient then developed hypotension with systolic BP in the [id number] requiring 250cc normal saline bolus. Upper abdominal ultrasound on 05/23/2019 showed multiple appendix cysts but was negative for gallstones or biliary dilatation. Patient was doing to be discharged on 05/24/2019 with plan for outpatient left/heart catheterization but started to feel very weak and lightheaded. Orthopstatic vital signs were unremarkable but he was in sinus tachycardia with rates in the low 100's with occasional PVCs. Therefore, he was kept an additional night. Telemetry showed short runs of polymorphous PVCs. Therefore, decision was made to transfer patient to [facility] for further evaluation/management including right/left heart cath.   At the time of  this evaluation, patient sitting comfortably in chair.  He continues to have some upper abdominal  pressure.  No chest pain.  Breathing comfortably at rest.  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  No recreational drug use.  He does have a family history of heart disease on his father's side.  He is father had a cardiac arrest in his 42s and survived to this and ultimately had a pacemaker placed.  His father ended up dying from a stroke in his 69s.  His paternal grandfather died of a heart attack in his 48s.  In his paternal uncle, has a history a CABG.   Heart Pathway Score:     Past Medical History:  Diagnosis Date  . Diabetes mellitus without complication ([place])   . Hypertension     No past surgical history on file.   Medications Prior to Admission: Prior to Admission medications   Medication Sig Start Date End Date Taking? Authorizing Provider  atorvastatin (LIPITOR) 40 MG tablet Take 40 mg by mouth daily at 6 PM.  02/16/19   [provider]  carvedilol (COREG) 6.25 MG tablet Take 1 tablet (6.25 mg total) by mouth 2 (two) times daily. 03/25/19 03/24/20  Georgette Shell, MD  latanoprost (XALATAN) 0.005 % ophthalmic solution Place 1 drop into both eyes at bedtime. 02/16/19   [provider]  lisinopril (ZESTRIL) 20 MG tablet Take 1 tablet (20 mg total) by mouth daily. 03/26/19   Georgette Shell, MD  metFORMIN (GLUCOPHAGE) 500 MG tablet Take 500 mg by mouth daily. 06/14/18   [provider]  omeprazole (PRILOSEC) 40 MG capsule Take 40 mg by mouth daily. 01/13/19   [provider]  traMADol (ULTRAM) 50 MG tablet Take 100 mg by mouth every 6 (six) hours. 03/20/19   [provider]     Allergies:    Allergies  Allergen Reactions  . Codeine Nausea Only    "makes me feel weird"  . Penicillins Other (See Comments)    States a long time ago, does not recall reaction     Social History:   Social History   Socioeconomic History  . Marital status: Married    Spouse name: Not  on file  . Number of children: Not on file  . Years of education: Not on file  . Highest education level: Not on file  Occupational History  . Not on file  Tobacco Use  . Smoking status: Current Some Day Smoker    Types: Cigarettes  . Smokeless tobacco: Never Used  Substance and Sexual Activity  . Alcohol use: Not on file  . Drug use: Not on file  . Sexual activity: Not on file  Other Topics Concern  . Not on file  Social History Narrative  . Not on file   Social Determinants of Health   Financial Resource Strain:   . Difficulty of Paying Living Expenses: Not on file  Food Insecurity:   . Worried About Charity fundraiser in the Last Year: Not on file  . Ran Out of Food in the Last Year: Not on file  Transportation Needs:   . Lack of Transportation (Medical): Not on file  . Lack of Transportation (Non-Medical): Not on file  Physical Activity:   . Days of Exercise per Week: Not on file  . Minutes of Exercise per Session: Not on file  Stress:   . Feeling  of Stress : Not on file  Social Connections:   . Frequency of Communication with Friends and Family: Not on file  . Frequency of Social Gatherings with Friends and Family: Not on file  . Attends Religious Services: Not on file  . Active Member of Clubs or Organizations: Not on file  . Attends Archivist Meetings: Not on file  . Marital Status: Not on file  Intimate Partner Violence:   . Fear of Current or Ex-Partner: Not on file  . Emotionally Abused: Not on file  . Physically Abused: Not on file  . Sexually Abused: Not on file    Family History:   The patient's family history includes CAD in his paternal uncle; Heart attack in his paternal grandfather; Heart disease in his father.    ROS:  Please see the history of present illness.  All other ROS reviewed and negative.     Physical Exam/Data:   Vitals:   05/25/19 1653  BP: 120/89  Pulse: (!) 113  Resp: 20  Temp: 98.2 F (36.8 C)  TempSrc: Oral    SpO2: 100%  Weight: 77.3 kg  Height: 5\' 10"  (1.778 m)   No intake or output data in the 24 hours ending 05/25/19 2006 Last 3 Weights 05/25/2019 03/24/2019 03/23/2019  Weight (lbs) 170 lb 8 oz 172 lb 13.5 oz 172 lb 6.4 oz  Weight (kg) 77.338 kg 78.4 kg 78.2 kg     Body mass index is 24.46 kg/m.  General: 69 y.o. male resting comfortably in no acute distress. HEENT: Normocephalic and atraumatic.  Neck: Supple. No carotid bruits. Possible JVD with patient sitting upright. Heart: Tachycardic with regular rhythm. Distinct S1 and S2. Possible soft systolic murmur at lower sternal border. Possible gallop. Radial and distal pedal pulses 2+ and equal bilaterally. Lungs: No increased work of breathing. Clear to ausculation bilaterally. No wheezes, rhonchi, or rales.  Abdomen: Soft, mildly distended, and non-tender to palpation. Bowel sounds present. Extremities: Trace lower extremity edema.    Skin: Warm and dry. Neuro: Alert and oriented x3. No focal deficits. Psych: Normal affect. Responds appropriately.  EKG:  The ECG that was done was personally reviewed and demonstrates sinus tachycardia with rate in the low 100's to 110's with RBBB. EKG from 05/24/2019 at [facility] showed new T wave inversion in precordial leads compared to previous tracings.   Relevant CV Studies:  Echocardiogram 05/22/2019 (at [facility]): -LVEF of 25 to 30% with severe global hypokinesis of the LV.  RV moderately dilated. -RV moderately enlarged and RV systolic function moderately impaired. -Left atrium severely dilated by volume. -Moderate mitral regurgitation and moderate tricuspid regurgitation noted.  Laboratory Data:  High Sensitivity Troponin:  No results for input(s): TROPONINIHS in the last 720 hours.    ChemistryNo results for input(s): NA, K, CL, CO2, GLUCOSE, BUN, CREATININE, CALCIUM, GFRNONAA, GFRAA, ANIONGAP in the last 168 hours.  No results for input(s): PROT, ALBUMIN, AST, ALT, ALKPHOS, BILITOT in the  last 168 hours. HematologyNo results for input(s): WBC, RBC, HGB, HCT, MCV, MCH, MCHC, RDW, PLT in the last 168 hours. BNPNo results for input(s): BNP, PROBNP in the last 168 hours.  DDimer No results for input(s): DDIMER in the last 168 hours.   Radiology/Studies:  No results found. {  Assessment and Plan:   Newly Diagnosed Acute Systolic CHF - Patient presented with shortness of breath, abdominal/chest pain, and apnea and orthopnea. -proBNP elevated at 4000 at [facility].  Will recheck BMP here. -Echo at [facility] showed LVEF  of 25 to 30% with severe global hypokinesis.  RV also noted to be moderately enlarged with moderately impaired RV systolic function. -Patient was initially diuresed with IV Lasix at [facility] and then transitioned to PO Torsemide but developed hypotension with this requiring IV fluids. -Mild abdominal distention on exam with trace lower extremity edema but does not appear markedly volume overloaded. -Will give IV Lasix 40mg  once tonight. -Will hold beta-blocker for now until right/left heart cath tomorrow.  -We start Losartan 12.5mg  daily and digoxin 0.125 mg daily. -Continue Spirinolactone 12.5mg  daily.  -Continue to monitor daily weights, strict I's and O's, and renal function.  Hypertension - BP currently well controlled.  - Continue Spironolactone and Losartan as above.  Hyperlipidemia - Lipid panel from [place]: Total Cholesterol 120, Triglycerides 75, HDL 61, LDL 44.  - Continue Lipitor 40mg  daily.  Type 2 Diabetes Mellitus - Will check hemoglobin A1c.  - Will hold home metformin and start sliding scale insulin.   Abdominal Pain - Abdominal/Pelvic CT and abdominal ultrasound at [facility] unremarkable. - Suspect hepatic congestion secondary to CHF. - Will check CMET here.  Asthma - Continue home albuterol and fluticasone inhalers as needed.   Of note, will give Trazodone 50mg  to help him sleep per MD.   Severity of Illness: The appropriate  patient status for this patient is INPATIENT. Inpatient status is judged to be reasonable and necessary in order to provide the required intensity of service to ensure the patient's safety. The patient's presenting symptoms, physical exam findings, and initial radiographic and laboratory data in the context of their chronic comorbidities is felt to place them at high risk for further clinical deterioration. Furthermore, it is not anticipated that the patient will be medically stable for discharge from the hospital within 2 midnights of admission. The following factors support the patient status of inpatient.   " The patient's presenting symptoms include shortness of breath, abdominal pain, and chest pain. " The worrisome physical exam findings include tachycardia and volume overload. " The initial radiographic and laboratory data are worrisome because of newly reduced EF suggestive of CHF. " The chronic co-morbidities include HTN, HLD, DM.   * I certify that at the point of admission it is my clinical judgment that the patient will require inpatient hospital care spanning beyond 2 midnights from the point of admission due to high intensity of service, high risk for further deterioration and high frequency of surveillance required.*    For questions or updates, please contact [facility] Please consult [email or web address] for contact info under        Signed, Eppie Gibson  05/25/2019 8:06 PM   Patient seen and examined with the above-signed Advanced Practice Provider and/or Housestaff. I personally reviewed laboratory data, imaging studies and relevant notes. I independently examined the patient and formulated the important aspects of the plan. I have edited the note to reflect any of my changes or salient points. I have personally discussed the plan with the patient and/or family.  69 y/o male with HTN, HL, DM2, former tobacco and ETOH use (quit 30 years ago).   Admitted in 12/20 with  severe hyponatremia.   Since that time progressive weakness, exertional dyspnea, LE edema and bendopnea. Denies CP but has had ab pain with exertion.   Admitted to [facility] and Echo with EF 25%. Developed NSVT with polymorphic ectopy.   ECG with RBBB and new lateral TWI.   On exam General:  Weak appearing. No resp difficulty HEENT: normal  Neck: supple. JBP 9-10. Carotids 2+ bilat; no bruits. No lymphadenopathy or thryomegaly appreciated. Cor: PMI nondisplaced. Irregular + s3 Lungs: clear Abdomen: soft, nontender, nondistended. Liver edge down. No bruits or masses. Good bowel sounds. Extremities: no cyanosis, clubbing, rash, edema Neuro: alert & orientedx3, cranial nerves grossly intact. moves all 4 extremities w/o difficulty. Affect pleasant  Very concerning presentation. He has recently discovered severe LV dysfunction with NYHA IV symptoms and complex ventricular ectopy. High concern for underlying ischemic heart disease and low output. Plan R/L heart cath in am. If cath without significant CAD will need cMRI and need to consider his ectopy as possible cause of his CM.   Glori Bickers, MD  8:14 PM

## 2019-05-25 NOTE — Plan of Care (Signed)

## 2019-05-26 ENCOUNTER — Inpatient Hospital Stay (HOSPITAL_COMMUNITY): Payer: Medicare HMO

## 2019-05-26 ENCOUNTER — Ambulatory Visit (HOSPITAL_COMMUNITY): Admission: RE | Admit: 2019-05-26 | Payer: Medicare HMO | Source: Home / Self Care | Admitting: Internal Medicine

## 2019-05-26 ENCOUNTER — Encounter (HOSPITAL_COMMUNITY)
Admission: AD | Disposition: A | Discharge status: Home / Self Care | Payer: Self-pay | Source: Other Acute Inpatient Hospital | Attending: Internal Medicine

## 2019-05-26 DIAGNOSIS — I5021 Acute systolic (congestive) heart failure: Secondary | ICD-10-CM

## 2019-05-26 DIAGNOSIS — E119 Type 2 diabetes mellitus without complications: Secondary | ICD-10-CM | POA: Insufficient documentation

## 2019-05-26 DIAGNOSIS — J449 Chronic obstructive pulmonary disease, unspecified: Secondary | ICD-10-CM | POA: Insufficient documentation

## 2019-05-26 DIAGNOSIS — F419 Anxiety disorder, unspecified: Secondary | ICD-10-CM | POA: Insufficient documentation

## 2019-05-26 DIAGNOSIS — R1010 Upper abdominal pain, unspecified: Secondary | ICD-10-CM | POA: Insufficient documentation

## 2019-05-26 DIAGNOSIS — I110 Hypertensive heart disease with heart failure: Secondary | ICD-10-CM | POA: Insufficient documentation

## 2019-05-26 DIAGNOSIS — I10 Essential (primary) hypertension: Secondary | ICD-10-CM | POA: Insufficient documentation

## 2019-05-26 DIAGNOSIS — E7800 Pure hypercholesterolemia, unspecified: Secondary | ICD-10-CM | POA: Insufficient documentation

## 2019-05-26 DIAGNOSIS — I5020 Unspecified systolic (congestive) heart failure: Secondary | ICD-10-CM | POA: Insufficient documentation

## 2019-05-26 DIAGNOSIS — R0602 Shortness of breath: Secondary | ICD-10-CM | POA: Insufficient documentation

## 2019-05-26 DIAGNOSIS — E785 Hyperlipidemia, unspecified: Secondary | ICD-10-CM

## 2019-05-26 DIAGNOSIS — IMO0001 Reserved for inherently not codable concepts without codable children: Secondary | ICD-10-CM | POA: Insufficient documentation

## 2019-05-26 DIAGNOSIS — I509 Heart failure, unspecified: Secondary | ICD-10-CM | POA: Insufficient documentation

## 2019-05-26 DIAGNOSIS — M62838 Other muscle spasm: Secondary | ICD-10-CM | POA: Insufficient documentation

## 2019-05-26 DIAGNOSIS — J90 Pleural effusion, not elsewhere classified: Secondary | ICD-10-CM | POA: Insufficient documentation

## 2019-05-26 HISTORY — PX: RIGHT/LEFT HEART CATH AND CORONARY ANGIOGRAPHY: CATH118266

## 2019-05-26 LAB — GLUCOSE, CAPILLARY
Glucose-Capillary: 166 mg/dL — ABNORMAL HIGH (ref 70–99)
Glucose-Capillary: 136 mg/dL — ABNORMAL HIGH (ref 70–99)
Glucose-Capillary: 94 mg/dL (ref 70–99)
Glucose-Capillary: 148 mg/dL — ABNORMAL HIGH (ref 70–99)

## 2019-05-26 LAB — BASIC METABOLIC PANEL
Anion gap: 10 (ref 5–15)
BUN: 20 mg/dL (ref 8–23)
CO2: 22 mmol/L (ref 22–32)
Calcium: 9.1 mg/dL (ref 8.9–10.3)
Chloride: 102 mmol/L (ref 98–111)
Creatinine, Ser: 1.28 mg/dL — ABNORMAL HIGH (ref 0.61–1.24)
GFR calc Af Amer: >60 mL/min (ref >60)
GFR calc non Af Amer: 57 mL/min — ABNORMAL LOW (ref >60)
Glucose, Bld: 137 mg/dL — ABNORMAL HIGH (ref 70–99)
Potassium: 4 mmol/L (ref 3.5–5.1)
Sodium: 134 mmol/L — ABNORMAL LOW (ref 135–145)

## 2019-05-26 LAB — HEMOGLOBIN A1C
Hgb A1c MFr Bld: 7.9 % — ABNORMAL HIGH (ref 4.8–5.6)
Mean Plasma Glucose: 180.03 mg/dL

## 2019-05-26 LAB — ECHOCARDIOGRAM COMPLETE
Height: 70 in
Weight: 2686.4 oz

## 2019-05-26 LAB — SARS CORONAVIRUS 2 (TAT 6-24 HRS): SARS Coronavirus 2: NEGATIVE

## 2019-05-26 IMAGING — MR MR CARD MORPHOLOGY WO/W CM
46 of 48 series · 46 of 48 positions shown · IV contrast (Gadavist)
Comparison: none

CLINICAL DATA: Cardiomyopathy of uncertain etiology

EXAM:
CARDIAC MRI
TECHNIQUE: The patient was scanned on a 1.5 Tesla GE magnet. A dedicated
cardiac coil was used. Functional imaging was done using Fiesta
sequences. [DATE], and 4 chamber views were done to assess for RWMA's.
Modified J MICHAEL rule using a short axis stack was used to
calculate an ejection fraction on a dedicated work station using
Circle software. The patient received 8 cc of Gadavist. After 10
minutes inversion recovery sequences were used to assess for
infiltration and scar tissue.
CONTRAST:  Gadavist 8 cc

[Series 6: bSSFP · sagittal · 8.0mm · 1.43mm/px · 1 of 25 slices shown (1 of 20)]
[im 1/25]
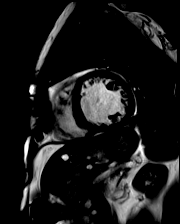

[Series 6: bSSFP · sagittal · 8.0mm · 1.43mm/px · 1 of 25 slices shown (2 of 20)]
[im 1/25]
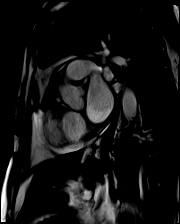

[Series 6: bSSFP · sagittal · 8.0mm · 1.43mm/px · 1 of 25 slices shown (3 of 20)]
[im 1/25]
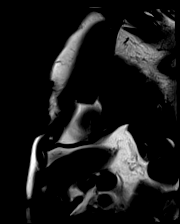

[Series 6: bSSFP · sagittal · 8.0mm · 1.43mm/px · 1 of 25 slices shown (4 of 20)]
[im 1/25]
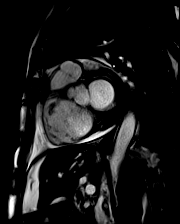

[Series 6: bSSFP · sagittal · 8.0mm · 1.43mm/px · 1 of 25 slices shown (5 of 20)]
[im 1/25]
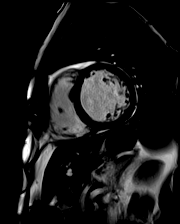

[Series 6: bSSFP · sagittal · 8.0mm · 1.43mm/px · 1 of 25 slices shown (6 of 20)]
[im 1/25]
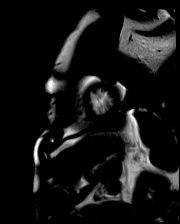

[Series 6: bSSFP · sagittal · 8.0mm · 1.43mm/px · 1 of 25 slices shown (7 of 20)]
[im 1/25]
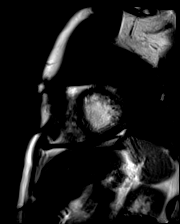

[Series 6: bSSFP · sagittal · 8.0mm · 1.43mm/px · 1 of 25 slices shown (8 of 20)]
[im 1/25]
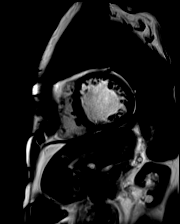

[Series 6: bSSFP · sagittal · 8.0mm · 1.43mm/px · 1 of 25 slices shown (9 of 20)]
[im 1/25]
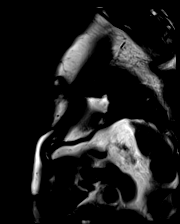

[Series 6: bSSFP · sagittal · 8.0mm · 1.43mm/px · 1 of 25 slices shown (10 of 20)]
[im 1/25]
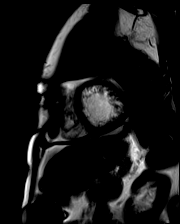

[Series 6: bSSFP · sagittal · 8.0mm · 1.43mm/px · 1 of 25 slices shown (11 of 20)]
[im 1/25]
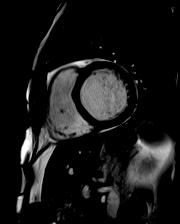

[Series 6: bSSFP · sagittal · 8.0mm · 1.43mm/px · 1 of 25 slices shown (12 of 20)]
[im 1/25]
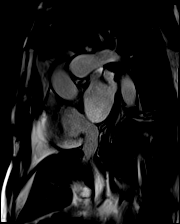

[Series 6: bSSFP · sagittal · 8.0mm · 1.43mm/px · 1 of 25 slices shown (13 of 20)]
[im 1/25]
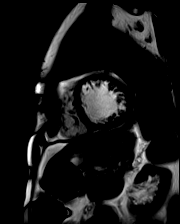

[Series 6: bSSFP · sagittal · 8.0mm · 1.43mm/px · 1 of 25 slices shown (14 of 20)]
[im 1/25]
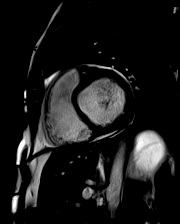

[Series 6: bSSFP · sagittal · 8.0mm · 1.43mm/px · 1 of 25 slices shown (15 of 20)]
[im 1/25]
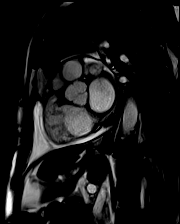

[Series 6: bSSFP · sagittal · 8.0mm · 1.43mm/px · 1 of 25 slices shown (16 of 20)]
[im 1/25]
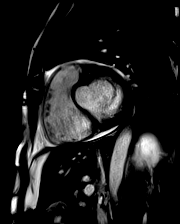

[Series 6: bSSFP · sagittal · 8.0mm · 1.43mm/px · 1 of 25 slices shown (17 of 20)]
[im 1/25]
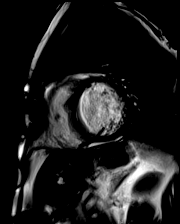

[Series 8: (id)_long_t1_moco · sagittal · 8.0mm · 1.33mm/px · 1 of 24 slices shown]
[im 1/24]
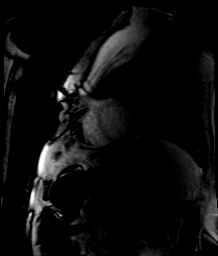

[Series 9: (id)_long_t1_moco_t1 · sagittal · 8.0mm · 1.33mm/px · 1 of 6 slices shown]
[im 1/6]
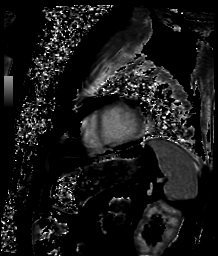

[Series 11: cine_trufi_cs_rt_short axis · sagittal · 8.0mm · 1.54mm/px · 1 of 10 slices shown (1 of 17)]
[im 1/10]
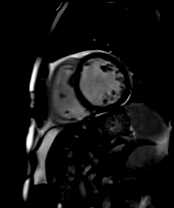

[Series 11: cine_trufi_cs_rt_short axis · sagittal · 8.0mm · 1.54mm/px · 1 of 10 slices shown (2 of 17)]
[im 1/10]
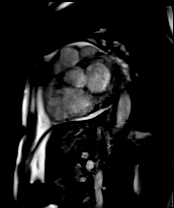

[Series 11: cine_trufi_cs_rt_short axis · sagittal · 8.0mm · 1.54mm/px · 1 of 10 slices shown (3 of 17)]
[im 1/10]
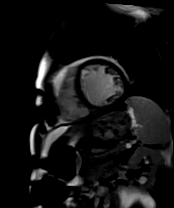

[Series 11: cine_trufi_cs_rt_short axis · sagittal · 8.0mm · 1.54mm/px · 1 of 10 slices shown (4 of 17)]
[im 1/10]
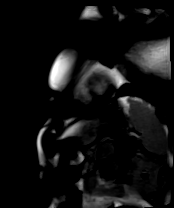

[Series 11: cine_trufi_cs_rt_short axis · sagittal · 8.0mm · 1.54mm/px · 1 of 10 slices shown (5 of 17)]
[im 1/10]
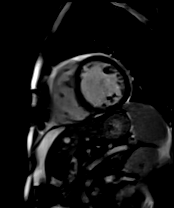

[Series 11: cine_trufi_cs_rt_short axis · sagittal · 8.0mm · 1.54mm/px · 1 of 10 slices shown (6 of 17)]
[im 1/10]
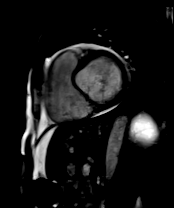

[Series 11: cine_trufi_cs_rt_short axis · sagittal · 8.0mm · 1.54mm/px · 1 of 10 slices shown (7 of 17)]
[im 1/10]
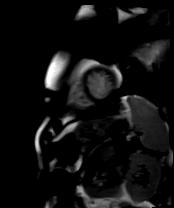

[Series 11: cine_trufi_cs_rt_short axis · sagittal · 8.0mm · 1.54mm/px · 1 of 10 slices shown (8 of 17)]
[im 1/10]
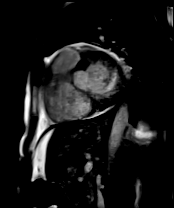

[Series 11: cine_trufi_cs_rt_short axis · sagittal · 8.0mm · 1.54mm/px · 1 of 10 slices shown (9 of 17)]
[im 1/10]
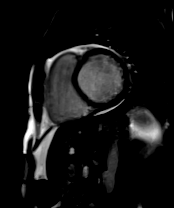

[Series 11: cine_trufi_cs_rt_short axis · sagittal · 8.0mm · 1.54mm/px · 1 of 10 slices shown (10 of 17)]
[im 1/10]
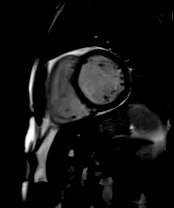

[Series 11: cine_trufi_cs_rt_short axis · sagittal · 8.0mm · 1.54mm/px · 1 of 10 slices shown (11 of 17)]
[im 1/10]
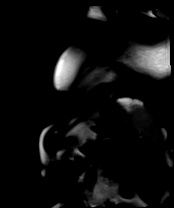

[Series 11: cine_trufi_cs_rt_short axis · sagittal · 8.0mm · 1.54mm/px · 1 of 10 slices shown (12 of 17)]
[im 1/10]
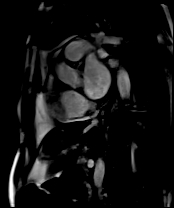

[Series 11: cine_trufi_cs_rt_short axis · sagittal · 8.0mm · 1.54mm/px · 1 of 10 slices shown (13 of 17)]
[im 1/10]
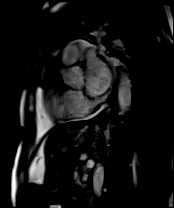

[Series 11: cine_trufi_cs_rt_short axis · sagittal · 8.0mm · 1.54mm/px · 1 of 10 slices shown (14 of 17)]
[im 1/10]
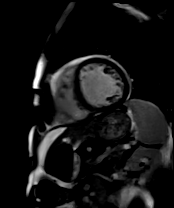

[Series 11: cine_trufi_cs_rt_short axis · sagittal · 8.0mm · 1.54mm/px · 1 of 10 slices shown (15 of 17)]
[im 1/10]
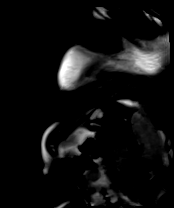

[Series 11: cine_trufi_cs_rt_short axis · sagittal · 8.0mm · 1.54mm/px · 1 of 10 slices shown (16 of 17)]
[im 1/10]
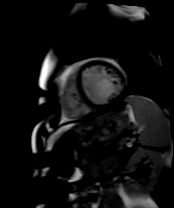

[Series 11: cine_trufi_cs_rt_short axis · sagittal · 8.0mm · 1.54mm/px · 1 of 10 slices shown (17 of 17)]
[im 1/10]
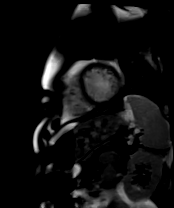

[Series 13: (id)_trufi_moco · sagittal · 8.0mm · 1.77mm/px · 1 of 9 slices shown]
[im 1/9]
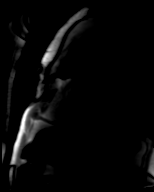

[Series 17: bSSFP · axial · 6.0mm · 1.41mm/px · 1 of 25 slices shown (18 of 20)]
[im 1/25]
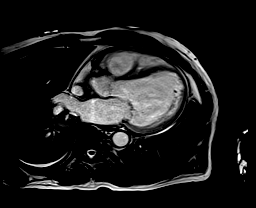

[Series 18: bSSFP · oblique · 6.0mm · 1.41mm/px · 1 of 25 slices shown (19 of 20)]
[im 1/25]
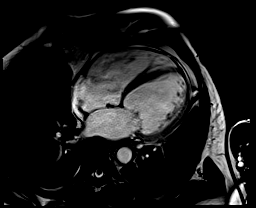

[Series 19: bSSFP · coronal · 6.0mm · 1.41mm/px · 1 of 25 slices shown (20 of 20)]
[im 1/25]
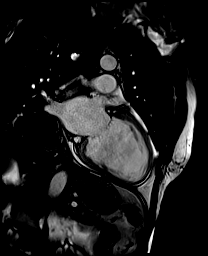

[Series 21: lge_single shot sa · sagittal · 8.0mm · 1.67mm/px · 1 of 17 slices shown (1 of 2)]
[im 1/17]
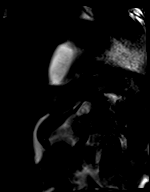

[Series 22: lge_single shot sa · sagittal · 8.0mm · 1.67mm/px · 1 of 17 slices shown (2 of 2)]
[im 1/17]
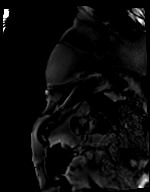

[Series 23: lge_single shot radial_mag · axial · 6.0mm · 1.98mm/px · 1 of 1 slices shown]
[im 1/1]
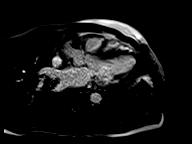

[Series 24: lge_single shot radial_psir · axial · 6.0mm · 1.98mm/px · 1 of 1 slices shown]
[im 1/1]
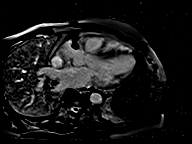

[Series 30: (id)_short_t1_moco · sagittal · 8.0mm · 1.33mm/px · 1 of 27 slices shown]
[im 1/27]
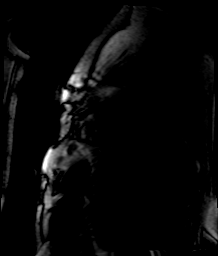

[Series 31: (id)_short_t1_moco_t1 · sagittal · 8.0mm · 1.33mm/px · 1 of 6 slices shown]
[im 1/6]
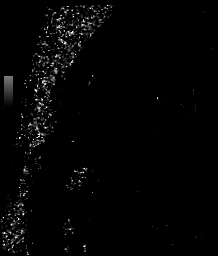

[46 of 48 positions shown; findings below may reference images not displayed]

FINDINGS: Limited images of the lung windows showed no gross abnormalities.

Small circumferential pericardial effusion. Severely dilated left
ventricle with normal wall thickness. Diffuse severe hypokinesis
with EF 18%. No LV thrombus noted. The RV was moderately dilated
with moderately decreased systolic function, EF 28%. Moderate left
atrial enlargement. Mild right atrial enlargement. Mild mitral
regurgitation. Trivial tricuspid regurgitation. Trileaflet aortic
valve with no significant regurgitation or stenosis.

On delayed enhancement imaging, there was mid-wall late gadolinium
enhancement at the basal inferoseptal RV insertion site.

Measurements:

LVEDV 285 mL

LVSV 52 mL
LVEF 18%

RVEDV 288 mL
RVSV 79 mL

RVEF 28%

ECV 39%
IMPRESSION: 1.  Severely dilated LV with EF 18%, diffuse hypokinesis.

2.  Moderately dilated RV with EF 28%.

3. Nonspecific inferoseptal RV insertion site LGE, can be suggestive
of pressure overload/CHF.

4. ECV percentage elevated at 39%, can see with increased fibrosis
with cardiomyopathy or myocarditis. Generally amyloidosis tends to
be >40%.

J MICHAEL

## 2019-05-26 SURGERY — RIGHT/LEFT HEART CATH AND CORONARY ANGIOGRAPHY
Anesthesia: LOCAL

## 2019-05-26 MED ORDER — IOHEXOL 350 MG/ML SOLN
INTRAVENOUS | Status: AC
  Filled 2019-05-26: qty 1

## 2019-05-26 MED ORDER — MIDAZOLAM HCL 2 MG/2ML IJ SOLN
INTRAMUSCULAR | Status: AC
  Filled 2019-05-26: qty 2

## 2019-05-26 MED ORDER — VERAPAMIL HCL 2.5 MG/ML IV SOLN
INTRAVENOUS | Status: DC | PRN
  Administered 2019-05-26: 10 mL via INTRA_ARTERIAL

## 2019-05-26 MED ORDER — HEPARIN (PORCINE) IN NACL 1000-0.9 UT/500ML-% IV SOLN
INTRAVENOUS | Status: AC
  Filled 2019-05-26: qty 1000

## 2019-05-26 MED ORDER — FENTANYL CITRATE (PF) 100 MCG/2ML IJ SOLN
INTRAMUSCULAR | Status: DC | PRN
  Administered 2019-05-26: 25 ug via INTRAVENOUS

## 2019-05-26 MED ORDER — HEPARIN (PORCINE) IN NACL 1000-0.9 UT/500ML-% IV SOLN
INTRAVENOUS | Status: DC | PRN
  Administered 2019-05-26: 500 mL

## 2019-05-26 MED ORDER — IOHEXOL 350 MG/ML SOLN
INTRAVENOUS | Status: DC | PRN
  Administered 2019-05-26: 35 mL

## 2019-05-26 MED ORDER — ACETAMINOPHEN 325 MG PO TABS
650.0000 mg | ORAL_TABLET | ORAL | Status: DC | PRN
  Administered 2019-05-27 – 2019-05-29 (×3): 650 mg via ORAL
  Filled 2019-05-26 (×3): qty 2

## 2019-05-26 MED ORDER — ONDANSETRON HCL 4 MG/2ML IJ SOLN: 4.0000 mg | Freq: Four times a day (QID) | INTRAMUSCULAR | Status: DC | PRN

## 2019-05-26 MED ORDER — SODIUM CHLORIDE 0.9 % IV SOLN: 250.0000 mL | INTRAVENOUS | Status: DC | PRN

## 2019-05-26 MED ORDER — HEPARIN SODIUM (PORCINE) 1000 UNIT/ML IJ SOLN
INTRAMUSCULAR | Status: AC
  Filled 2019-05-26: qty 1

## 2019-05-26 MED ORDER — HEPARIN SODIUM (PORCINE) 1000 UNIT/ML IJ SOLN
INTRAMUSCULAR | Status: DC | PRN
  Administered 2019-05-26: 3500 [IU] via INTRAVENOUS

## 2019-05-26 MED ORDER — HYDRALAZINE HCL 20 MG/ML IJ SOLN: 10.0000 mg | INTRAMUSCULAR | Status: AC | PRN

## 2019-05-26 MED ORDER — FENTANYL CITRATE (PF) 100 MCG/2ML IJ SOLN
INTRAMUSCULAR | Status: AC
  Filled 2019-05-26: qty 2

## 2019-05-26 MED ORDER — SODIUM CHLORIDE 0.9% FLUSH
3.0000 mL | Freq: Two times a day (BID) | INTRAVENOUS | Status: DC
  Administered 2019-05-26 – 2019-05-28 (×3): 3 mL via INTRAVENOUS

## 2019-05-26 MED ORDER — LABETALOL HCL 5 MG/ML IV SOLN: 10.0000 mg | INTRAVENOUS | Status: AC | PRN

## 2019-05-26 MED ORDER — MIDAZOLAM HCL 2 MG/2ML IJ SOLN
INTRAMUSCULAR | Status: DC | PRN
  Administered 2019-05-26: 1 mg via INTRAVENOUS

## 2019-05-26 MED ORDER — TRAMADOL HCL 50 MG PO TABS
100.0000 mg | ORAL_TABLET | Freq: Three times a day (TID) | ORAL | Status: DC | PRN
  Administered 2019-05-26 – 2019-05-29 (×8): 100 mg via ORAL
  Filled 2019-05-26 (×8): qty 2

## 2019-05-26 MED ORDER — LIDOCAINE HCL (PF) 1 % IJ SOLN
INTRAMUSCULAR | Status: DC | PRN
  Administered 2019-05-26 (×2): 2 mL

## 2019-05-26 MED ORDER — ENOXAPARIN SODIUM 40 MG/0.4ML ~~LOC~~ SOLN: 40.0000 mg | SUBCUTANEOUS | Status: DC

## 2019-05-26 MED ORDER — SODIUM CHLORIDE 0.9% FLUSH: 3.0000 mL | INTRAVENOUS | Status: DC | PRN

## 2019-05-26 MED ORDER — VERAPAMIL HCL 2.5 MG/ML IV SOLN
INTRAVENOUS | Status: AC
  Filled 2019-05-26: qty 2

## 2019-05-26 MED ORDER — LIDOCAINE HCL (PF) 1 % IJ SOLN
INTRAMUSCULAR | Status: AC
  Filled 2019-05-26: qty 30

## 2019-05-26 MED ORDER — GADOBUTROL 1 MMOL/ML IV SOLN
8.0000 mL | Freq: Once | INTRAVENOUS | Status: AC | PRN
  Administered 2019-05-26: 8 mL via INTRAVENOUS

## 2019-05-26 SURGICAL SUPPLY — 9 items
CATH 5FR JL3.5 JR4 ANG PIG MP (CATHETERS) ×1 IMPLANT
CATH BALLN WEDGE 5F 110CM (CATHETERS) ×1 IMPLANT
DEVICE RAD COMP TR BAND LRG (VASCULAR PRODUCTS) ×1 IMPLANT
GLIDESHEATH SLEND SS 6F .021 (SHEATH) ×1 IMPLANT
GUIDEWIRE INQWIRE 1.5J.035X260 (WIRE) IMPLANT
INQWIRE 1.5J .035X260CM (WIRE) ×2
PACK CARDIAC CATHETERIZATION (CUSTOM PROCEDURE TRAY) ×2 IMPLANT
SHEATH GLIDE SLENDER 4/5FR (SHEATH) ×1 IMPLANT
TRANSDUCER W/STOPCOCK (MISCELLANEOUS) ×2 IMPLANT

## 2019-05-26 NOTE — Interval H&P Note (Signed)
History and Physical Interval Note:  05/26/2019 10:04 AM  Roy Duke  has presented today for surgery, with the diagnosis of heart failure - vt.  The various methods of treatment have been discussed with the patient and family. After consideration of risks, benefits and other options for treatment, the patient has consented to  Procedure(s): RIGHT/LEFT HEART CATH AND CORONARY ANGIOGRAPHY (N/A) and possible coronary angioplasty as a surgical intervention.  The patient's history has been reviewed, patient examined, no change in status, stable for surgery.  I have reviewed the patient's chart and labs.  Questions were answered to the patient's satisfaction.     Fleda Pagel

## 2019-05-26 NOTE — Progress Notes (Signed)
  Echocardiogram 2D Echocardiogram has been performed.  Roy Duke 05/26/2019, 12:41 PM

## 2019-05-26 NOTE — Progress Notes (Addendum)
Advanced Heart Failure Rounding Note  PCP-Cardiologist: No primary care provider on file.   Subjective:   Yesterday started on losartan and digoxin. Also diuresed with IV lasix.   Complaining of joint pain. Requesting ultram.   Objective:   Weight Range: 76.2 kg Body mass index is 24.09 kg/m.   Vital Signs:   Temp:  [98 F (36.7 C)-98.6 F (37 C)] 98 F (36.7 C) (02/18 0730) Pulse Rate:  [99-113] 109 (02/18 0730) Resp:  [16-20] 18 (02/18 0730) BP: (103-129)/(69-96) 129/96 (02/18 0730) SpO2:  [97 %-100 %] 97 % (02/18 0730) Weight:  [76.2 kg-77.3 kg] 76.2 kg (02/18 0409) Last BM Date: 05/25/19  Weight change: Filed Weights   05/25/19 1653 05/26/19 0409  Weight: 77.3 kg 76.2 kg    Intake/Output:   Intake/Output Summary (Last 24 hours) at 05/26/2019 0930 Last data filed at 05/26/2019 0900 Gross per 24 hour  Intake 360 ml  Output 2490 ml  Net -2130 ml      Physical Exam    General:  Well appearing. No resp difficulty HEENT: Normal Neck: Supple. JVP 5-6  . Carotids 2+ bilat; no bruits. No lymphadenopathy or thyromegaly appreciated. Cor: PMI nondisplaced. Tachy Regular rate & rhythm. No rubs, or murmurs. + S3  Lungs: Clear Abdomen: Soft, nontender, nondistended. No hepatosplenomegaly. No bruits or masses. Good bowel sounds. Extremities: No cyanosis, clubbing, rash, edema Neuro: Alert & orientedx3, cranial nerves grossly intact. moves all 4 extremities w/o difficulty. Affect pleasant   Telemetry   Sinus Tach 100s   EKG    N/a   Labs    CBC Recent Labs    05/25/19 1951  WBC 5.7  NEUTROABS 4.3  HGB 12.2*  HCT 39.6  MCV 81.1  PLT [id number]   Basic Metabolic Panel Recent Labs    05/25/19 1951 05/26/19 0500  NA 133* 134*  K 4.9 4.0  CL 101 102  CO2 21* 22  GLUCOSE 127* 137*  BUN 16 20  CREATININE 1.04 1.28*  CALCIUM 9.6 9.1  MG 2.6*  --    Liver Function Tests Recent Labs    05/25/19 1951  AST 31  ALT 39  ALKPHOS 52  BILITOT 1.1    PROT 7.1  ALBUMIN 4.4   No results for input(s): LIPASE, AMYLASE in the last 72 hours. Cardiac Enzymes No results for input(s): CKTOTAL, CKMB, CKMBINDEX, TROPONINI in the last 72 hours.  BNP: BNP (last 3 results) Recent Labs    05/25/19 1951  BNP 1,679.1*    ProBNP (last 3 results) No results for input(s): PROBNP in the last 8760 hours.   D-Dimer No results for input(s): DDIMER in the last 72 hours. Hemoglobin A1C Recent Labs    05/26/19 0500  HGBA1C 7.9*   Fasting Lipid Panel No results for input(s): CHOL, HDL, LDLCALC, TRIG, CHOLHDL, LDLDIRECT in the last 72 hours. Thyroid Function Tests No results for input(s): TSH, T4TOTAL, T3FREE, THYROIDAB in the last 72 hours.  Invalid input(s): FREET3  Other results:   Imaging     No results found.   Medications:     Scheduled Medications: . aspirin EC  81 mg Oral Daily  . atorvastatin  40 mg Oral q1800  . digoxin  0.125 mg Oral Daily  . enoxaparin (LOVENOX) injection  40 mg Subcutaneous Q24H  . latanoprost  1 drop Both Eyes QHS  . losartan  12.5 mg Oral Daily  . pantoprazole  80 mg Oral Daily  . sodium chloride flush  3  mL Intravenous Q12H  . spironolactone  12.5 mg Oral Daily     Infusions: . sodium chloride    . sodium chloride 10 mL/hr at 05/26/19 0435     PRN Medications:  sodium chloride, acetaminophen, albuterol, morphine injection, nitroGLYCERIN, ondansetron (ZOFRAN) IV, sodium chloride flush, traZODone    Assessment/Plan   1. Newly Diagnosed Acute Systolic CHF - Patient presented with shortness of breath, abdominal/chest pain, and apnea and orthopnea. -proBNP elevated at 4000 at [facility].   -Echo at [facility] showed LVEF of 25 to 30% with severe global hypokinesis.  RV also noted to be moderately enlarged with moderately impaired RV systolic function. -Patient was initially diuresed with IV Lasix at [facility] and then transitioned to PO Torsemide but developed hypotension with this  requiring IV fluids. -Volume status improved. RHC/LHC this morning. . -Will hold beta-blocker for now until right/left heart cath tomorrow.  -Continue  Losartan 12.5mg  daily and digoxin 0.125 mg daily. -Continue Spironolactone 12.5mg  daily.   2. Hypertension Stable  3. Hyperlipidemia - Lipid panel from [place]: Total Cholesterol 120, Triglycerides 75, HDL 61, LDL 44.  - Continue Lipitor 40mg  daily.  4. Type 2 Diabetes Mellitus - Hgb  A1C 7.9   - Will hold home metformin and start sliding scale insulin.   5. Abdominal Pain - Abdominal/Pelvic CT and abdominal ultrasound at [facility] unremarkable. - Suspect hepatic congestion secondary to CHF.   6. Asthma - Continue home albuterol and fluticasone inhalers as needed.  - With asthma may need to use bisoprolol down the road once improved.   7. Pain Has been on ultram 100 mg three times a day for some time. Requesting to resume. Followed by MD in [facility] and they are trying to cut back. Restart. Needs follow up at [facility].    Length of Stay: 1  Amy Clegg, NP  05/26/2019, 9:30 AM  Advanced Heart Failure Team Pager [phone] (M-F; 7a - 4p)  Please contact [facility] for night-coverage after hours (4p -7a ) and weekends on [email or web address]  Patient seen and examined with the above-signed Advanced Practice Provider and/or Housestaff. I personally reviewed laboratory data, imaging studies and relevant notes. I independently examined the patient and formulated the important aspects of the plan. I have edited the note to reflect any of my changes or salient points. I have personally discussed the plan with the patient and/or family.  Remains weak and tenuous.   R/L cath today with EF 15%. Normal coronaries and compensated hemodynamics.   Will plan cMRI to further evaluate etiology of severe NICM. May also be PVC related. Needs sleep study.   Glori Bickers, MD  10:58 AM

## 2019-05-26 NOTE — Progress Notes (Signed)
Patient have 11 beats of v tach, asymptomatic patient was having lunch at the  [employer]. Cardiology NP notified will continue to monitor.

## 2019-05-26 NOTE — Plan of Care (Signed)

## 2019-05-27 ENCOUNTER — Ambulatory Visit: Payer: Medicare HMO | Admitting: Cardiology

## 2019-05-27 LAB — BASIC METABOLIC PANEL
Anion gap: 8 (ref 5–15)
BUN: 18 mg/dL (ref 8–23)
CO2: 25 mmol/L (ref 22–32)
Calcium: 9 mg/dL (ref 8.9–10.3)
Chloride: 100 mmol/L (ref 98–111)
Creatinine, Ser: 1.15 mg/dL (ref 0.61–1.24)
GFR calc Af Amer: >60 mL/min (ref >60)
GFR calc non Af Amer: >60 mL/min (ref >60)
Glucose, Bld: 138 mg/dL — ABNORMAL HIGH (ref 70–99)
Potassium: 4.8 mmol/L (ref 3.5–5.1)
Sodium: 133 mmol/L — ABNORMAL LOW (ref 135–145)

## 2019-05-27 LAB — GLUCOSE, CAPILLARY
Glucose-Capillary: 111 mg/dL — ABNORMAL HIGH (ref 70–99)
Glucose-Capillary: 108 mg/dL — ABNORMAL HIGH (ref 70–99)
Glucose-Capillary: 148 mg/dL — ABNORMAL HIGH (ref 70–99)
Glucose-Capillary: 194 mg/dL — ABNORMAL HIGH (ref 70–99)

## 2019-05-27 LAB — MAGNESIUM: Magnesium: 2.1 mg/dL (ref 1.7–2.4)

## 2019-05-27 MED ORDER — AMIODARONE HCL IN DEXTROSE 360-4.14 MG/200ML-% IV SOLN
30.0000 mg/h | INTRAVENOUS | Status: DC
  Administered 2019-05-28 – 2019-05-29 (×3): 30 mg/h via INTRAVENOUS
  Filled 2019-05-27 (×3): qty 200

## 2019-05-27 MED ORDER — AMIODARONE LOAD VIA INFUSION
150.0000 mg | Freq: Once | INTRAVENOUS | Status: AC
  Administered 2019-05-27: 150 mg via INTRAVENOUS
  Filled 2019-05-27: qty 83.34

## 2019-05-27 MED ORDER — AMIODARONE HCL IN DEXTROSE 360-4.14 MG/200ML-% IV SOLN
60.0000 mg/h | INTRAVENOUS | Status: AC
  Administered 2019-05-27 (×2): 60 mg/h via INTRAVENOUS
  Filled 2019-05-27 (×2): qty 200

## 2019-05-27 NOTE — Progress Notes (Addendum)
1520 IV site lost, will establish new IV for Amiodarone.  BP 108/78 (@ 1600), HR 94 QTC 539 at initiation of Amiodarone bolus at 1606. PVCs with non sustained Vtach at onset of bolus. RN remained at bedside for duration of bolus.   QTC 551, HR 97 at 1611.   QTC 541, HR 95 at 1614.  NSR with pair PVCs  Bolus complete at 1616.   BP 97/81 at 1620, HR 96, multiform PVCs with NSR, unable to read QTc per monitor.  QTc 539, HR 98 at 1625. Non sustained VTach and NSR.   Paged [place] at [phone].Amiodarone bolus complete, Ami @ 60mg /hr.Monitoring BP & QTc.Please callback touch base about QTc monitoring. Upon callback at 1632, per [facility] perform EKG in AM, do EKG or doc rhythm strip if changes to rhythm. Upon inquiry, no QTc parameters given; will continue to monitor.  QTc 541, HR 92 at 1641. NSR  QTc 519, HR 108 at 1741.   QTC 529, HR 104 at 1827.

## 2019-05-27 NOTE — Progress Notes (Addendum)
Advanced Heart Failure Rounding Note  PCP-Cardiologist: No primary care provider on file.   Subjective:     New systolic HF. EF 20-25%. RV systolic function mildly reduced. NICM, LHC w/ mild, nonobstructive CAD. Well compensated hemodynamics on RHC.   cMRi w/ nonspecific inferoseptal RV insertion site LGE. ECV percentage elevated at 39%, can see with increased fibrosis with cardiomyopathy or myocarditis. Generally amyloidosis tends to be >40%.  Wt stable. BP tolerating HF meds ok.   Continues w/ frequent PVCs, ~8-10% burden on tele review. At [facility], developed NSVT with polymorphic ectopy. K, Mg and TSH WNL.    _________________________ [facility] 05/26/19 Ost LAD to Prox LAD lesion is 20% stenosed.  Findings:  Ao = 100/72 (86) LV = 103/14 RA =  2 RV = 27/4 PA = 32/12 (22) PCW = 14 Fick cardiac output/index = 6.5/3.3 PVR = 1.2 WU FA sat = 99% PA sat = 75%, 76%  Assessment: 1. Minimal CAD  2. Severe NICM EF 15% 3. Well-compensated hemodynamics  cMRi 05/26/19 IMPRESSION: 1.  Severely dilated LV with EF 18%, diffuse hypokinesis.  2.  Moderately dilated RV with EF 28%.  3. Nonspecific inferoseptal RV insertion site LGE, can be suggestive of pressure overload/CHF.  4. ECV percentage elevated at 39%, can see with increased fibrosis with cardiomyopathy or myocarditis. Generally amyloidosis tends to be >40%.   Objective:   Weight Range: 75.8 kg Body mass index is 23.98 kg/m.   Vital Signs:   Temp:  [97.3 F (36.3 C)-98.4 F (36.9 C)] 97.3 F (36.3 C) (02/19 1123) Pulse Rate:  [99-109] 109 (02/19 1123) Resp:  [17-20] 20 (02/19 1123) BP: (97-123)/(69-91) 110/88 (02/19 1123) SpO2:  [95 %-100 %] 100 % (02/19 1123) Weight:  [75.8 kg] 75.8 kg (02/19 0006) Last BM Date: 05/27/19  Weight change: Filed Weights   05/25/19 1653 05/26/19 0409 05/27/19 0006  Weight: 77.3 kg 76.2 kg 75.8 kg    Intake/Output:   Intake/Output Summary (Last 24 hours) at  05/27/2019 1353 Last data filed at 05/27/2019 1326 Gross per 24 hour  Intake 1200 ml  Output 1000 ml  Net 200 ml      Physical Exam    General:  Well appearing WM. No resp difficulty HEENT: Normal Neck: Supple. No JVP .Carotids 2+ bilat; no bruits. No lymphadenopathy or thyromegaly appreciated. Cor: PMI nondisplaced. irregular rhythm (frequent PVCs), regular rate. No rubs, gallops or murmurs. Lungs: Clear Abdomen: Soft, nontender, nondistended. No hepatosplenomegaly. No bruits or masses. Good bowel sounds. Extremities: No cyanosis, clubbing, rash, edema Neuro: Alert & orientedx3, cranial nerves grossly intact. moves all 4 extremities w/o difficulty. Affect pleasant   Telemetry   NSR w/ frequent polymorphic PVCs, burden ~8-10%  EKG    No new ekg to review   Labs    CBC Recent Labs    05/25/19 1951  WBC 5.7  NEUTROABS 4.3  HGB 12.2*  HCT 39.6  MCV 81.1  PLT [id number]   Basic Metabolic Panel Recent Labs    05/25/19 1951 05/25/19 1951 05/26/19 0500 05/27/19 0424  NA 133*   < > 134* 133*  K 4.9   < > 4.0 4.8  CL 101   < > 102 100  CO2 21*   < > 22 25  GLUCOSE 127*   < > 137* 138*  BUN 16   < > 20 18  CREATININE 1.04   < > 1.28* 1.15  CALCIUM 9.6   < > 9.1 9.0  MG 2.6*  --   --  2.1   < > = values in this interval not displayed.   Liver Function Tests Recent Labs    05/25/19 1951  AST 31  ALT 39  ALKPHOS 52  BILITOT 1.1  PROT 7.1  ALBUMIN 4.4   No results for input(s): LIPASE, AMYLASE in the last 72 hours. Cardiac Enzymes No results for input(s): CKTOTAL, CKMB, CKMBINDEX, TROPONINI in the last 72 hours.  BNP: BNP (last 3 results) Recent Labs    05/25/19 1951  BNP 1,679.1*    ProBNP (last 3 results) No results for input(s): PROBNP in the last 8760 hours.   D-Dimer No results for input(s): DDIMER in the last 72 hours. Hemoglobin A1C Recent Labs    05/26/19 0500  HGBA1C 7.9*   Fasting Lipid Panel No results for input(s): CHOL, HDL,  LDLCALC, TRIG, CHOLHDL, LDLDIRECT in the last 72 hours. Thyroid Function Tests No results for input(s): TSH, T4TOTAL, T3FREE, THYROIDAB in the last 72 hours.  Invalid input(s): FREET3  Other results:   Imaging    MR CARDIAC MORPHOLOGY W WO CONTRAST  Result Date: 05/26/2019 CLINICAL DATA:  Cardiomyopathy of uncertain etiology EXAM: CARDIAC MRI TECHNIQUE: The patient was scanned on a 1.5 Tesla GE magnet. A dedicated cardiac coil was used. Functional imaging was done using Fiesta sequences. 2,3, and 4 chamber views were done to assess for RWMA's. Modified Simpson's rule using a short axis stack was used to calculate an ejection fraction on a dedicated work Conservation officer, nature. The patient received 8 cc of Gadavist. After 10 minutes inversion recovery sequences were used to assess for infiltration and scar tissue. CONTRAST:  Gadavist 8 cc FINDINGS: Limited images of the lung windows showed no gross abnormalities. Small circumferential pericardial effusion. Severely dilated left ventricle with normal wall thickness. Diffuse severe hypokinesis with EF 18%. No LV thrombus noted. The RV was moderately dilated with moderately decreased systolic function, EF 0000000. Moderate left atrial enlargement. Mild right atrial enlargement. Mild mitral regurgitation. Trivial tricuspid regurgitation. Trileaflet aortic valve with no significant regurgitation or stenosis. On delayed enhancement imaging, there was mid-wall late gadolinium enhancement at the basal inferoseptal RV insertion site. Measurements: LVEDV 285 mL LVSV 52 mL LVEF 18% RVEDV 288 mL RVSV 79 mL RVEF 28% ECV 39% IMPRESSION: 1.  Severely dilated LV with EF 18%, diffuse hypokinesis. 2.  Moderately dilated RV with EF 28%. 3. Nonspecific inferoseptal RV insertion site LGE, can be suggestive of pressure overload/CHF. 4. ECV percentage elevated at 39%, can see with increased fibrosis with cardiomyopathy or myocarditis. Generally amyloidosis tends to be  >40%. Dalton Mclean Electronically Signed   By: [name] M.D.   On: 05/26/2019 17:33      Medications:     Scheduled Medications: . aspirin EC  81 mg Oral Daily  . atorvastatin  40 mg Oral q1800  . digoxin  0.125 mg Oral Daily  . enoxaparin (LOVENOX) injection  40 mg Subcutaneous Q24H  . latanoprost  1 drop Both Eyes QHS  . losartan  12.5 mg Oral Daily  . pantoprazole  80 mg Oral Daily  . sodium chloride flush  3 mL Intravenous Q12H  . spironolactone  12.5 mg Oral Daily     Infusions: . sodium chloride       PRN Medications:  sodium chloride, acetaminophen, albuterol, morphine injection, nitroGLYCERIN, ondansetron (ZOFRAN) IV, sodium chloride flush, traMADol, traZODone     Assessment/Plan     1. 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 RV also noted to be moderately enlarged with moderately impaired RV systolic function. -LHC 2/18 w/ mild, nonobstructive CAD. RHC w/ compensated hemodynamics  - cMRi w/ nonspecific inferoseptal RV insertion site LGE. ECV percentage elevated at 39%, can see with increased fibrosis with cardiomyopathy or myocarditis.  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  - will load w/ IV amiodarone x 24 hrs then convert to PO - with low EF and NSVT, will plan LifeVest prior to d/c -Continue  Losartan 12.5mg  daily. Eventual transition to Entresto if BP remains stable.  -Continuedigoxin 0.125 mg daily. -Continue Spironolactone 12.5mg  daily.  -Volume stable. No loop diuretic requirements currently   2. PVCs - per above - Mg, K and Mg WNL - Start IV amiodarone load  - Will need LifeVest at d/c - Needs outpatient sleep study to r/o OSA - Check PFTs w/ DLco  3. Hypertension - Stable on current regimen   4. Hyperlipidemia - Lipid panel from [place]: Total Cholesterol 120, Triglycerides 75, HDL 61, LDL 44.  - Continue  Lipitor 40mg  daily.  5. Type 2 Diabetes Mellitus - Hgb  A1C 7.9   - Will hold home metformin 48 hr post cah   6. Asthma - Continue home albuterol and fluticasone inhalers as needed.  - With asthma may need to use bisoprolol down the road once improved.    Length of Stay: 2  Lyda Jester, PA-C  05/27/2019, 1:53 PM  Advanced Heart Failure Team Pager [phone] (M-F; 7a - 4p)  Please contact [facility] for night-coverage after hours (4p -7a ) and weekends on [email or web address]  Patient seen and examined with the above-signed Advanced Practice Provider and/or Housestaff. I personally reviewed laboratory data, imaging studies and relevant notes. I independently examined the patient and formulated the important aspects of the plan. I have edited the note to reflect any of my changes or salient points. I have personally discussed the plan with the patient and/or family.  Results of cath and cMRI reviewed with him and his wife. He has severe NICM without evidence of significant LGE or infiltrative process on MRI. 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 D/w EP. Will add amiodarone.    Increase losartan to 25 daily. Continue digoxin and spiro. No b-blocker just yet with addition of amio. Will order [place] likely Sunday on po amio once we have 2-3 gram load.   Glori Bickers, MD  2:57 PM

## 2019-05-27 NOTE — Progress Notes (Signed)
Notified MD on call of pt's c/o leg cramps earlier in shift and frequent PVC's as well as runs of PVC's. Early AM labs ordered.

## 2019-05-27 NOTE — Progress Notes (Signed)
Paged HF team at 1250P t. wife Tonya at bedside & would like updates/discuss current plan & any additional info. Upon callback from [place] will  attempt to round within hour. Patient and wife informed.

## 2019-05-27 NOTE — Plan of Care (Signed)

## 2019-05-27 NOTE — Care Management (Signed)
1551 05-27-19 Patient for Life Vest- order submitted to [place] provided to [employer] to make sure insurance authorizes Life Vest. Patient to be fit Sunday prior to transition home. No further needs at this time. Bethena Roys, RN,BSN Case Manager [phone]

## 2019-05-28 ENCOUNTER — Encounter (HOSPITAL_COMMUNITY): Payer: Self-pay | Admitting: Internal Medicine

## 2019-05-28 ENCOUNTER — Other Ambulatory Visit: Payer: Self-pay

## 2019-05-28 LAB — BASIC METABOLIC PANEL
Anion gap: 11 (ref 5–15)
BUN: 19 mg/dL (ref 8–23)
CO2: 21 mmol/L — ABNORMAL LOW (ref 22–32)
Calcium: 9.2 mg/dL (ref 8.9–10.3)
Chloride: 99 mmol/L (ref 98–111)
Creatinine, Ser: 1 mg/dL (ref 0.61–1.24)
GFR calc Af Amer: >60 mL/min (ref >60)
GFR calc non Af Amer: >60 mL/min (ref >60)
Glucose, Bld: 135 mg/dL — ABNORMAL HIGH (ref 70–99)
Potassium: 4.6 mmol/L (ref 3.5–5.1)
Sodium: 131 mmol/L — ABNORMAL LOW (ref 135–145)

## 2019-05-28 LAB — GLUCOSE, CAPILLARY
Glucose-Capillary: 201 mg/dL — ABNORMAL HIGH (ref 70–99)
Glucose-Capillary: 155 mg/dL — ABNORMAL HIGH (ref 70–99)
Glucose-Capillary: 128 mg/dL — ABNORMAL HIGH (ref 70–99)
Glucose-Capillary: 110 mg/dL — ABNORMAL HIGH (ref 70–99)

## 2019-05-28 MED ORDER — LOSARTAN POTASSIUM 25 MG PO TABS
12.5000 mg | ORAL_TABLET | Freq: Two times a day (BID) | ORAL | Status: DC
  Administered 2019-05-28 – 2019-05-29 (×2): 12.5 mg via ORAL
  Filled 2019-05-28 (×2): qty 1

## 2019-05-28 MED ORDER — SPIRONOLACTONE 25 MG PO TABS
25.0000 mg | ORAL_TABLET | Freq: Every day | ORAL | Status: DC
  Administered 2019-05-29: 25 mg via ORAL
  Filled 2019-05-28 (×2): qty 1

## 2019-05-28 MED ORDER — TORSEMIDE 20 MG PO TABS
20.0000 mg | ORAL_TABLET | Freq: Every day | ORAL | Status: DC
  Administered 2019-05-28 – 2019-05-29 (×2): 20 mg via ORAL
  Filled 2019-05-28 (×2): qty 1

## 2019-05-28 MED ORDER — SPIRONOLACTONE 12.5 MG HALF TABLET
12.5000 mg | ORAL_TABLET | Freq: Once | ORAL | Status: AC
  Administered 2019-05-28: 12.5 mg via ORAL
  Filled 2019-05-28: qty 1

## 2019-05-28 NOTE — Progress Notes (Signed)
QTC 549. HR 92 per monitor.

## 2019-05-28 NOTE — Progress Notes (Signed)
Confirmed with Janan Halter who coordinates [place] with our office that it was approved. She indicates it will be fit tonight.

## 2019-05-28 NOTE — Progress Notes (Signed)
Patient ID: Roy Duke, male   DOB: 08/15/1950, 69 y.o.   MRN: [id number]     Advanced Heart Failure Rounding Note  PCP-Cardiologist: No primary care provider on file.   Subjective:     New systolic HF. EF 20-25%. RV systolic function mildly reduced. NICM, LHC w/ mild, nonobstructive CAD. Well compensated hemodynamics on RHC.   cMRi w/ nonspecific inferoseptal RV insertion site LGE. ECV percentage elevated at 39%, can see with increased fibrosis with cardiomyopathy or myocarditis. Generally amyloidosis tends to be >40%.  Wt stable. BP tolerating HF meds ok.  Still feels short of breath with cough.   Continues w/ frequent PVCs, ~8-10% burden on tele review. At [facility], developed NSVT with polymorphic ectopy. K, Mg and TSH WNL.    _________________________ [facility] 05/26/19 Ost LAD to Prox LAD lesion is 20% stenosed.  Findings:  Ao = 100/72 (86) LV = 103/14 RA =  2 RV = 27/4 PA = 32/12 (22) PCW = 14 Fick cardiac output/index = 6.5/3.3 PVR = 1.2 WU FA sat = 99% PA sat = 75%, 76%  Assessment: 1. Minimal CAD  2. Severe NICM EF 15% 3. Well-compensated hemodynamics  cMRi 05/26/19 IMPRESSION: 1.  Severely dilated LV with EF 18%, diffuse hypokinesis.  2.  Moderately dilated RV with EF 28%.  3. Nonspecific inferoseptal RV insertion site LGE, can be suggestive of pressure overload/CHF.  4. ECV percentage elevated at 39%, can see with increased fibrosis with cardiomyopathy or myocarditis. Generally amyloidosis tends to be >40%.   Objective:   Weight Range: 76 kg Body mass index is 24.03 kg/m.   Vital Signs:   Temp:  [97.5 F (36.4 C)-98.2 F (36.8 C)] 97.5 F (36.4 C) (02/20 1018) Pulse Rate:  [89-93] 93 (02/20 1018) Resp:  [12-23] 16 (02/20 0500) BP: (94-114)/(71-87) 106/87 (02/20 1018) SpO2:  [98 %-100 %] 98 % (02/20 0020) Weight:  [76 kg] 76 kg (02/20 0508) Last BM Date: 05/27/19  Weight change: Filed Weights   05/26/19 0409 05/27/19 0006  05/28/19 0508  Weight: 76.2 kg 75.8 kg 76 kg    Intake/Output:   Intake/Output Summary (Last 24 hours) at 05/28/2019 1140 Last data filed at 05/28/2019 1016 Gross per 24 hour  Intake 1605.82 ml  Output 2675 ml  Net -1069.18 ml      Physical Exam   General: NAD Neck: JVP 10 cm, no thyromegaly or thyroid nodule.  Lungs: Clear to auscultation bilaterally with normal respiratory effort. CV: Nondisplaced PMI.  Heart regular S1/S2, no S3/S4, no murmur.  No peripheral edema.   Abdomen: Soft, nontender, no hepatosplenomegaly, no distention.  Skin: Intact without lesions or rashes.  Neurologic: Alert and oriented x 3.  Psych: Normal affect. Extremities: No clubbing or cyanosis.  HEENT: Normal.    Telemetry   NSR, PVCs rare (personally reviewed)  EKG    No new ekg to review   Labs    CBC Recent Labs    05/25/19 1951  WBC 5.7  NEUTROABS 4.3  HGB 12.2*  HCT 39.6  MCV 81.1  PLT [id number]   Basic Metabolic Panel Recent Labs    05/25/19 1951 05/25/19 1951 05/26/19 0500 05/27/19 0424  NA 133*   < > 134* 133*  K 4.9   < > 4.0 4.8  CL 101   < > 102 100  CO2 21*   < > 22 25  GLUCOSE 127*   < > 137* 138*  BUN 16   < > 20 18  CREATININE  1.04   < > 1.28* 1.15  CALCIUM 9.6   < > 9.1 9.0  MG 2.6*  --   --  2.1   < > = values in this interval not displayed.   Liver Function Tests Recent Labs    05/25/19 1951  AST 31  ALT 39  ALKPHOS 52  BILITOT 1.1  PROT 7.1  ALBUMIN 4.4   No results for input(s): LIPASE, AMYLASE in the last 72 hours. Cardiac Enzymes No results for input(s): CKTOTAL, CKMB, CKMBINDEX, TROPONINI in the last 72 hours.  BNP: BNP (last 3 results) Recent Labs    05/25/19 1951  BNP 1,679.1*    ProBNP (last 3 results) No results for input(s): PROBNP in the last 8760 hours.   D-Dimer No results for input(s): DDIMER in the last 72 hours. Hemoglobin A1C Recent Labs    05/26/19 0500  HGBA1C 7.9*   Fasting Lipid Panel No results for input(s):  CHOL, HDL, LDLCALC, TRIG, CHOLHDL, LDLDIRECT in the last 72 hours. Thyroid Function Tests No results for input(s): TSH, T4TOTAL, T3FREE, THYROIDAB in the last 72 hours.  Invalid input(s): FREET3  Other results:   Imaging    No results found.   Medications:     Scheduled Medications: . aspirin EC  81 mg Oral Daily  . atorvastatin  40 mg Oral q1800  . digoxin  0.125 mg Oral Daily  . enoxaparin (LOVENOX) injection  40 mg Subcutaneous Q24H  . latanoprost  1 drop Both Eyes QHS  . losartan  12.5 mg Oral BID  . pantoprazole  80 mg Oral Daily  . sodium chloride flush  3 mL Intravenous Q12H  . spironolactone  12.5 mg Oral Daily  . spironolactone  12.5 mg Oral Once  . [START ON 05/29/2019] spironolactone  25 mg Oral Daily  . torsemide  20 mg Oral Daily    Infusions: . sodium chloride    . amiodarone 30 mg/hr (05/28/19 0305)    PRN Medications: sodium chloride, acetaminophen, albuterol, morphine injection, nitroGLYCERIN, ondansetron (ZOFRAN) IV, sodium chloride flush, traMADol, traZODone     Assessment/Plan     1. Newly Diagnosed Acute Systolic CHF/ Suspect PVC Tachymediated  -Echo at [facility] showed LVEF of 25 to 30% with severe global hypokinesis. RV also noted to be moderately enlarged with moderately impaired RV systolic function. -LHC 2/18 w/ mild, nonobstructive CAD. RHC w/ compensated hemodynamics  - cMRi w/ nonspecific inferoseptal RV insertion site LGE. ECV percentage elevated at 39%, can see with increased fibrosis with cardiomyopathy or myocarditis.  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  - will load w/ IV amiodarone then convert to PO - with low EF and NSVT, will plan LifeVest prior to d/c - Increase losartan to 12.5 mg bid. Eventual transition to Entresto if BP remains stable.  - Continuedigoxin 0.125 mg daily. - Increase spironolactone to 25 mg daily.  - He is at least mildly volume overloaded on exam and short of breath, will start torsemide 20  mg daily.  - Needs BMET today.    2. PVCs - per above, PVCs decreased on telemetry review today.  - Mg, K and Mg WNL - Start IV amiodarone load => to po tomorrow.  - Will need LifeVest at d/c, likely tomorrow.  - Needs outpatient sleep study to r/o OSA  3. Hypertension - Stable on current regimen   4. Hyperlipidemia - Lipid panel from [place]: Total Cholesterol 120, Triglycerides 75, HDL 61, LDL 44.  - Continue Lipitor 40mg  daily.  5.  Type 2 Diabetes Mellitus - Hgb  A1C 7.9   - Will hold home metformin 48 hr post cah   6. Asthma - Continue home albuterol and fluticasone inhalers as needed.  - With asthma may need to use bisoprolol down the road once improved.   Anticipate home tomorrow with Lifevest.    Length of Stay: 3  [name], MD  05/28/2019, 11:40 AM  Advanced Heart Failure Team Pager [phone] (M-F; 7a - 4p)  Please contact [facility] for night-coverage after hours (4p -7a ) and weekends on [email or web address]

## 2019-05-29 DIAGNOSIS — I493 Ventricular premature depolarization: Secondary | ICD-10-CM

## 2019-05-29 LAB — BASIC METABOLIC PANEL
Anion gap: 9 (ref 5–15)
BUN: 23 mg/dL (ref 8–23)
CO2: 28 mmol/L (ref 22–32)
Calcium: 9.2 mg/dL (ref 8.9–10.3)
Chloride: 97 mmol/L — ABNORMAL LOW (ref 98–111)
Creatinine, Ser: 1.27 mg/dL — ABNORMAL HIGH (ref 0.61–1.24)
GFR calc Af Amer: >60 mL/min (ref >60)
GFR calc non Af Amer: 58 mL/min — ABNORMAL LOW (ref >60)
Glucose, Bld: 148 mg/dL — ABNORMAL HIGH (ref 70–99)
Potassium: 5 mmol/L (ref 3.5–5.1)
Sodium: 134 mmol/L — ABNORMAL LOW (ref 135–145)

## 2019-05-29 LAB — GLUCOSE, CAPILLARY
Glucose-Capillary: 127 mg/dL — ABNORMAL HIGH (ref 70–99)
Glucose-Capillary: 97 mg/dL (ref 70–99)

## 2019-05-29 MED ORDER — AMIODARONE HCL 200 MG PO TABS
200.0000 mg | ORAL_TABLET | Freq: Two times a day (BID) | ORAL | Status: DC
  Administered 2019-05-29: 200 mg via ORAL
  Filled 2019-05-29: qty 1

## 2019-05-29 MED ORDER — TORSEMIDE 20 MG PO TABS: 20.0000 mg | ORAL_TABLET | Freq: Every day | ORAL | 1 refills | Status: DC

## 2019-05-29 MED ORDER — AMIODARONE HCL 200 MG PO TABS: ORAL_TABLET | ORAL | 0 refills | Status: DC

## 2019-05-29 MED ORDER — DIGOXIN 125 MCG PO TABS: 0.1250 mg | ORAL_TABLET | Freq: Every day | ORAL | 1 refills | Status: DC

## 2019-05-29 MED ORDER — ASPIRIN 81 MG PO TBEC: 81.0000 mg | DELAYED_RELEASE_TABLET | Freq: Every day | ORAL | 0 refills | Status: AC

## 2019-05-29 MED ORDER — LOSARTAN POTASSIUM 25 MG PO TABS: 12.5000 mg | ORAL_TABLET | Freq: Two times a day (BID) | ORAL | 1 refills | Status: DC

## 2019-05-29 NOTE — Progress Notes (Signed)
Patient ID: Roy Duke, male   DOB: 1950/05/15, 69 y.o.   MRN: [id number]     Advanced Heart Failure Rounding Note  PCP-Cardiologist: No primary care provider on file.   Subjective:     New systolic HF. EF 20-25%. RV systolic function mildly reduced. NICM, LHC w/ mild, nonobstructive CAD. Well compensated hemodynamics on RHC.   cMRi w/ nonspecific inferoseptal RV insertion site LGE. ECV percentage elevated at 39%, can see with increased fibrosis with cardiomyopathy or myocarditis. Generally amyloidosis tends to be >40%.  Wt stable. BP tolerating HF meds ok. Breathing is better today, had some diuresis yesterday with torsemide.    Continues w/ frequent PVCs, ~8-10% burden on tele review. At [facility], developed NSVT with polymorphic ectopy. K, Mg and TSH WNL.    _________________________ [facility] 05/26/19 Ost LAD to Prox LAD lesion is 20% stenosed.  Findings:  Ao = 100/72 (86) LV = 103/14 RA =  2 RV = 27/4 PA = 32/12 (22) PCW = 14 Fick cardiac output/index = 6.5/3.3 PVR = 1.2 WU FA sat = 99% PA sat = 75%, 76%  Assessment: 1. Minimal CAD  2. Severe NICM EF 15% 3. Well-compensated hemodynamics  cMRi 05/26/19 IMPRESSION: 1.  Severely dilated LV with EF 18%, diffuse hypokinesis.  2.  Moderately dilated RV with EF 28%.  3. Nonspecific inferoseptal RV insertion site LGE, can be suggestive of pressure overload/CHF.  4. ECV percentage elevated at 39%, can see with increased fibrosis with cardiomyopathy or myocarditis. Generally amyloidosis tends to be >40%.   Objective:   Weight Range: 74.6 kg Body mass index is 23.59 kg/m.   Vital Signs:   Temp:  [97.6 F (36.4 C)-98.6 F (37 C)] 98 F (36.7 C) (02/21 0832) Pulse Rate:  [78-90] 78 (02/21 0832) Resp:  [12-20] 16 (02/21 0800) BP: (98-114)/(70-88) 98/72 (02/21 0832) SpO2:  [96 %-100 %] 96 % (02/21 0800) Weight:  [74.6 kg] 74.6 kg (02/21 0419) Last BM Date: 05/27/19  Weight change: Filed Weights   05/27/19 0006 05/28/19 0508 05/29/19 0419  Weight: 75.8 kg 76 kg 74.6 kg    Intake/Output:   Intake/Output Summary (Last 24 hours) at 05/29/2019 1121 Last data filed at 05/29/2019 0900 Gross per 24 hour  Intake 1057.09 ml  Output 3700 ml  Net -2642.91 ml      Physical Exam   General: NAD Neck: No JVD, no thyromegaly or thyroid nodule.  Lungs: Clear to auscultation bilaterally with normal respiratory effort. CV: Nondisplaced PMI.  Heart regular S1/S2, no S3/S4, no murmur.  No peripheral edema.   Abdomen: Soft, nontender, no hepatosplenomegaly, no distention.  Skin: Intact without lesions or rashes.  Neurologic: Alert and oriented x 3.  Psych: Normal affect. Extremities: No clubbing or cyanosis.  HEENT: Normal.    Telemetry   NSR, PVCs rare (personally reviewed)  EKG    No new ekg to review   Labs    CBC No results for input(s): WBC, NEUTROABS, HGB, HCT, MCV, PLT in the last 72 hours. Basic Metabolic Panel Recent Labs    05/27/19 0424 05/27/19 0424 05/28/19 1221 05/29/19 0350  NA 133*   < > 131* 134*  K 4.8   < > 4.6 5.0  CL 100   < > 99 97*  CO2 25   < > 21* 28  GLUCOSE 138*   < > 135* 148*  BUN 18   < > 19 23  CREATININE 1.15   < > 1.00 1.27*  CALCIUM 9.0   < >  9.2 9.2  MG 2.1  --   --   --    < > = values in this interval not displayed.   Liver Function Tests No results for input(s): AST, ALT, ALKPHOS, BILITOT, PROT, ALBUMIN in the last 72 hours. No results for input(s): LIPASE, AMYLASE in the last 72 hours. Cardiac Enzymes No results for input(s): CKTOTAL, CKMB, CKMBINDEX, TROPONINI in the last 72 hours.  BNP: BNP (last 3 results) Recent Labs    05/25/19 1951  BNP 1,679.1*    ProBNP (last 3 results) No results for input(s): PROBNP in the last 8760 hours.   D-Dimer No results for input(s): DDIMER in the last 72 hours. Hemoglobin A1C No results for input(s): HGBA1C in the last 72 hours. Fasting Lipid Panel No results for input(s): CHOL,  HDL, LDLCALC, TRIG, CHOLHDL, LDLDIRECT in the last 72 hours. Thyroid Function Tests No results for input(s): TSH, T4TOTAL, T3FREE, THYROIDAB in the last 72 hours.  Invalid input(s): FREET3  Other results:   Imaging    No results found.   Medications:     Scheduled Medications: . amiodarone  200 mg Oral BID  . aspirin EC  81 mg Oral Daily  . atorvastatin  40 mg Oral q1800  . digoxin  0.125 mg Oral Daily  . enoxaparin (LOVENOX) injection  40 mg Subcutaneous Q24H  . latanoprost  1 drop Both Eyes QHS  . losartan  12.5 mg Oral BID  . pantoprazole  80 mg Oral Daily  . sodium chloride flush  3 mL Intravenous Q12H  . spironolactone  12.5 mg Oral Daily  . spironolactone  25 mg Oral Daily  . torsemide  20 mg Oral Daily    Infusions: . sodium chloride      PRN Medications: sodium chloride, acetaminophen, albuterol, morphine injection, nitroGLYCERIN, ondansetron (ZOFRAN) IV, sodium chloride flush, traMADol, traZODone     Assessment/Plan     1. 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 RV also noted to be moderately enlarged with moderately impaired RV systolic function. -LHC 2/18 w/ mild, nonobstructive CAD. RHC w/ compensated hemodynamics  - cMRi w/ nonspecific inferoseptal RV insertion site LGE. ECV percentage elevated at 39%, can see with increased fibrosis with cardiomyopathy or myocarditis.  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  Convert amiodarone to 200 mg bid today.  - with low EF and NSVT, will plan LifeVest prior to d/c - Continue losartan 12.5 mg bid. Eventual transition to [facility] but not yet with soft BP.  - Continuedigoxin 0.125 mg daily. - Continue spironolactone 25 mg daily.  - Continue torsemide 20 mg daily, volume looks ok today.  Creatinine stable.   2. PVCs - per above, PVCs decreased on telemetry review today.  - Mg, K and Mg WNL -  Amiodarone to po today.   - Will need LifeVest at discharge today.  - Needs outpatient sleep study to r/o OSA  3. Hypertension - Stable on current regimen   4. Hyperlipidemia - Lipid panel from [place]: Total Cholesterol 120, Triglycerides 75, HDL 61, LDL 44.  - Continue Lipitor 40mg  daily.  5. Type 2 Diabetes Mellitus - Hgb  A1C 7.9   - Will hold home metformin 48 hr post cah   6. Asthma - Continue home albuterol and fluticasone inhalers as needed.  - With asthma may need to use bisoprolol down the road once improved.   Plan for home today, followup CHF  clinic.  Cardiac meds for home: losartan 12.5 mg bid, digoxin 0.125 daily, spironolactone 25 daily, torsemide 20 daily, ASA 81 daily, atorvastatin 40 daily, amiodarone 200 mg bid x 10 days then 200 mg daily after that.    Length of Stay: [place], MD  05/29/2019, 11:21 AM  Advanced Heart Failure Team Pager [phone] (M-F; 7a - 4p)  Please contact [facility] for night-coverage after hours (4p -7a ) and weekends on [email or web address]

## 2019-05-29 NOTE — Discharge Summary (Signed)
Discharge Summary    Patient ID: Roy Duke,  MRN: [id number], DOB/AGE: 69-Jul-1952 69 y.o.  Admit date: 05/25/2019 Discharge date: 05/29/2019  Primary Care Provider: Patient, No Pcp Per Primary Cardiologist: Roy Bickers, MD  Discharge Diagnoses    Principal Problem:   Acute systolic CHF (congestive heart failure) ([place]) Active Problems:   Hypercholesterolemia   Diabetes mellitus ([place])   Essential hypertension   Frequent PVCs   Allergies Allergies  Allergen Reactions  . Codeine Nausea Only    "makes me feel weird"  . Penicillins Other (See Comments)    States a long time ago, does not recall reaction     Diagnostic Studies/Procedures    R/LHC: 05/26/19   Ost LAD to Prox LAD lesion is 20% stenosed.   Findings:  Ao = 100/72 (86) LV = 103/14 RA =  2 RV = 27/4 PA = 32/12 (22) PCW = 14 Fick cardiac output/index = 6.5/3.3 PVR = 1.2 WU FA sat = 99% PA sat = 75%, 76%  Assessment: 1. Minimal CAD  2. Severe NICM EF 15% 3. Well-compensated hemodynamics  Plan/Discussion:  Will check cardiac MRI and continue work-up for NICM.   Roy Bickers, MD  10:56 AM  TTE: 05/26/19  IMPRESSIONS    1. Left ventricular ejection fraction, by estimation, is 20 to 25%. The  left ventricle has severely decreased function. The left ventricle  demonstrates global hypokinesis. The left ventricular internal cavity size  was moderately dilated. Left  ventricular diastolic parameters are indeterminate.  2. Right ventricular systolic function is mildly reduced. The right  ventricular size is normal. There is normal pulmonary artery systolic  pressure. The estimated right ventricular systolic pressure is Q000111Q mmHg.  3. Left atrial size was moderately dilated.  4. The mitral valve is normal in structure and function. Mild mitral  valve regurgitation. No evidence of mitral stenosis.  5. The aortic valve is tricuspid. Aortic valve regurgitation is not    visualized. No aortic stenosis is present.  6. The inferior vena cava is normal in size with greater than 50%  respiratory variability, suggesting right atrial pressure of 3 mmHg.   Cardiac MRI: 05/26/19  FINDINGS: Limited images of the lung windows showed no gross abnormalities.  Small circumferential pericardial effusion. Severely dilated left ventricle with normal wall thickness. Diffuse severe hypokinesis with EF 18%. No LV thrombus noted. The RV was moderately dilated with moderately decreased systolic function, EF 0000000. Moderate left atrial enlargement. Mild right atrial enlargement. Mild mitral regurgitation. Trivial tricuspid regurgitation. Trileaflet aortic valve with no significant regurgitation or stenosis.  On delayed enhancement imaging, there was mid-wall late gadolinium enhancement at the basal inferoseptal RV insertion site.  Measurements:  LVEDV 285 mL  LVSV 52 mL LVEF 18%  RVEDV 288 mL RVSV 79 mL  RVEF 28%  ECV 39%  IMPRESSION: 1.  Severely dilated LV with EF 18%, diffuse hypokinesis.  2.  Moderately dilated RV with EF 28%.  3. Nonspecific inferoseptal RV insertion site LGE, can be suggestive of pressure overload/CHF.  4. ECV percentage elevated at 39%, can see with increased fibrosis with cardiomyopathy or myocarditis. Generally amyloidosis tends to be >40%.  [name]   _____________   History of Present Illness     Roy Duke is a 69 year old male with a history of hypertension, hyperlipidemia, and diabetes mellitus but no known cardiac history who presented to [facility] on 05/21/2019 with multiple complaints of chest pain, abdominal pain, and shortness of breath and  was discovered to acute systolic CHF with EF of [id number]. He was transferred to [facility] for further management/evaluation of this.   He presented to the [facility] ED on 05/20/2018 2021 after gradual progression of weakness, shortness of breath, upper  abdominal/chest pain for the past month. Patient's main complaints were weakness shortness of breath with activity and with bending over and upper abdominal pain that is also worse when bending over.  He described the abdominal pain as a "pressure."  He also noted some chest discomfort but stated he had trouble differentiating this from the abdominal pain.  He did note some chest discomfort and shortness of breath though with activity over the prior month.  He also noted some nausea, abdominal distention, lower extremity edema.  Some lightheadedness at times but no palpitations or syncope.  He noted an occasional nonproductive cough he attributed to his asthma but denied any fever or recent illnesses.  No abnormal bleeding.  In the [facility] ED, patient tachycardic in the 120's but vitals stable. EKG showed sinus tachycardia. Troponin negative. ProB- BNP elevated at 4770. Chest x-ray showed cardiomegaly with central pulmonary vascular congestion suggestive of mild CHF but no over alveolar pulmonary edema. D-dimer Quant elevated at 1,289. Chest CTA showed a small right pleural effusion but no PE. CT of abdomen/pelvis showed scattered hypodense lesion in the liver and left kidney (technically non-specific although statistically likely to be cysts) but no specific cause for patient's upper abdominal pain.  WBC 4.7, Hgb 11.2, Plts 249. Na 132, K 3.8, Glucose 150, BUN 15, Cr 1.0. Respiratory panel negative for COVID-19 and influenza A/B. Patient was admitted for acute CHF and started on IV Lasix. Echo showed LVEF of 25-30% with severe global hypokinesis. RV also noted to be moderately enlarged with moderately impaired systolic function. Moderate MR and TR also noted.   Patient evaluated by Cardiology (Dr. Bettina Gavia) while at [place]. Physical exam felt to be consistent with decompensated heart failure with hepatic congestion. Patient was switched from an Lisinopril to Losartan in anticipation for need of Entresto and  was started on Spironolactone. Per note, patient was transitioned to oral Torsemide on 05/23/2019. However, patient then developed hypotension with systolic BP in the [id number] requiring 250cc normal saline bolus. Upper abdominal ultrasound on 05/23/2019 showed multiple appendix cysts but was negative for gallstones or biliary dilatation. Patient was doing to be discharged on 05/24/2019 with plan for outpatient left/heart catheterization but started to feel very weak and lightheaded. Orthopstatic vital signs were unremarkable but he was in sinus tachycardia with rates in the low 100's with occasional PVCs. Therefore, he was kept an additional night. Telemetry showed short runs of polymorphic PVCs. Therefore, decision was made to transfer patient to [facility] for further evaluation/management including right/left heart cath.   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  No recreational drug use.  He also had a family history of heart disease on his father's side.  He is father had a cardiac arrest in his 4s and survived to this and ultimately had a pacemaker placed.  His father ended up dying from a stroke in his 44s.  His paternal grandfather died of a heart attack in his 22s.  In his paternal uncle, has a history a CABG. He was admitted to AHF service for further management.  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 RV also noted to be moderately enlarged with moderately impaired RV systolic function. -LHC 2/18 w/ mild, nonobstructive CAD. RHC w/ compensated hemodynamics  - underwent cMRi w/ nonspecific inferoseptal RV insertion site LGE. ECV percentage elevated at 39%, can see with increased fibrosis with cardiomyopathy or myocarditis.  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 He has placed on IV amiodarone with load and then  converted amiodarone to 200 mg bid for 10 days then 200mg  daily.  - with low EF and NSVT he was fitted for a lifevest prior to discharge - Continue losartan 12.5 mg bid. Eventual transition to [facility] but blood pressures were soft during admission.   - Continuedigoxin 0.125 mg daily, spironolactone 25 mg daily and torsemide 20 mg daily  2. PVCs: burden decreased with the addition of amiodarone - Mg, K and Mg remained WNL - as above he was transitioned to amiodarone 200mg  BID for 10 days and then 200mg  daily - Will need outpatient sleep study to r/o OSA  3.Hypertension - Stable on current regimen   4.Hyperlipidemia - Lipid panel from [place]: Total Cholesterol 120, Triglycerides 75, HDL 61, LDL 44.  - Continue Lipitor 40mg  daily.  5.Type 2 Diabetes Mellitus -Hgb A1C 7.9  - Metformin was held 48 hr post cath with SSI coverage. Resumed at discharge.  6.Asthma - Continue home albuterol and fluticasone inhalers as needed. - With asthma may need to use bisoprolol down the road once improved.   Roy Duke was seen by Dr. Aundra Dubin and determined stable for discharge home. Follow up in the office has been arranged. Medications are listed below.   _____________  Discharge Vitals Blood pressure 101/68, pulse 79, temperature 98 F (36.7 C), temperature source Oral, resp. rate 16, height 5\' 10"  (1.778 m), weight 74.6 kg, SpO2 100 %.  Filed Weights   05/27/19 0006 05/28/19 0508 05/29/19 0419  Weight: 75.8 kg 76 kg 74.6 kg    Labs & Radiologic Studies    CBC No results for input(s): WBC, NEUTROABS, HGB, HCT, MCV, PLT in the last 72 hours. Basic Metabolic Panel Recent Labs    05/27/19 0424 05/27/19 0424 05/28/19 1221 05/29/19 0350  NA 133*   < > 131* 134*  K 4.8   < > 4.6 5.0  CL 100   < > 99 97*  CO2 25   < > 21* 28  GLUCOSE 138*   < > 135* 148*  BUN 18   < > 19 23  CREATININE 1.15   < > 1.00 1.27*  CALCIUM 9.0   < > 9.2 9.2  MG 2.1  --   --   --    < >  = values in this interval not displayed.   Liver Function Tests No results for input(s): AST, ALT, ALKPHOS, BILITOT, PROT, ALBUMIN in the last 72 hours. No results for input(s): LIPASE, AMYLASE in the last 72 hours. Cardiac Enzymes No results for input(s): CKTOTAL, CKMB, CKMBINDEX, TROPONINI in the last 72 hours. BNP Invalid input(s): POCBNP D-Dimer No results for input(s): DDIMER in the last 72 hours. Hemoglobin A1C No results for input(s): HGBA1C in the last 72 hours. Fasting Lipid Panel No results for input(s): CHOL, HDL, LDLCALC, TRIG, CHOLHDL, LDLDIRECT in the last 72 hours. Thyroid Function Tests No results for input(s): TSH, T4TOTAL, T3FREE, THYROIDAB in the last 72 hours.  Invalid input(s): FREET3 _____________  CARDIAC CATHETERIZATION  Result Date: 05/26/2019  Ost LAD to Prox LAD lesion is  20% stenosed.  Findings: Ao = 100/72 (86) LV = 103/14 RA =  2 RV = 27/4 PA = 32/12 (22) PCW = 14 Fick cardiac output/index = 6.5/3.3 PVR = 1.2 WU FA sat = 99% PA sat = 75%, 76% Assessment: 1. Minimal CAD 2. Severe NICM EF 15% 3. Well-compensated hemodynamics Plan/Discussion: Will check cardiac MRI and continue work-up for NICM. Roy Bickers, MD 10:56 AM   MR CARDIAC MORPHOLOGY W WO CONTRAST  Result Date: 05/26/2019 CLINICAL DATA:  Cardiomyopathy of uncertain etiology EXAM: CARDIAC MRI TECHNIQUE: The patient was scanned on a 1.5 Tesla GE magnet. A dedicated cardiac coil was used. Functional imaging was done using Fiesta sequences. 2,3, and 4 chamber views were done to assess for RWMA's. Modified Simpson's rule using a short axis stack was used to calculate an ejection fraction on a dedicated work Conservation officer, nature. The patient received 8 cc of Gadavist. After 10 minutes inversion recovery sequences were used to assess for infiltration and scar tissue. CONTRAST:  Gadavist 8 cc FINDINGS: Limited images of the lung windows showed no gross abnormalities. Small circumferential  pericardial effusion. Severely dilated left ventricle with normal wall thickness. Diffuse severe hypokinesis with EF 18%. No LV thrombus noted. The RV was moderately dilated with moderately decreased systolic function, EF 0000000. Moderate left atrial enlargement. Mild right atrial enlargement. Mild mitral regurgitation. Trivial tricuspid regurgitation. Trileaflet aortic valve with no significant regurgitation or stenosis. On delayed enhancement imaging, there was mid-wall late gadolinium enhancement at the basal inferoseptal RV insertion site. Measurements: LVEDV 285 mL LVSV 52 mL LVEF 18% RVEDV 288 mL RVSV 79 mL RVEF 28% ECV 39% IMPRESSION: 1.  Severely dilated LV with EF 18%, diffuse hypokinesis. 2.  Moderately dilated RV with EF 28%. 3. Nonspecific inferoseptal RV insertion site LGE, can be suggestive of pressure overload/CHF. 4. ECV percentage elevated at 39%, can see with increased fibrosis with cardiomyopathy or myocarditis. Generally amyloidosis tends to be >40%. Dalton Mclean Electronically Signed   By: [name] M.D.   On: 05/26/2019 17:33   ECHOCARDIOGRAM COMPLETE  Result Date: 05/26/2019    ECHOCARDIOGRAM REPORT   Patient Name:   Roy Duke Date of Exam: 05/26/2019 Medical Rec #:  [id number]         Height:       70.0 in Accession #:    [id number]        Weight:       167.9 lb Date of Birth:  09/21/1950         BSA:          1.94 m Patient Age:    24 years          BP:           122/91 mmHg Patient Gender: M                 HR:           102 bpm. Exam Location:  Inpatient Procedure: 2D Echo, Cardiac Doppler and Color Doppler Indications:    [id number] Chronic systolic (congestive) heart failure  History:        Patient has no prior history of Echocardiogram examinations.                 Risk Factors:Hypertension and Diabetes.  Sonographer:    Tiffany Dance Referring Phys: [id number] [place]  1. Left ventricular ejection fraction, by estimation, is 20 to 25%. The left ventricle  has severely decreased function.  The left ventricle demonstrates global hypokinesis. The left ventricular internal cavity size was moderately dilated. Left ventricular diastolic parameters are indeterminate.  2. Right ventricular systolic function is mildly reduced. The right ventricular size is normal. There is normal pulmonary artery systolic pressure. The estimated right ventricular systolic pressure is Q000111Q mmHg.  3. Left atrial size was moderately dilated.  4. The mitral valve is normal in structure and function. Mild mitral valve regurgitation. No evidence of mitral stenosis.  5. The aortic valve is tricuspid. Aortic valve regurgitation is not visualized. No aortic stenosis is present.  6. The inferior vena cava is normal in size with greater than 50% respiratory variability, suggesting right atrial pressure of 3 mmHg. FINDINGS  Left Ventricle: Left ventricular ejection fraction, by estimation, is 20 to 25%. The left ventricle has severely decreased function. The left ventricle demonstrates global hypokinesis. The left ventricular internal cavity size was moderately dilated. There is no left ventricular hypertrophy. Left ventricular diastolic parameters are indeterminate. Right Ventricle: The right ventricular size is normal. No increase in right ventricular wall thickness. Right ventricular systolic function is mildly reduced. There is normal pulmonary artery systolic pressure. The tricuspid regurgitant velocity is 2.33 m/s, and with an assumed right atrial pressure of 3 mmHg, the estimated right ventricular systolic pressure is Q000111Q mmHg. Left Atrium: Left atrial size was moderately dilated. Right Atrium: Right atrial size was normal in size. Pericardium: Trivial pericardial effusion is present. Mitral Valve: The mitral valve is normal in structure and function. Mild mitral annular calcification. Mild mitral valve regurgitation. No evidence of mitral valve stenosis. Tricuspid Valve: The tricuspid valve is  normal in structure. Tricuspid valve regurgitation is trivial. Aortic Valve: The aortic valve is tricuspid. Aortic valve regurgitation is not visualized. No aortic stenosis is present. Pulmonic Valve: The pulmonic valve was normal in structure. Pulmonic valve regurgitation is trivial. Aorta: The aortic root is normal in size and structure. Venous: The inferior vena cava is normal in size with greater than 50% respiratory variability, suggesting right atrial pressure of 3 mmHg. IAS/Shunts: No atrial level shunt detected by color flow Doppler.  LEFT VENTRICLE PLAX 2D LVIDd:         6.60 cm LVIDs:         5.40 cm LV PW:         1.10 cm LV IVS:        0.90 cm LVOT diam:     2.20 cm LV SV:         50.94 ml LV SV Index:   42.32 LVOT Area:     3.80 cm  RIGHT VENTRICLE            IVC RV Basal diam:  3.70 cm    IVC diam: 2.00 cm RV Mid diam:    2.20 cm RV S prime:     8.92 cm/s TAPSE (M-mode): 2.5 cm LEFT ATRIUM             Index       RIGHT ATRIUM           Index LA diam:        4.70 cm 2.43 cm/m  RA Area:     24.30 cm LA Vol (A2C):   65.3 ml 33.70 ml/m RA Volume:   74.80 ml  38.60 ml/m LA Vol (A4C):   86.4 ml 44.59 ml/m LA Biplane Vol: 74.8 ml 38.60 ml/m  AORTIC VALVE LVOT Vmax:   77.30 cm/s LVOT Vmean:  52.500 cm/s LVOT VTI:  0.134 m  AORTA Ao Root diam: 3.70 cm Ao Asc diam:  3.10 cm MITRAL VALVE                TRICUSPID VALVE MV Area (PHT): 4.53 cm     TR Peak grad:   21.7 mmHg MV Decel Time: 168 msec     TR Vmax:        233.00 cm/s MV E velocity: 106.00 cm/s                             SHUNTS                             Systemic VTI:  0.13 m                             Systemic Diam: 2.20 cm [name] MD Electronically signed by [name] MD Signature Date/Time: 05/26/2019/3:34:48 PM    Final    Disposition   Pt is being discharged home today in good condition.  Follow-up Plans & Appointments    Follow-up Information    Larey Dresser, MD Follow up.   Specialty: Cardiology Why: The office  will call you with a follow up appt within the next 2 business days.  Contact information: [place] [address] [phone]          Discharge Instructions    Call MD for:  redness, tenderness, or signs of infection (pain, swelling, redness, odor or green/yellow discharge around incision site)   Complete by: As directed    Diet - low sodium heart healthy   Complete by: As directed    Discharge instructions   Complete by: As directed    Radial Site Care Refer to this sheet in the next few weeks. These instructions provide you with information on caring for yourself after your procedure. Your caregiver may also give you more specific instructions. Your treatment has been planned according to current medical practices, but problems sometimes occur. Call your caregiver if you have any problems or questions after your procedure. HOME CARE INSTRUCTIONS You may shower the day after the procedure.Remove the bandage (dressing) and gently wash the site with plain soap and water.Gently pat the site dry.  Do not apply powder or lotion to the site.  Do not submerge the affected site in water for 3 to 5 days.  Inspect the site at least twice daily.  Do not flex or bend the affected arm for 24 hours.  No lifting over 5 pounds (2.3 kg) for 5 days after your procedure.  Do not drive home if you are discharged the same day of the procedure. Have someone else drive you.  You may drive 24 hours after the procedure unless otherwise instructed by your caregiver.  What to expect: Any bruising will usually fade within 1 to 2 weeks.  Blood that collects in the tissue (hematoma) may be painful to the touch. It should usually decrease in size and tenderness within 1 to 2 weeks.  SEEK IMMEDIATE MEDICAL CARE IF: You have unusual pain at the radial site.  You have redness, warmth, swelling, or pain at the radial site.  You have drainage (other than a small amount of blood on the dressing).    You have chills.  You have a fever or persistent symptoms for  more than 72 hours.  You have a fever and your symptoms suddenly get worse.  Your arm becomes pale, cool, tingly, or numb.  You have heavy bleeding from the site. Hold pressure on the site.   For patients with congestive heart failure, we give them these special instructions:  1. Follow a low-salt diet and watch your fluid intake. In general, you should not be taking in more than 2 liters of fluid per day (no more than 8 glasses per day). Some patients are restricted to less than 1.5 liters of fluid per day (no more than 6 glasses per day). This includes sources of water in foods like soup, coffee, tea, milk, etc. 2. Weigh yourself on the same scale at same time of day and keep a log. 3. Call your doctor: (Anytime you feel any of the following symptoms)  - 3-4 pound weight gain in 1-2 days or 2 pounds overnight  - Shortness of breath, with or without a dry hacking cough  - Swelling in the hands, feet or stomach  - If you have to sleep on extra pillows at night in order to breathe   IT IS IMPORTANT TO LET YOUR DOCTOR KNOW EARLY ON IF YOU ARE HAVING SYMPTOMS SO WE CAN HELP YOU!   Increase activity slowly   Complete by: As directed        Discharge Medications     Medication List    STOP taking these medications   carvedilol 6.25 MG tablet Commonly known as: Coreg   lisinopril 20 MG tablet Commonly known as: ZESTRIL   metoprolol succinate 25 MG 24 hr tablet Commonly known as: TOPROL-XL     TAKE these medications   albuterol 108 (90 Base) MCG/ACT inhaler Commonly known as: VENTOLIN HFA Inhale 2 puffs into the lungs every 4 (four) hours as needed for wheezing.   albuterol 0.63 MG/3ML nebulizer solution Commonly known as: ACCUNEB Take 1 ampule by nebulization every 4 (four) hours as needed for shortness of breath.   amiodarone 200 MG tablet Commonly known as: PACERONE Take 200mg  (1 tablet) twice a day for 10  days then reduce to 200mg  (1 tablet) daily   aspirin 81 MG EC tablet Take 1 tablet (81 mg total) by mouth daily. Start taking on: May 30, 2019   atorvastatin 40 MG tablet Commonly known as: LIPITOR Take 40 mg by mouth daily at 6 PM.   digoxin 0.125 MG tablet Commonly known as: LANOXIN Take 1 tablet (0.125 mg total) by mouth daily. Start taking on: May 30, 2019   latanoprost 0.005 % ophthalmic solution Commonly known as: XALATAN Place 1 drop into both eyes at bedtime.   losartan 25 MG tablet Commonly known as: COZAAR Take 0.5 tablets (12.5 mg total) by mouth in the morning and at bedtime.   metFORMIN 500 MG tablet Commonly known as: GLUCOPHAGE Take 500 mg by mouth daily with breakfast.   montelukast 10 MG tablet Commonly known as: SINGULAIR Take 10 mg by mouth daily.   omeprazole 40 MG capsule Commonly known as: PRILOSEC Take 40 mg by mouth daily.   spironolactone 25 MG tablet Commonly known as: ALDACTONE Take 25 mg by mouth daily.   tiZANidine 2 MG tablet Commonly known as: ZANAFLEX Take 2 mg by mouth.   torsemide 20 MG tablet Commonly known as: DEMADEX Take 1 tablet (20 mg total) by mouth daily. Start taking on: May 30, 2019   traMADol 50 MG tablet Commonly known as: ULTRAM Take 100 mg  by mouth every 6 (six) hours.       No                               Did the patient have a percutaneous coronary intervention (stent / angioplasty)?:  No.      Outstanding Labs/Studies   BMET at follow up appt.  Duration of Discharge Encounter   Greater than 30 minutes including physician time.  Signed, Reino Bellis NP-C 05/29/2019, 12:28 PM

## 2019-05-29 NOTE — Progress Notes (Signed)
Discharge instructions reviewed with pt and wife. IV site and tele D/C'd. Life vest applied. Pt stable at this time and discharged to home with family via wheelchair.

## 2019-06-06 ENCOUNTER — Ambulatory Visit (HOSPITAL_COMMUNITY)
Admit: 2019-06-06 | Discharge: 2019-06-06 | Disposition: A | Discharge status: Home / Self Care | Payer: Medicare HMO | Source: Ambulatory Visit | Attending: Cardiology | Admitting: Cardiology

## 2019-06-06 ENCOUNTER — Other Ambulatory Visit: Payer: Self-pay

## 2019-06-06 ENCOUNTER — Encounter (HOSPITAL_COMMUNITY): Payer: Self-pay

## 2019-06-06 VITALS — BP 126/68 | HR 63 | Wt 165.2 lb

## 2019-06-06 DIAGNOSIS — Z79899 Other long term (current) drug therapy: Secondary | ICD-10-CM | POA: Diagnosis not present

## 2019-06-06 DIAGNOSIS — Z880 Allergy status to penicillin: Secondary | ICD-10-CM | POA: Diagnosis not present

## 2019-06-06 DIAGNOSIS — I110 Hypertensive heart disease with heart failure: Secondary | ICD-10-CM | POA: Insufficient documentation

## 2019-06-06 DIAGNOSIS — E785 Hyperlipidemia, unspecified: Secondary | ICD-10-CM | POA: Diagnosis not present

## 2019-06-06 DIAGNOSIS — Z7982 Long term (current) use of aspirin: Secondary | ICD-10-CM | POA: Diagnosis not present

## 2019-06-06 DIAGNOSIS — I4510 Unspecified right bundle-branch block: Secondary | ICD-10-CM | POA: Insufficient documentation

## 2019-06-06 DIAGNOSIS — Z7984 Long term (current) use of oral hypoglycemic drugs: Secondary | ICD-10-CM | POA: Insufficient documentation

## 2019-06-06 DIAGNOSIS — Z8249 Family history of ischemic heart disease and other diseases of the circulatory system: Secondary | ICD-10-CM | POA: Insufficient documentation

## 2019-06-06 DIAGNOSIS — R0683 Snoring: Secondary | ICD-10-CM | POA: Diagnosis not present

## 2019-06-06 DIAGNOSIS — E119 Type 2 diabetes mellitus without complications: Secondary | ICD-10-CM | POA: Insufficient documentation

## 2019-06-06 DIAGNOSIS — I5022 Chronic systolic (congestive) heart failure: Secondary | ICD-10-CM | POA: Diagnosis not present

## 2019-06-06 DIAGNOSIS — I2510 Atherosclerotic heart disease of native coronary artery without angina pectoris: Secondary | ICD-10-CM | POA: Diagnosis not present

## 2019-06-06 DIAGNOSIS — Z885 Allergy status to narcotic agent status: Secondary | ICD-10-CM | POA: Diagnosis not present

## 2019-06-06 DIAGNOSIS — I493 Ventricular premature depolarization: Secondary | ICD-10-CM | POA: Diagnosis not present

## 2019-06-06 DIAGNOSIS — I428 Other cardiomyopathies: Secondary | ICD-10-CM | POA: Insufficient documentation

## 2019-06-06 DIAGNOSIS — Z87891 Personal history of nicotine dependence: Secondary | ICD-10-CM | POA: Insufficient documentation

## 2019-06-06 LAB — BASIC METABOLIC PANEL
Anion gap: 9 (ref 5–15)
BUN: 25 mg/dL — ABNORMAL HIGH (ref 8–23)
CO2: 26 mmol/L (ref 22–32)
Calcium: 9.5 mg/dL (ref 8.9–10.3)
Chloride: 95 mmol/L — ABNORMAL LOW (ref 98–111)
Creatinine, Ser: 1.11 mg/dL (ref 0.61–1.24)
GFR calc Af Amer: >60 mL/min (ref >60)
GFR calc non Af Amer: >60 mL/min (ref >60)
Glucose, Bld: 105 mg/dL — ABNORMAL HIGH (ref 70–99)
Potassium: 4.4 mmol/L (ref 3.5–5.1)
Sodium: 130 mmol/L — ABNORMAL LOW (ref 135–145)

## 2019-06-06 LAB — CBC
HCT: 42.1 % (ref 39.0–52.0)
Hemoglobin: 13.8 g/dL (ref 13.0–17.0)
MCH: 26.3 pg (ref 26.0–34.0)
MCHC: 32.8 g/dL (ref 30.0–36.0)
MCV: 80.3 fL (ref 80.0–100.0)
Platelets: 294 10*3/uL (ref 150–400)
RBC: 5.24 MIL/uL (ref 4.22–5.81)
RDW: 16.4 % — ABNORMAL HIGH (ref 11.5–15.5)
WBC: 5.8 10*3/uL (ref 4.0–10.5)
nRBC: 0 % (ref 0.0–0.2)

## 2019-06-06 LAB — DIGOXIN LEVEL: Digoxin Level: 0.6 ng/mL — ABNORMAL LOW (ref 0.8–2.0)

## 2019-06-06 MED ORDER — AMIODARONE HCL 200 MG PO TABS: 200.0000 mg | ORAL_TABLET | Freq: Every day | ORAL | 0 refills | Status: DC

## 2019-06-06 MED ORDER — TORSEMIDE 20 MG PO TABS: 20.0000 mg | ORAL_TABLET | Freq: Every day | ORAL | 1 refills | Status: DC | PRN

## 2019-06-06 NOTE — Addendum Note (Signed)
Encounter addended by: Scarlette Calico, RN on: 06/06/2019 3:28 PM  Actions taken: Clinical Note Signed

## 2019-06-06 NOTE — Patient Instructions (Signed)
Change Torsemide to AS NEEDED only for weight gain of 3 lbs in 24 hours or 5 lbs in a week  Decrease Amiodarone to 200 mg DAILY starting tomorrow, 06/07/19  Labs done today, we will notify you of any abnormal labs  Your provider has recommended that you have a home sleep study.  [employer] is the company that does these test.  They will contact you by phone and must speak with you before they can ship the equipment.  Once they have spoken with you they will send the equipment right to your home with instructions on how to set it up.  Once you have completed the test simply box all the equipment back up and mail back to the company.  IF you have any questions or issues with the equipment please call the company directly at [phone].  If your test is positive for sleep apnea and you need a home CPAP machine you will be contacted by Dr Theodosia Blender office [facility]) to set this up.  Your physician recommends that you schedule a follow-up appointment in: 2-3 weeks   If you have any questions or concerns before your next appointment please send [place] a message through [place] or call our office at [phone].  At the [facility], you and your health needs are our priority. As part of our continuing mission to provide you with exceptional heart care, we have created designated Provider Care Teams. These Care Teams include your primary Cardiologist (physician) and Advanced Practice Providers (APPs- Physician Assistants and Nurse Practitioners) who all work together to provide you with the care you need, when you need it.   You may see any of the following providers on your designated Care Team at your next follow up: [name] Dr Glori Bickers . Dr [name] . Darrick Grinder, NP . Lyda Jester, PA . Audry Riles, PharmD   Please be sure to bring in all your medications bottles to every appointment.

## 2019-06-06 NOTE — Progress Notes (Signed)
Advanced Heart Failure Clinic Note   Referring Physician: PCP: Patient, No Pcp Per PCP-Cardiologist: Dr. Althea Grimmer, MD   Reason for Visit: [facility] F/u for New Systolic HF (NICM)  HPI:  69 y/o male with HTN, HL, DM2, former tobacco and ETOH use (quit 30 years ago).   Admitted in 12/20 with severe hyponatremia. Since that time, he developed progressive weakness, exertional dyspnea, LE edema and bendopnea. He denied CP but had ab pain with exertion. Subsequently, he was admitted to [facility] 05/2019 and found to have new systolic HF.  Echo with EF 25%. Developed NSVT with polymorphic ectopy. ECG with RBBB and new lateral TWI. He was transferred to [facility] for [facility] and AHF consultation.   He was diagnosed w/ a NICM. LHC w/ mild, nonobstructive CAD. Well compensated hemodynamics on RHC. cMRi w/ nonspecific inferoseptal RV insertion site LGE. ECV percentage elevated at 39% (can see with increased fibrosis with cardiomyopathy or myocarditis. Generally amyloidosis tends to be >40%.). 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 K, Mg and TSH WNL. 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 He was started on amiodarone for suppression of PVCs and fitted w/ a LifeVest prior to discharge. He was also discharged home on GDMT w/ losartan, spironolactone and digoxin. BP was too soft during hospitalization for Entresto. He was also discharged home on torsemide. Recommendations were to arrange for sleep study w/ r/o OSA given frequent PVCs.   He presents to clinic today for post hospital f/u. Here w/ wife. In a wheelchair. Doing a tad bit better but still feels weak and continues w/ exertional dyspnea/ fatigue w/ mild-moderate activity. No resting dyspnea. No orthopnea/ PND. Reports full compliance w/ meds. Checking wt daily and has lost ~5 lb in the last week. BP this am is 126/68, prior to AM meds. Compliant w/ amiodarone. Scheduled to reduce dose down to 200 mg qd  starting tomorrow. No side effects. He has been compliant w/ Lifevest. No shocks.     Cardiac Studies  2D echo 05/2019 Left ventricular ejection fraction, by estimation, is 20 to 25%. The left ventricle has severely decreased function. The left ventricle demonstrates global hypokinesis. The left ventricular internal cavity size was moderately dilated. Left ventricular diastolic parameters are indeterminate. 2. Right ventricular systolic function is mildly reduced. The right ventricular size is normal. There is normal pulmonary artery systolic pressure. The estimated right ventricular systolic pressure is [id number] mmHg. 3. Left atrial size was moderately dilated. 4. The mitral valve is normal in structure and function. Mild mitral valve regurgitation. No evidence of mitral stenosis. 5. The aortic valve is tricuspid. Aortic valve regurgitation is not visualized. No aortic stenosis is present. 6. The inferior vena cava is normal in size with greater than 50% respiratory variability, suggesting right atrial pressure of 3 mmHg.   [facility] 05/26/19 Ost LAD to Prox LAD lesion is 20% stenosed.  Findings:  Ao = 100/72 (86) LV = 103/14 RA = 2 RV = 27/4 PA = 32/12 (22) PCW = 14 Fick cardiac output/index = 6.5/3.3 PVR = 1.2 WU FA sat = 99% PA sat = 75%, 76%  Assessment: 1. Minimal CAD  2. Severe NICM EF 15% 3. Well-compensated hemodynamics  cMRi 05/26/19 IMPRESSION: 1. Severely dilated LV with EF 18%, diffuse hypokinesis.  2. Moderately dilated RV with EF 28%.  3. Nonspecific inferoseptal RV insertion site LGE, can be suggestive of pressure overload/CHF.  4. ECV percentage elevated at 39%, can see with  increased fibrosis with cardiomyopathy or myocarditis. Generally amyloidosis tends to be >40%.    Review of Systems: [y] = yes, [ ]  = no   General: Weight gain [ ] ; Weight loss [ ] ; Anorexia [ ] ; Fatigue [ ] ; Fever [ ] ; Chills [ ] ; Weakness [ ]   Cardiac: Chest  pain/pressure [ ] ; Resting SOB [ ] ; Exertional SOB [ ] ; Orthopnea [ ] ; Pedal Edema [ ] ; Palpitations [ ] ; Syncope [ ] ; Presyncope [ ] ; Paroxysmal nocturnal dyspnea[ ]   Pulmonary: Cough [ ] ; Wheezing[ ] ; Hemoptysis[ ] ; Sputum [ ] ; Snoring [ ]   GI: Vomiting[ ] ; Dysphagia[ ] ; Melena[ ] ; Hematochezia [ ] ; Heartburn[ ] ; Abdominal pain [ ] ; Constipation [ ] ; Diarrhea [ ] ; BRBPR [ ]   GU: Hematuria[ ] ; Dysuria [ ] ; Nocturia[ ]   Vascular: Pain in legs with walking [ ] ; Pain in feet with lying flat [ ] ; Non-healing sores [ ] ; Stroke [ ] ; TIA [ ] ; Slurred speech [ ] ;  Neuro: Headaches[ ] ; Vertigo[ ] ; Seizures[ ] ; Paresthesias[ ] ;Blurred vision [ ] ; Diplopia [ ] ; Vision changes [ ]   Ortho/Skin: Arthritis [ ] ; Joint pain [ ] ; Muscle pain [ ] ; Joint swelling [ ] ; Back Pain [ ] ; Rash [ ]   Psych: Depression[ ] ; Anxiety[ ]   Heme: Bleeding problems [ ] ; Clotting disorders [ ] ; Anemia [ ]   Endocrine: Diabetes [ ] ; Thyroid dysfunction[ ]    Past Medical History:  Diagnosis Date  . Diabetes mellitus without complication ([place])   . Hypertension     Current Outpatient Medications  Medication Sig Dispense Refill  . albuterol (ACCUNEB) 0.63 MG/3ML nebulizer solution Take 1 ampule by nebulization every 4 (four) hours as needed for shortness of breath.     [name] albuterol (VENTOLIN HFA) 108 (90 Base) MCG/ACT inhaler Inhale 2 puffs into the lungs every 4 (four) hours as needed for wheezing.    [name] amiodarone (PACERONE) 200 MG tablet Take 200mg  (1 tablet) twice a day for 10 days then reduce to 200mg  (1 tablet) daily 60 tablet 0  . aspirin EC 81 MG EC tablet Take 1 tablet (81 mg total) by mouth daily. 90 tablet 0  . atorvastatin (LIPITOR) 40 MG tablet Take 40 mg by mouth daily at 6 PM.     . digoxin (LANOXIN) 0.125 MG tablet Take 1 tablet (0.125 mg total) by mouth daily. 30 tablet 1  . latanoprost (XALATAN) 0.005 % ophthalmic solution Place 1 drop into both eyes at bedtime.    [name] losartan (COZAAR) 25 MG tablet Take 0.5 tablets  (12.5 mg total) by mouth in the morning and at bedtime. 30 tablet 1  . metFORMIN (GLUCOPHAGE) 500 MG tablet Take 500 mg by mouth daily with breakfast.    . montelukast (SINGULAIR) 10 MG tablet Take 10 mg by mouth daily.    [name] omeprazole (PRILOSEC) 40 MG capsule Take 40 mg by mouth daily.    [name] spironolactone (ALDACTONE) 25 MG tablet Take 25 mg by mouth daily.     [name] tiZANidine (ZANAFLEX) 2 MG tablet Take 2 mg by mouth.     . torsemide (DEMADEX) 20 MG tablet Take 1 tablet (20 mg total) by mouth daily. 30 tablet 1  . traMADol (ULTRAM) 50 MG tablet Take 100 mg by mouth every 6 (six) hours.     No current facility-administered medications for this encounter.    Allergies  Allergen Reactions  . Codeine Nausea Only    "makes me feel weird"  . Penicillins Other (See Comments)  States a long time ago, does not recall reaction       Social History   Socioeconomic History  . Marital status: Married    Spouse name: Not on file  . Number of children: Not on file  . Years of education: Not on file  . Highest education level: Not on file  Occupational History  . Not on file  Tobacco Use  . Smoking status: Former Smoker    Types: Cigarettes    Quit date: 04/07/1989    Years since quitting: 30.1  . Smokeless tobacco: Never Used  Substance and Sexual Activity  . Alcohol use: Not Currently  . Drug use: Not Currently  . Sexual activity: Yes  Other Topics Concern  . Not on file  Social History Narrative  . Not on file   Social Determinants of Health   Financial Resource Strain:   . Difficulty of Paying Living Expenses: Not on file  Food Insecurity:   . Worried About Charity fundraiser in the Last Year: Not on file  . Ran Out of Food in the Last Year: Not on file  Transportation Needs:   . Lack of Transportation (Medical): Not on file  . Lack of Transportation (Non-Medical): Not on file  Physical Activity:   . Days of Exercise per Week: Not on file  . Minutes of Exercise per  Session: Not on file  Stress:   . Feeling of Stress : Not on file  Social Connections:   . Frequency of Communication with Friends and Family: Not on file  . Frequency of Social Gatherings with Friends and Family: Not on file  . Attends Religious Services: Not on file  . Active Member of Clubs or Organizations: Not on file  . Attends Archivist Meetings: Not on file  . Marital Status: Not on file  Intimate Partner Violence:   . Fear of Current or Ex-Partner: Not on file  . Emotionally Abused: Not on file  . Physically Abused: Not on file  . Sexually Abused: Not on file      Family History  Problem Relation Age of Onset  . Heart disease Father        cardiac arrest in his 44's - surviced this and had PPM placed.   [name] Heart attack Paternal Grandfather   . CAD Paternal Uncle        CABG    Vitals:   06/06/19 0909  BP: 126/68  Pulse: 63  SpO2: 98%  Weight: 75 kg (165 lb 4 oz)     PHYSICAL EXAM: General:  Well appearing, thin elderly WM. No respiratory difficulty HEENT: normal Neck: supple. no JVD. Carotids 2+ bilat; no bruits. No lymphadenopathy or thyromegaly appreciated. Cor: PMI nondisplaced. Regular rate & rhythm. No rubs, gallops or murmurs. + LifeVest Lungs: clear Abdomen: soft, nontender, nondistended. No hepatosplenomegaly. No bruits or masses. Good bowel sounds. Extremities: no cyanosis, clubbing, rash, edema Neuro: alert & oriented x 3, cranial nerves grossly intact. moves all 4 extremities w/o difficulty. Affect pleasant.  ECG: NSR w/ few PVCs 79 bpm    ASSESSMENT & PLAN:  1.Chronic Systolic Heart Failure (NICM) -Echo at [facility] 05/2019 showed LVEF of 25 to 30% with severe global hypokinesis. RV also noted to be moderately enlarged with moderately impaired RV systolic function. -LHC 2/18 w/ mild, nonobstructive CAD. RHC w/ compensated hemodynamics  - cMRi w/ nonspecific inferoseptal RV insertion site LGE. ECV percentage elevated at 39%, can see  with increased fibrosis with  cardiomyopathy or myocarditis.  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   - Continue amiodarone to 200 mg bid. Reduce down to 200 mg qd starting 3/2.  - Continue w/ LifeVest for primary prevention until repeat echo  - Volume status good. He has lost 5 additional lb since d/c and complaints of fatigue/ low BP. May be a bit dry. Will check BMP today and will change torsemide from daily to PRN based on wt gain of >3 lb in 24 hrs or > 5 lb in 1 week - His BP is currently too soft to push meds further.  - Continue losartan 12.5 mg bid. Eventual transition to [facility] but not yet with soft BP.  - Continuedigoxin 0.125 mg daily. Check dig level today - Continue spironolactone 25 mg daily.  - Will need repeat echo in 3 months (May)   2. PVCs - high burden ~8-10% 2/21 - Mg, K and TSH - Continue PO Amiodarone for suppression (will need f/u HFTs and TFTs again in 3 months) - Continue w/ LifeVest   - Will arrange outpatient sleep study to r/o OSA  3.Hypertension - Stable on current regimen, SBPs in the low 100s  - Check BMP today   4.Hyperlipidemia - Lipid panel from [place]: Total Cholesterol 120, Triglycerides 75, HDL 61, LDL 44.  - LHC showed mild nonobstructive CAD  - Continue Lipitor 40mg  qhs. General Cardiology to continue to follow   5.Type 2 Diabetes Mellitus -Hgb A1C 7.9  - Continue Metformin  - Consider addition of SGLT2i in future   F/u again in 2-3 weeks for further titration of HF meds, if BP allows.   Lyda Jester, PA-C 06/06/19

## 2019-06-06 NOTE — Progress Notes (Signed)
During pt's OV this AM, he had FMLA forms for being out of work and stated his job needed it back asap.  Forms completed during OV this am, signed by Lyda Jester, PA and given to pt.  He also left some STD forms from [employer], he request they be completed, signed and faxed into them, aware we would let them know once complete.

## 2019-06-06 NOTE — Progress Notes (Signed)
Height:  5'10"    Weight: 165 lb BMI: 23.7  Today's Date: 06/06/19  STOP BANG RISK ASSESSMENT S (snore) Have you been told that you snore?     YES   T (tired) Are you often tired, fatigued, or sleepy during the day?   YES  O (obstruction) Do you stop breathing, choke, or gasp during sleep? NO   P (pressure) Do you have or are you being treated for high blood pressure? NO   B (BMI) Is your body index greater than 35 kg/m? NO   A (age) Are you 69 years old or older? YES   N (neck) Do you have a neck circumference greater than 16 inches?      G (gender) Are you a male? YES   TOTAL STOP/BANG "YES" ANSWERS 4                                                                       For Office Use Only              Procedure Order Form    YES to 3+ Stop Bang questions OR two clinical symptoms - patient qualifies for WatchPAT (CPT 95800)      Clinical Notes: Will consult Sleep Specialist and refer for management of therapy due to patient increased risk of Sleep Apnea. Ordering a sleep study due to the following two clinical symptoms: Excessive daytime sleepiness G47.10 / Loud snoring R06.83  Which test do you need, WP1 or WP300???  . Do you have access to a smart device containing the app stores?     If NO, then  --->WP300

## 2019-06-06 NOTE — Addendum Note (Signed)
Encounter addended by: Scarlette Calico, RN on: 06/06/2019 3:36 PM  Actions taken: Clinical Note Signed

## 2019-06-08 ENCOUNTER — Telehealth (HOSPITAL_COMMUNITY): Payer: Self-pay | Admitting: *Deleted

## 2019-06-08 NOTE — Telephone Encounter (Signed)
Order, OV note, stop bang and demographics all faxed to [employer] at [phone]  

## 2019-06-14 ENCOUNTER — Encounter (HOSPITAL_COMMUNITY): Payer: Self-pay | Admitting: *Deleted

## 2019-06-14 NOTE — Progress Notes (Signed)
Pt's STD forms from [employer] have been completed and signed by Dr Haroldine Laws, faxed to them at [phone].   Pt's wife is aware this has been done, copy saved to give them at next OV

## 2019-06-16 ENCOUNTER — Telehealth (HOSPITAL_COMMUNITY): Payer: Self-pay | Admitting: Cardiology

## 2019-06-16 NOTE — Telephone Encounter (Signed)
Patient called to report he has had two flutter episodes since decreasing amio. Denies pain or SOB Vitals stable at home  Advised would forward to provider for further recommendations

## 2019-06-19 ENCOUNTER — Encounter (INDEPENDENT_AMBULATORY_CARE_PROVIDER_SITE_OTHER): Payer: Medicare HMO | Admitting: Cardiology

## 2019-06-19 DIAGNOSIS — I10 Essential (primary) hypertension: Secondary | ICD-10-CM

## 2019-06-19 DIAGNOSIS — I5021 Acute systolic (congestive) heart failure: Secondary | ICD-10-CM

## 2019-06-21 ENCOUNTER — Encounter (HOSPITAL_COMMUNITY): Payer: Self-pay

## 2019-06-21 ENCOUNTER — Ambulatory Visit (HOSPITAL_COMMUNITY)
Admission: RE | Admit: 2019-06-21 | Discharge: 2019-06-21 | Disposition: A | Discharge status: Home / Self Care | Payer: Medicare HMO | Source: Ambulatory Visit | Attending: Cardiology | Admitting: Cardiology

## 2019-06-21 ENCOUNTER — Other Ambulatory Visit: Payer: Self-pay

## 2019-06-21 VITALS — BP 142/96 | HR 74 | Wt 165.8 lb

## 2019-06-21 DIAGNOSIS — Z7984 Long term (current) use of oral hypoglycemic drugs: Secondary | ICD-10-CM | POA: Insufficient documentation

## 2019-06-21 DIAGNOSIS — Z79899 Other long term (current) drug therapy: Secondary | ICD-10-CM | POA: Insufficient documentation

## 2019-06-21 DIAGNOSIS — I428 Other cardiomyopathies: Secondary | ICD-10-CM | POA: Insufficient documentation

## 2019-06-21 DIAGNOSIS — E785 Hyperlipidemia, unspecified: Secondary | ICD-10-CM | POA: Diagnosis not present

## 2019-06-21 DIAGNOSIS — I5023 Acute on chronic systolic (congestive) heart failure: Secondary | ICD-10-CM

## 2019-06-21 DIAGNOSIS — Z87891 Personal history of nicotine dependence: Secondary | ICD-10-CM | POA: Insufficient documentation

## 2019-06-21 DIAGNOSIS — E871 Hypo-osmolality and hyponatremia: Secondary | ICD-10-CM | POA: Diagnosis not present

## 2019-06-21 DIAGNOSIS — I110 Hypertensive heart disease with heart failure: Secondary | ICD-10-CM | POA: Diagnosis not present

## 2019-06-21 DIAGNOSIS — I493 Ventricular premature depolarization: Secondary | ICD-10-CM

## 2019-06-21 DIAGNOSIS — Z7982 Long term (current) use of aspirin: Secondary | ICD-10-CM | POA: Insufficient documentation

## 2019-06-21 DIAGNOSIS — Z8249 Family history of ischemic heart disease and other diseases of the circulatory system: Secondary | ICD-10-CM | POA: Diagnosis not present

## 2019-06-21 DIAGNOSIS — I5022 Chronic systolic (congestive) heart failure: Secondary | ICD-10-CM | POA: Insufficient documentation

## 2019-06-21 DIAGNOSIS — I2510 Atherosclerotic heart disease of native coronary artery without angina pectoris: Secondary | ICD-10-CM | POA: Insufficient documentation

## 2019-06-21 DIAGNOSIS — I4510 Unspecified right bundle-branch block: Secondary | ICD-10-CM | POA: Insufficient documentation

## 2019-06-21 DIAGNOSIS — E119 Type 2 diabetes mellitus without complications: Secondary | ICD-10-CM | POA: Diagnosis not present

## 2019-06-21 LAB — BASIC METABOLIC PANEL
Anion gap: 9 (ref 5–15)
BUN: 16 mg/dL (ref 8–23)
CO2: 26 mmol/L (ref 22–32)
Calcium: 9 mg/dL (ref 8.9–10.3)
Chloride: 101 mmol/L (ref 98–111)
Creatinine, Ser: 1.12 mg/dL (ref 0.61–1.24)
GFR calc Af Amer: >60 mL/min (ref >60)
GFR calc non Af Amer: >60 mL/min (ref >60)
Glucose, Bld: 122 mg/dL — ABNORMAL HIGH (ref 70–99)
Potassium: 4.7 mmol/L (ref 3.5–5.1)
Sodium: 136 mmol/L (ref 135–145)

## 2019-06-21 MED ORDER — CARVEDILOL 6.25 MG PO TABS: 6.2500 mg | ORAL_TABLET | Freq: Two times a day (BID) | ORAL | 11 refills | Status: DC

## 2019-06-21 NOTE — Patient Instructions (Signed)
RESTART Coreg 6,25 mg, one tab twice daily  Labs today We will only contact you if something comes back abnormal or we need to make some changes. Otherwise no news is good news!  Your physician recommends that you schedule a follow-up appointment in: 2-3 weeks with Lauren Kemp,PharmD  Do the following things EVERYDAY: 1) Weigh yourself in the morning before breakfast. Write it down and keep it in a log. 2) Take your medicines as prescribed 3) Eat low salt foods--Limit salt (sodium) to 2000 mg per day.  4) Stay as active as you can everyday 5) Limit all fluids for the day to less than 2 liters  At the [facility], you and your health needs are our priority. As part of our continuing mission to provide you with exceptional heart care, we have created designated Provider Care Teams. These Care Teams include your primary Cardiologist (physician) and Advanced Practice Providers (APPs- Physician Assistants and Nurse Practitioners) who all work together to provide you with the care you need, when you need it.   You may see any of the following providers on your designated Care Team at your next follow up: [name] Dr Glori Bickers . Dr [name] . Darrick Grinder, NP . Lyda Jester, PA . Audry Riles, PharmD   Please be sure to bring in all your medications bottles to every appointment.

## 2019-06-21 NOTE — Progress Notes (Signed)
Advanced Heart Failure Clinic Note   Referring Physician: PCP: Patient, No Pcp Per PCP-Cardiologist: Dr. Althea Grimmer, MD   Reason for Visit:  F/u for Systolic Heart Failure and PVCs   HPI:  69 y/o male with HTN, HL, DM2, former tobacco and ETOH use (quit 30 years ago).   Admitted in 12/20 with severe hyponatremia. Since that time, he developed progressive weakness, exertional dyspnea, LE edema and bendopnea. He denied CP but had ab pain with exertion. Subsequently, he was admitted to [facility] 05/2019 and found to have new systolic HF.  Echo with EF 25%. Developed NSVT with polymorphic ectopy. ECG with RBBB and new lateral TWI. He was transferred to [facility] for [facility] and AHF consultation.   He was diagnosed w/ a NICM. LHC w/ mild, nonobstructive CAD. Well compensated hemodynamics on RHC. cMRi w/ nonspecific inferoseptal RV insertion site LGE. ECV percentage elevated at 39% (can see with increased fibrosis with cardiomyopathy or myocarditis. Generally amyloidosis tends to be >40%.). 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 K, Mg and TSH WNL. 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 He was started on amiodarone for suppression of PVCs and fitted w/ a LifeVest prior to discharge. He was also discharged home on GDMT w/ losartan, spironolactone and digoxin. BP was too soft during hospitalization for Entresto. He was also discharged home on torsemide. Recommendations were to arrange for sleep study w/ r/o OSA given frequent PVCs.   He had initial post hospital f/u on 3/1. He reported feeling week/fatigue and continued with mild exertional dyspnea. No dyspnea at rest. No orthopnea/PND. He had noted 5 lb wt loss since discharge and BP was 126/68 prior to his AM meds. He was felt to be a little on the dry side and his torsemide was changed  from daily to PRN. BP was too soft to titrate other HF meds.   He presents back to clinic today for f/u. Here w/ wife. Feels  much better. Wt stable. He has only had to take his PRN torsemide once, after eating a meal from [place]. Denies exertional/resting dyspnea. No orthopnea/PND or LEE. He has noticed increased "flutters" since reducing amiodarone down to once daily but no sustained palpitations. Has been compliant w/ his LifeVest. No shocks. No syncope/ near syncope. He completed home sleep study and mailed in kit yesterday. Results pending.      Cardiac Studies  2D echo 05/2019 Left ventricular ejection fraction, by estimation, is 20 to 25%. The left ventricle has severely decreased function. The left ventricle demonstrates global hypokinesis. The left ventricular internal cavity size was moderately dilated. Left ventricular diastolic parameters are indeterminate. 2. Right ventricular systolic function is mildly reduced. The right ventricular size is normal. There is normal pulmonary artery systolic pressure. The estimated right ventricular systolic pressure is 04.5 mmHg. 3. Left atrial size was moderately dilated. 4. The mitral valve is normal in structure and function. Mild mitral valve regurgitation. No evidence of mitral stenosis. 5. The aortic valve is tricuspid. Aortic valve regurgitation is not visualized. No aortic stenosis is present. 6. The inferior vena cava is normal in size with greater than 50% respiratory variability, suggesting right atrial pressure of 3 mmHg.   [facility] 05/26/19 Ost LAD to Prox LAD lesion is 20% stenosed.  Findings:  Ao = 100/72 (86) LV = 103/14 RA = 2 RV = 27/4 PA = 32/12 (22) PCW = 14 Fick cardiac output/index = 6.5/3.3 PVR = 1.2 WU FA sat =  99% PA sat = 75%, 76%  Assessment: 1. Minimal CAD  2. Severe NICM EF 15% 3. Well-compensated hemodynamics  cMRi 05/26/19 IMPRESSION: 1. Severely dilated LV with EF 18%, diffuse hypokinesis.  2. Moderately dilated RV with EF 28%.  3. Nonspecific inferoseptal RV insertion site LGE, can be suggestive of  pressure overload/CHF.  4. ECV percentage elevated at 39%, can see with increased fibrosis with cardiomyopathy or myocarditis. Generally amyloidosis tends to be >40%.    Review of Systems: [y] = yes, [ ]  = no   General: Weight gain [ ] ; Weight loss [ ] ; Anorexia [ ] ; Fatigue [ ] ; Fever [ ] ; Chills [ ] ; Weakness [ ]   Cardiac: Chest pain/pressure [ ] ; Resting SOB [ ] ; Exertional SOB [ ] ; Orthopnea [ ] ; Pedal Edema [ ] ; Palpitations [ ] ; Syncope [ ] ; Presyncope [ ] ; Paroxysmal nocturnal dyspnea[ ]   Pulmonary: Cough [ ] ; Wheezing[ ] ; Hemoptysis[ ] ; Sputum [ ] ; Snoring [ ]   GI: Vomiting[ ] ; Dysphagia[ ] ; Melena[ ] ; Hematochezia [ ] ; Heartburn[ ] ; Abdominal pain [ ] ; Constipation [ ] ; Diarrhea [ ] ; BRBPR [ ]   GU: Hematuria[ ] ; Dysuria [ ] ; Nocturia[ ]   Vascular: Pain in legs with walking [ ] ; Pain in feet with lying flat [ ] ; Non-healing sores [ ] ; Stroke [ ] ; TIA [ ] ; Slurred speech [ ] ;  Neuro: Headaches[ ] ; Vertigo[ ] ; Seizures[ ] ; Paresthesias[ ] ;Blurred vision [ ] ; Diplopia [ ] ; Vision changes [ ]   Ortho/Skin: Arthritis [ ] ; Joint pain [ ] ; Muscle pain [ ] ; Joint swelling [ ] ; Back Pain [ ] ; Rash [ ]   Psych: Depression[ ] ; Anxiety[ ]   Heme: Bleeding problems [ ] ; Clotting disorders [ ] ; Anemia [ ]   Endocrine: Diabetes [ ] ; Thyroid dysfunction[ ]    Past Medical History:  Diagnosis Date  . Diabetes mellitus without complication ([place])   . Hypertension     Current Outpatient Medications  Medication Sig Dispense Refill  . albuterol (ACCUNEB) 0.63 MG/3ML nebulizer solution Take 1 ampule by nebulization every 4 (four) hours as needed for shortness of breath.     [name] albuterol (VENTOLIN HFA) 108 (90 Base) MCG/ACT inhaler Inhale 2 puffs into the lungs every 4 (four) hours as needed for wheezing.    [name] amiodarone (PACERONE) 200 MG tablet Take 1 tablet (200 mg total) by mouth daily. 60 tablet 0  . aspirin EC 81 MG EC tablet Take 1 tablet (81 mg total) by mouth daily. 90 tablet 0  . atorvastatin  (LIPITOR) 40 MG tablet Take 40 mg by mouth daily at 6 PM.     . digoxin (LANOXIN) 0.125 MG tablet Take 1 tablet (0.125 mg total) by mouth daily. 30 tablet 1  . latanoprost (XALATAN) 0.005 % ophthalmic solution Place 1 drop into both eyes at bedtime.    [name] losartan (COZAAR) 25 MG tablet Take 0.5 tablets (12.5 mg total) by mouth in the morning and at bedtime. 30 tablet 1  . metFORMIN (GLUCOPHAGE) 500 MG tablet Take 500 mg by mouth daily with breakfast.    . montelukast (SINGULAIR) 10 MG tablet Take 10 mg by mouth daily.    [name] omeprazole (PRILOSEC) 40 MG capsule Take 40 mg by mouth daily.    [name] spironolactone (ALDACTONE) 25 MG tablet Take 25 mg by mouth daily.     [name] tiZANidine (ZANAFLEX) 2 MG tablet Take 2 mg by mouth.     . torsemide (DEMADEX) 20 MG tablet Take 1 tablet (20 mg total) by mouth daily  as needed (for weight gain). 30 tablet 1  . traMADol (ULTRAM) 50 MG tablet Take 100 mg by mouth every 6 (six) hours.     No current facility-administered medications for this encounter.    Allergies  Allergen Reactions  . Codeine Nausea Only    "makes me feel weird"  . Penicillins Other (See Comments)    States a long time ago, does not recall reaction       Social History   Socioeconomic History  . Marital status: Married    Spouse name: Not on file  . Number of children: Not on file  . Years of education: Not on file  . Highest education level: Not on file  Occupational History  . Not on file  Tobacco Use  . Smoking status: Former Smoker    Types: Cigarettes    Quit date: 04/07/1989    Years since quitting: 30.2  . Smokeless tobacco: Never Used  Substance and Sexual Activity  . Alcohol use: Not Currently  . Drug use: Not Currently  . Sexual activity: Yes  Other Topics Concern  . Not on file  Social History Narrative  . Not on file   Social Determinants of Health   Financial Resource Strain:   . Difficulty of Paying Living Expenses:   Food Insecurity:   . Worried About  Charity fundraiser in the Last Year:   . Arboriculturist in the Last Year:   Transportation Needs:   . Film/video editor (Medical):   [name] Lack of Transportation (Non-Medical):   Physical Activity:   . Days of Exercise per Week:   . Minutes of Exercise per Session:   Stress:   . Feeling of Stress :   Social Connections:   . Frequency of Communication with Friends and Family:   . Frequency of Social Gatherings with Friends and Family:   . Attends Religious Services:   . Active Member of Clubs or Organizations:   . Attends Archivist Meetings:   [name] Marital Status:   Intimate Partner Violence:   . Fear of Current or Ex-Partner:   . Emotionally Abused:   [name] Physically Abused:   . Sexually Abused:       Family History  Problem Relation Age of Onset  . Heart disease Father        cardiac arrest in his 9's - surviced this and had PPM placed.   [name] Heart attack Paternal Grandfather   . CAD Paternal Uncle        CABG    Vitals:   06/21/19 1102  BP: (!) 142/96  Pulse: 74  SpO2: 98%  Weight: 75.2 kg (165 lb 12.8 oz)   PHYSICAL EXAM: General:  Well appearing. No respiratory difficulty HEENT: normal Neck: supple. no JVD. Carotids 2+ bilat; no bruits. No lymphadenopathy or thyromegaly appreciated. Cor: PMI nondisplaced. Regular rate & rhythm. No rubs, gallops or murmurs. Wearing Lifevest  Lungs: clear Abdomen: soft, nontender, nondistended. No hepatosplenomegaly. No bruits or masses. Good bowel sounds. Extremities: no cyanosis, clubbing, rash, edema Neuro: alert & oriented x 3, cranial nerves grossly intact. moves all 4 extremities w/o difficulty. Affect pleasant.    ECG: not performed today   ASSESSMENT & PLAN:  1.Chronic Systolic Heart Failure (NICM) -Echo at [facility] 05/2019 showed LVEF of 25 to 30% with severe global hypokinesis. RV also noted to be moderately enlarged with moderately impaired RV systolic function. -LHC 2/18 w/ mild, nonobstructive CAD. RHC  w/ compensated  hemodynamics  - cMRi w/ nonspecific inferoseptal RV insertion site LGE. ECV percentage elevated at 39%, can see with increased fibrosis with cardiomyopathy or myocarditis.  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   - Continue amiodarone 200 mg once daily  - Continue w/ LifeVest for primary prevention until repeat echo  - Will add back Coreg today, 6.25 mg bid - Continue losartan 12.5 mg bid. Will try to transition to [facility] at next visit if BP stable.  - Continuedigoxin 0.125 mg daily. Check dig level ok 3/1 - Continue spironolactone 25 mg daily.  - Euvolemic on exam. NYHA II. Continue PRN torsemide.  - Will need repeat echo in 3 months (May)   2. PVCs - high burden ~8-10% 2/21 - Mg, K and TSH ok  - Continue PO Amiodarone for suppression, 200 mg qd (will need f/u HFTs and TFTs again in 3 months) - Continue w/ LifeVest   - Start Coreg 6.25 mg bid today  - Sleep study completed. Interpretation pending.   3.Hypertension - Mildly elevated today, 142/96  - Will add Coreg 6.25 mg bid given systolic HF, PVCs and recent palpitations - Continue additional meds outlined above  - Check BMP today   4.Hyperlipidemia - Lipid panel from [place]: Total Cholesterol 120, Triglycerides 75, HDL 61, LDL 44.  - LHC showed mild nonobstructive CAD  - Continue Lipitor 86m qhs. General Cardiology to continue to follow   5.Type 2 Diabetes Mellitus -Hgb A1C 7.9  - Continue Metformin  - Consider addition of SGLT2i in future   F/u again in 2-3 weeks w/ pharmD for further med titration. [facility]   BLyda Jester PA-C 06/21/19

## 2019-06-21 NOTE — Addendum Note (Signed)
Encounter addended by: Kerry Dory, CMA on: 06/21/2019 3:01 PM  Actions taken: Charge Capture section accepted

## 2019-06-23 NOTE — Progress Notes (Signed)
Referring Physician: PCP: Patient, No Pcp Per PCP-Cardiologist: Dr. Althea Grimmer, MD   HPI:  69 y/o male with HTN, HL, DM2, former tobacco and ETOH use (quit 30 years ago).   Admitted in 12/20 with severe hyponatremia. Since that time, he developed progressive weakness, exertional dyspnea, LE edema and bendopnea. He denied CP but had abdominal pain with exertion. Subsequently, he was admitted to [facility] 05/2019 and found to have new systolic HF.  Echo with EF 25%. Developed NSVT with polymorphic ectopy. ECG with RBBB and new lateral TWI. He was transferred to [facility] for [facility] and AHF consultation.   He was diagnosed w/ a NICM. LHC w/ mild, nonobstructive CAD. Well compensated hemodynamics on RHC. cMRi w/ nonspecific inferoseptal RV insertion site LGE. ECV percentage elevated at 39% (can see with increased fibrosis with cardiomyopathy or myocarditis. Generally amyloidosis tends to be >40%.). 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 K, Mg and TSH WNL. 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 He was started on amiodarone for suppression of PVCs and fitted w/ a LifeVest prior to discharge. He was also discharged home on GDMT w/ losartan, spironolactone and digoxin. BP was too soft during hospitalization for Entresto. He was also discharged home on torsemide. Recommendations were to arrange for sleep study w/ r/o OSA given frequent PVCs.   He had initial post hospital f/u on 06/06/19. He reported feeling weak/fatigue and continued with mild exertional dyspnea. No dyspnea at rest. No orthopnea/PND. He had noted 5 lb wt loss since discharge and BP was 126/68 prior to his AM meds. He was felt to be a little on the dry side and his torsemide was changed  from daily to PRN. BP was too soft to titrate other HF meds.   Recently presented to HF Clinic for follow up on 06/21/19. Here w/ wife. Felt much better. Weight stable. He had only had taken his PRN torsemide once, after  eating a meal from [place]. Denied exertional/resting dyspnea. No orthopnea/PND or LEE. He had noticed increased "flutters" since reducing amiodarone down to once daily but no sustained palpitations. Had been compliant w/ his LifeVest. No shocks. No syncope/ near syncope. He completed home sleep study and mailed in kit yesterday. Results pending.   Today he returns to HF clinic for pharmacist medication titration. Here with his wife. At last visit with PA-C, carvedilol 6.25 mg BID was initiated. However, he did not make this change because they did not realize it was to treat his heart failure and not just to lower BP. They were concerned his BP was on the lower side at home (SBP 110-120), BP was 142/96 in clinic today. I suggested he bring in his current blood pressure cuff to his next visit so we can make sure it is calibrated correctly. Overall he is doing well today. No dizziness, lightheadedness or chest pain. Still having palpitations ("fluttering") multiple times per week. When these occur, they tend to last 12-24 hours. His exercise tolerance is improving but not back to his normal. He can "walk around the block" without stopping but gets SOB if walks quickly or when going up stairs. His weight has been stable at home at 159-164 lbs. He has only needed PRN torsemide once since decreased to PRN only. No LEE, PND or orthopnea. His appetite is ok. As noted above, not taking carvedilol. Additionally, he may have only been taking spironolactone 12.5 mg daily instead of 25 mg, but he is not  sure. Taking all other medications as prescribed. Tolerating all medications.   HF Medications: Carvedilol 6.25 mg BID - NOT taking Losartan 12.5 mg BID Spironolactone 25 mg daily Digoxin 0.125 mg daily Torsemide 20 mg PRN  Has the patient been experiencing any side effects to the medications prescribed?  no  Does the patient have any problems obtaining medications due to transportation or finances?   No - has  [employer]  Understanding of regimen: fair - provided extensive medication education today. Provided him with "Living with Heart Failure" book.  Understanding of indications: fair Potential of compliance: good Patient understands to avoid NSAIDs. Patient understands to avoid decongestants.    Pertinent Lab Values (06/21/19): [name] Serum creatinine 1.12, BUN 16, Potassium 4.7, Sodium 136, Digoxin 0.6 ng/mL (06/06/19)   Vital Signs: . Weight: 167.0 lbs (last clinic weight: 165.8 lbs) . Blood pressure: 142/96  . Heart rate: 83   Assessment: 1.Chronic Systolic Heart Failure (NICM) -Echo at [facility] 05/2019 showed LVEF of 25 to 30% with severe global hypokinesis. RV also noted to be moderately enlarged with moderately impaired RV systolic function. -LHC 2/18 w/ mild, nonobstructive CAD. RHC w/ compensated hemodynamics  - cMRi w/ nonspecific inferoseptal RV insertion site LGE. ECV percentage elevated at 39%, can see with increased fibrosis with cardiomyopathy or myocarditis.  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  - Euvolemic on exam. NYHA II. Continue PRN torsemide.  - Start carvedilol 6.25 mg BID. He never started this after his last visit.  -Continue losartan12.5 mg BID. Will try to transition to [facility] at next visit if BP stable. - Increase spironolactone25 mg daily (thinks he might have only been taking 12.5 mg daily but is unsure).  Will repeat BMET next visit.  - Continuedigoxin 0.125 mg daily. Digoxin level 0.6 ng/mL on 06/06/19. - Will need repeat echo in 3 months (May)  - Continue w/ LifeVest for primary prevention until repeat echo   2. PVCs - high burden ~8-10% 05/2019 - Mg, K and TSH ok  -Continue PO Amiodarone for suppression, 200 mg daily (will need f/u HFTs and TFTs again in 3 months) - Continue w/ LifeVest  - Start carvedilol 6.25 mg bid  3.Hypertension - BP elevated today, 142/96  - Will start Carvedilol  6.25 mg bid given systolic HF, PVCs and recent palpitations as above.  - Continue additional meds outlined above  - Check BMP today   4.Hyperlipidemia - Lipid panel from [place]: Total Cholesterol 120, Triglycerides 75, HDL 61, LDL 44.  - LHC showed mild nonobstructive CAD  - Continue atorvastatin 57m daily  5.Type 2 Diabetes Mellitus -Hgb A1C 7.9% (05/26/2019) - Continue Metformin 500 mg daily - Consider addition of SGLT2i in future. FYI Farxiga copay $47.00.     Plan: 1) Medication changes: Based on clinical presentation, vital signs and recent labs will start carvedilol 6.25 mg twice daily and increase spironolactone to 25 mg daily. Will plan on changing losartan to Entresto at next visit if BP is stable.  2) Follow-up: 2 weeks with Pharmacy Clinic and 6 weeks with Dr. BHaroldine Laws    LAudry Riles PharmD, BCPS, BCCP, CPP Heart Failure Clinic Pharmacist [phone]

## 2019-06-30 ENCOUNTER — Ambulatory Visit: Payer: Medicare HMO

## 2019-06-30 NOTE — Procedures (Signed)
   Sleep Study Report   Patient Information Name: Roy Duke  ID: [id number] Birth Date: 29-Sep-1950  Age: 69  Gender: Male BMI: 23.1 (W=165 lb, H=5' 11'') Study Date:06/19/2019 Referring Physician:  [name], MD  TEST DESCRIPTION: Home sleep apnea testing was completed using the WatchPat, a Type 1 device, utilizing peripheral arterial tonometry (PAT), chest movement, actigraphy, pulse oximetry, pulse rate, body position and snore. AHI was calculated with apnea and hypopnea using valid sleep time as the denominator. RDI includes apneas,hypopneas, and RERAs. The data acquired and the scoring of sleep and all associated events were performed in accordance with the recommended standards and specifications as outlined in the AASM Manual for the Scoring of Sleep and Associated Events 2.2.0 (2015).  FINDINGS: 1. No evidence of sleep disordered breathing. AHI 2.3/hr. 2. Lowest Oxygen saturation was 91%. 3. Mild snoring noted. 4. Total sleep time was 6hrs and 60min. 5. 55% of total sleep time was spent in the supine position. 6. Prolonged sleep latency onset at 61 min. 7. Normal REM sleep onset latency at 108 min. 8. There were a total of 5 awakenings.  DIAGNOSIS: Normal study with no significant sleep disordered breathing.  RECOMMENDATIONS:  1. Normal study with no significant sleep disordered breathing.  [name] Healthy sleep recommendations include: adequate nightly sleep (normal 7-9 hrs/night), avoidance of caffeine after noon and alcohol near bedtime, and maintaining a sleep environment that is cool, dark and quiet.   3. Weight loss for overweight patients is recommended.   4. Snoring recommendations include: weight loss where appropriate, side sleeping, and avoidance of alcohol before bed.  5. Operation of motor vehicle or dangerous equipment must be avoided when feeling drowsy, excessively sleepy, or mentally fatigued.  Report prepared by: Signature: Electronically  Signed: Fransico Him, MD Tomasa Hose Jun 30, 2019

## 2019-07-01 ENCOUNTER — Telehealth: Payer: Self-pay | Admitting: *Deleted

## 2019-07-01 NOTE — Telephone Encounter (Signed)
-----   Message from Sueanne Margarita, MD sent at 06/30/2019  8:53 PM EDT ----- Please let patient know that sleep study showed no significant sleep apnea.

## 2019-07-01 NOTE — Telephone Encounter (Signed)
Informed patient of sleep study results and patient understanding was verbalized. Patient understands his sleep study showed no significant sleep apnea.   Pt is aware and agreeable to normal results. 

## 2019-07-07 ENCOUNTER — Ambulatory Visit (HOSPITAL_COMMUNITY)
Admission: RE | Admit: 2019-07-07 | Discharge: 2019-07-07 | Disposition: A | Discharge status: Home / Self Care | Payer: Medicare HMO | Source: Ambulatory Visit | Attending: Internal Medicine | Admitting: Internal Medicine

## 2019-07-07 ENCOUNTER — Other Ambulatory Visit: Payer: Self-pay

## 2019-07-07 VITALS — BP 142/96 | HR 83 | Wt 167.0 lb

## 2019-07-07 DIAGNOSIS — I2510 Atherosclerotic heart disease of native coronary artery without angina pectoris: Secondary | ICD-10-CM | POA: Insufficient documentation

## 2019-07-07 DIAGNOSIS — I493 Ventricular premature depolarization: Secondary | ICD-10-CM | POA: Diagnosis not present

## 2019-07-07 DIAGNOSIS — E785 Hyperlipidemia, unspecified: Secondary | ICD-10-CM | POA: Diagnosis not present

## 2019-07-07 DIAGNOSIS — I509 Heart failure, unspecified: Secondary | ICD-10-CM | POA: Diagnosis present

## 2019-07-07 DIAGNOSIS — Z87891 Personal history of nicotine dependence: Secondary | ICD-10-CM | POA: Insufficient documentation

## 2019-07-07 DIAGNOSIS — Z79899 Other long term (current) drug therapy: Secondary | ICD-10-CM | POA: Insufficient documentation

## 2019-07-07 DIAGNOSIS — I428 Other cardiomyopathies: Secondary | ICD-10-CM | POA: Insufficient documentation

## 2019-07-07 DIAGNOSIS — Z7984 Long term (current) use of oral hypoglycemic drugs: Secondary | ICD-10-CM | POA: Insufficient documentation

## 2019-07-07 DIAGNOSIS — E119 Type 2 diabetes mellitus without complications: Secondary | ICD-10-CM | POA: Insufficient documentation

## 2019-07-07 DIAGNOSIS — I110 Hypertensive heart disease with heart failure: Secondary | ICD-10-CM | POA: Insufficient documentation

## 2019-07-07 DIAGNOSIS — I5022 Chronic systolic (congestive) heart failure: Secondary | ICD-10-CM | POA: Insufficient documentation

## 2019-07-07 NOTE — Patient Instructions (Addendum)
It was a pleasure seeing you today!  MEDICATIONS: -We are changing your medications today -Start carvedilol to 6.25 mg (1 tablet) twice daily -Take spironolactone 25 mg (1 tablet) daily -Please bring in your blood pressure cuff to visit.  -Call if you have questions about your medications.   NEXT APPOINTMENT: Return to clinic in 2 weeks with Pharmacy Clinic.   In general, to take care of your heart failure: -Limit your fluid intake to 2 Liters (half-gallon) per day.   -Limit your salt intake to ideally 2-3 grams (2000-3000 mg) per day. -Weigh yourself daily and record, and bring that "weight diary" to your next appointment.  (Weight gain of 2-3 pounds in 1 day typically means fluid weight.) -The medications for your heart are to help your heart and help you live longer.   -Please contact us before stopping any of your heart medications.  Call the clinic at [phone] with questions or to reschedule future appointments.

## 2019-07-08 NOTE — Progress Notes (Signed)
Referring Physician: PCP: Patient, No Pcp Per PCP-Cardiologist: Dr. Althea Grimmer, MD   HPI:  69 y/o male with HTN, HL, DM2, former tobacco and ETOH use (quit 30 years ago).   Admitted in 12/20 with severe hyponatremia. Since that time, he developed progressive weakness, exertional dyspnea, LE edema and bendopnea. He denied CP but had abdominal pain with exertion. Subsequently, he was admitted to [facility] 05/2019 and found to have new systolic HF.  Echo with EF 25%. Developed NSVT with polymorphic ectopy. ECG with RBBB and new lateral TWI. He was transferred to [facility] for [facility] and AHF consultation.   He was diagnosed w/ a NICM. LHC w/ mild, nonobstructive CAD. Well compensated hemodynamics on RHC. cMRi w/ nonspecific inferoseptal RV insertion site LGE. ECV percentage elevated at 39% (can see with increased fibrosis with cardiomyopathy or myocarditis. Generally amyloidosis tends to be >40%.). 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 K, Mg and TSH WNL. 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 He was started on amiodarone for suppression of PVCs and fitted w/ a LifeVest prior to discharge. He was also discharged home on GDMT w/ losartan, spironolactone and digoxin. BP was too soft during hospitalization for Entresto. He was also discharged home on torsemide. Recommendations were to arrange for sleep study w/ r/o OSA given frequent PVCs.   He had initial post hospital f/u on 06/06/19. He reported feeling weak/fatigue and continued with mild exertional dyspnea. No dyspnea at rest. No orthopnea/PND. He had noted 5 lb wt loss since discharge and BP was 126/68 prior to his AM meds. He was felt to be a little on the dry side and his torsemide was changed  from daily to PRN. BP was too soft to titrate other HF meds.   Recently presented to HF Clinic for follow up on 06/21/19 with PA-C. Here w/ wife. Felt much better. Weight stable. He had only had taken his PRN torsemide  once, after eating a meal from [place]. Denied exertional/resting dyspnea. No orthopnea/PND or LEE. He had noticed increased "flutters" since reducing amiodarone down to once daily but no sustained palpitations. Had been compliant w/ his LifeVest. No shocks. No syncope/ near syncope. He completed home sleep study and mailed in kit yesterday. Results pending.   Recently returned to HF clinic for pharmacist medication titration on 07/07/19. Here with his wife. At previous visit with PA-C, carvedilol 6.25 mg BID was initiated. However, he did not make this change because he did not realize it was to treat his heart failure and not just to lower BP. They were concerned his BP was on the lower side at home (SBP 110-120), BP was 142/96 in clinic. I suggested he bring in his current blood pressure cuff to his next visit so we can make sure it is calibrated correctly. Overall he was doing well today. No dizziness, lightheadedness or chest pain. Still having palpitations ("fluttering") multiple times per week. When these occur, they tend to last 12-24 hours. His exercise tolerance was improving but not back to his normal. He could "walk around the block" without stopping but reported getting SOB if walks quickly or when going up stairs. His weight had been stable at home at 159-164 lbs. He had only needed PRN torsemide once since decreased to PRN only. No LEE, PND or orthopnea. His appetite was ok. As noted above, not taking carvedilol. Additionally, he may have only been taking spironolactone 12.5 mg daily instead of 25 mg, but  he was not sure. Taking all other medications as prescribed. Tolerating all medications.   Today he returns to HF clinic for pharmacist medication titration. At last visit with pharmacy clinic, carvedilol 6.25 mg BID was started and spironolactone was increased to 25 mg daily. Overall he is feeling well today. No dizziness/ lightheadedness.  Still complains of "fluttering" in his chest,  which he notices more often when he is laying down. No SOB/DOE but does note he has to walk much slower than he is used to. He weighs himself daily at home. His weight has been stable at 159-161 lbs at home. He has not needed any PRN torsemide since last visit. No LEE, PND or orthopnea. Taking all medications as prescribed. Tolerating all medications.   HF Medications: Carvedilol 6.25 mg BID Losartan 12.5 mg BID Spironolactone 25 mg daily Digoxin 0.125 mg daily Torsemide 20 mg PRN  Has the patient been experiencing any side effects to the medications prescribed?  no  Does the patient have any problems obtaining medications due to transportation or finances?   No - has [employer].   Understanding of regimen: fair - provided extensive medication education today. Provided him with "Living with Heart Failure" book last visit.  Understanding of indications: fair Potential of compliance: good Patient understands to avoid NSAIDs. Patient understands to avoid decongestants.    Pertinent Lab Values:  . Serum creatinine 1.08, BUN 15, Potassium 5.3, Sodium 135, Digoxin 0.6 ng/mL (06/06/19)   Vital Signs: . Weight: 168 lbs (last clinic weight: 167 lbs) . Blood pressure: 142/96  . Heart rate: 71   Assessment: 1.Chronic Systolic Heart Failure (NICM) -Echo at [facility] 05/2019 showed LVEF of 25 to 30% with severe global hypokinesis. RV also noted to be moderately enlarged with moderately impaired RV systolic function. -LHC 2/18 w/ mild, nonobstructive CAD. RHC w/ compensated hemodynamics  - cMRi w/ nonspecific inferoseptal RV insertion site LGE. ECV percentage elevated at 39%, can see with increased fibrosis with cardiomyopathy or myocarditis.  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  - Euvolemic on exam. NYHA II. Continue PRN torsemide.  - Vitals: BP 146/78, HR 71 - Lab: Scr 1.08, K elevated at 5.3. Labs returned after patient visit complete. I  called patient and discussed following a low potassium diet and I will have him decrease spironolactone (see below). Repeat BMET in 5 days.  - Continue carvedilol 6.25 mg BID.  -Stop losartan and start Entresto 24/26 mg BID. Repeat BMET in 5 days.  - Decrease spironolactone to12.5 mg daily. Repeat BMET in 5 days  - Continuedigoxin 0.125 mg daily. Digoxin level 0.6 ng/mL on 06/06/19. - Will need repeat echo in 3 months (May)  - Continue w/ LifeVest for primary prevention until repeat echo   2. PVCs - high burden ~8-10% 05/2019 - Mg, K and TSH ok  -Continue PO Amiodarone for suppression, 200 mg daily (will need f/u HFTs and TFTs again in 3 months) - Continue w/ LifeVest  - Continue carvedilol 6.25 mg bid  3.Hypertension - BP elevated today, 146/78 - Continue Carvedilol 6.25 mg bid given systolic HF, PVCs and recent palpitations as above.  - Continue additional meds outlined above   4.Hyperlipidemia - Lipid panel from [place]: Total Cholesterol 120, Triglycerides 75, HDL 61, LDL 44.  - LHC showed mild nonobstructive CAD  - Continue atorvastatin 76m daily  5.Type 2 Diabetes Mellitus -Hgb A1C 7.9% (05/26/2019) - Continue Metformin 500 mg daily - Consider addition of SGLT2i in  future. FYI Farxiga copay $47.00.     Plan: 1) Medication changes: Based on clinical presentation, vital signs and recent labs will Stop losartan and start Entresto 24/26 mg BID. Will also decrease spironolactone to 12.5 mg daily and follow low potassium diet due to elevated potassium. Repeat BMET in 5 days.  2) Follow-up: BMET in 5 days, HF Clinic with Dr. Haroldine Laws in 1 month   Audry Riles, PharmD, BCPS, BCCP, [facility] Pharmacist [phone]

## 2019-07-11 ENCOUNTER — Other Ambulatory Visit: Payer: Self-pay | Admitting: Cardiology

## 2019-07-13 ENCOUNTER — Telehealth (HOSPITAL_COMMUNITY): Payer: Self-pay | Admitting: Pharmacist

## 2019-07-13 MED ORDER — AMIODARONE HCL 200 MG PO TABS: 200.0000 mg | ORAL_TABLET | Freq: Every day | ORAL | 11 refills | Status: DC

## 2019-07-13 MED ORDER — SPIRONOLACTONE 25 MG PO TABS: 25.0000 mg | ORAL_TABLET | Freq: Every day | ORAL | 11 refills | Status: DC

## 2019-07-13 NOTE — Telephone Encounter (Signed)
Sent refills of spironolactone and amiodarone to [place] per patient request.   Audry Riles, PharmD, BCPS, BCCP, CPP Heart Failure Clinic Pharmacist [phone]

## 2019-07-18 ENCOUNTER — Other Ambulatory Visit (HOSPITAL_COMMUNITY): Payer: Self-pay

## 2019-07-18 MED ORDER — DIGOXIN 125 MCG PO TABS: 0.1250 mg | ORAL_TABLET | Freq: Every day | ORAL | 3 refills | Status: DC

## 2019-07-21 ENCOUNTER — Ambulatory Visit (HOSPITAL_COMMUNITY)
Admission: RE | Admit: 2019-07-21 | Discharge: 2019-07-21 | Disposition: A | Discharge status: Home / Self Care | Payer: Medicare HMO | Source: Ambulatory Visit | Attending: Cardiology | Admitting: Cardiology

## 2019-07-21 ENCOUNTER — Other Ambulatory Visit: Payer: Self-pay

## 2019-07-21 VITALS — BP 146/78 | HR 71 | Wt 168.0 lb

## 2019-07-21 DIAGNOSIS — E785 Hyperlipidemia, unspecified: Secondary | ICD-10-CM | POA: Insufficient documentation

## 2019-07-21 DIAGNOSIS — I110 Hypertensive heart disease with heart failure: Secondary | ICD-10-CM | POA: Diagnosis not present

## 2019-07-21 DIAGNOSIS — I4510 Unspecified right bundle-branch block: Secondary | ICD-10-CM | POA: Diagnosis not present

## 2019-07-21 DIAGNOSIS — I493 Ventricular premature depolarization: Secondary | ICD-10-CM | POA: Diagnosis not present

## 2019-07-21 DIAGNOSIS — Z87891 Personal history of nicotine dependence: Secondary | ICD-10-CM | POA: Insufficient documentation

## 2019-07-21 DIAGNOSIS — I5022 Chronic systolic (congestive) heart failure: Secondary | ICD-10-CM | POA: Insufficient documentation

## 2019-07-21 DIAGNOSIS — I428 Other cardiomyopathies: Secondary | ICD-10-CM | POA: Diagnosis not present

## 2019-07-21 DIAGNOSIS — Z7984 Long term (current) use of oral hypoglycemic drugs: Secondary | ICD-10-CM | POA: Diagnosis not present

## 2019-07-21 DIAGNOSIS — E119 Type 2 diabetes mellitus without complications: Secondary | ICD-10-CM | POA: Insufficient documentation

## 2019-07-21 DIAGNOSIS — Z79899 Other long term (current) drug therapy: Secondary | ICD-10-CM | POA: Diagnosis not present

## 2019-07-21 LAB — BASIC METABOLIC PANEL
Anion gap: 9 (ref 5–15)
BUN: 15 mg/dL (ref 8–23)
CO2: 24 mmol/L (ref 22–32)
Calcium: 9.2 mg/dL (ref 8.9–10.3)
Chloride: 102 mmol/L (ref 98–111)
Creatinine, Ser: 1.08 mg/dL (ref 0.61–1.24)
GFR calc Af Amer: >60 mL/min (ref >60)
GFR calc non Af Amer: >60 mL/min (ref >60)
Glucose, Bld: 131 mg/dL — ABNORMAL HIGH (ref 70–99)
Potassium: 5.3 mmol/L — ABNORMAL HIGH (ref 3.5–5.1)
Sodium: 135 mmol/L (ref 135–145)

## 2019-07-21 MED ORDER — TORSEMIDE 20 MG PO TABS: 20.0000 mg | ORAL_TABLET | Freq: Every day | ORAL | 1 refills | Status: DC | PRN

## 2019-07-21 MED ORDER — SACUBITRIL-VALSARTAN 24-26 MG PO TABS: 1.0000 | ORAL_TABLET | Freq: Two times a day (BID) | ORAL | 11 refills | Status: DC

## 2019-07-21 MED ORDER — SPIRONOLACTONE 25 MG PO TABS: 12.5000 mg | ORAL_TABLET | Freq: Every day | ORAL | 11 refills | Status: DC

## 2019-07-21 NOTE — Patient Instructions (Signed)
It was a pleasure seeing you today!  MEDICATIONS: -We are changing your medications today -Stop Losartan -Start Entresto 24/26 mg (1 tablet) twice daily -Call if you have questions about your medications.  LABS: -We will call you if your labs need attention.  NEXT APPOINTMENT: Return to clinic in 1 month with Dr. Haroldine Laws.  In general, to take care of your heart failure: -Limit your fluid intake to 2 Liters (half-gallon) per day.   -Limit your salt intake to ideally 2-3 grams (2000-3000 mg) per day. -Weigh yourself daily and record, and bring that "weight diary" to your next appointment.  (Weight gain of 2-3 pounds in 1 day typically means fluid weight.) -The medications for your heart are to help your heart and help you live longer.   -Please contact us before stopping any of your heart medications.  Call the clinic at [phone] with questions or to reschedule future appointments.

## 2019-07-25 ENCOUNTER — Other Ambulatory Visit (HOSPITAL_COMMUNITY): Payer: Self-pay | Admitting: *Deleted

## 2019-07-26 ENCOUNTER — Other Ambulatory Visit: Payer: Self-pay

## 2019-07-26 ENCOUNTER — Ambulatory Visit (HOSPITAL_COMMUNITY)
Admission: RE | Admit: 2019-07-26 | Discharge: 2019-07-26 | Disposition: A | Discharge status: Home / Self Care | Payer: Medicare HMO | Source: Ambulatory Visit | Attending: Cardiology | Admitting: Cardiology

## 2019-07-26 ENCOUNTER — Other Ambulatory Visit (HOSPITAL_COMMUNITY): Payer: Self-pay

## 2019-07-26 DIAGNOSIS — I5022 Chronic systolic (congestive) heart failure: Secondary | ICD-10-CM | POA: Diagnosis not present

## 2019-07-26 LAB — BASIC METABOLIC PANEL
Anion gap: 7 (ref 5–15)
BUN: 14 mg/dL (ref 8–23)
CO2: 27 mmol/L (ref 22–32)
Calcium: 8.9 mg/dL (ref 8.9–10.3)
Chloride: 104 mmol/L (ref 98–111)
Creatinine, Ser: 1.01 mg/dL (ref 0.61–1.24)
GFR calc Af Amer: >60 mL/min (ref >60)
GFR calc non Af Amer: >60 mL/min (ref >60)
Glucose, Bld: 152 mg/dL — ABNORMAL HIGH (ref 70–99)
Potassium: 4.8 mmol/L (ref 3.5–5.1)
Sodium: 138 mmol/L (ref 135–145)

## 2019-08-02 ENCOUNTER — Other Ambulatory Visit (HOSPITAL_COMMUNITY): Payer: Medicare HMO

## 2019-08-23 ENCOUNTER — Other Ambulatory Visit: Payer: Self-pay

## 2019-08-23 ENCOUNTER — Encounter (HOSPITAL_COMMUNITY): Payer: Self-pay | Admitting: Internal Medicine

## 2019-08-23 ENCOUNTER — Ambulatory Visit (HOSPITAL_COMMUNITY)
Admission: RE | Admit: 2019-08-23 | Discharge: 2019-08-23 | Disposition: A | Discharge status: Home / Self Care | Payer: Medicare HMO | Source: Ambulatory Visit | Attending: Internal Medicine | Admitting: Internal Medicine

## 2019-08-23 VITALS — BP 146/90 | HR 71 | Wt 174.0 lb

## 2019-08-23 DIAGNOSIS — Z7982 Long term (current) use of aspirin: Secondary | ICD-10-CM | POA: Insufficient documentation

## 2019-08-23 DIAGNOSIS — I493 Ventricular premature depolarization: Secondary | ICD-10-CM

## 2019-08-23 DIAGNOSIS — Z8249 Family history of ischemic heart disease and other diseases of the circulatory system: Secondary | ICD-10-CM | POA: Diagnosis not present

## 2019-08-23 DIAGNOSIS — I110 Hypertensive heart disease with heart failure: Secondary | ICD-10-CM | POA: Diagnosis not present

## 2019-08-23 DIAGNOSIS — I2510 Atherosclerotic heart disease of native coronary artery without angina pectoris: Secondary | ICD-10-CM | POA: Insufficient documentation

## 2019-08-23 DIAGNOSIS — Z7901 Long term (current) use of anticoagulants: Secondary | ICD-10-CM | POA: Diagnosis not present

## 2019-08-23 DIAGNOSIS — I428 Other cardiomyopathies: Secondary | ICD-10-CM | POA: Diagnosis not present

## 2019-08-23 DIAGNOSIS — E871 Hypo-osmolality and hyponatremia: Secondary | ICD-10-CM | POA: Insufficient documentation

## 2019-08-23 DIAGNOSIS — Z87891 Personal history of nicotine dependence: Secondary | ICD-10-CM | POA: Diagnosis not present

## 2019-08-23 DIAGNOSIS — E119 Type 2 diabetes mellitus without complications: Secondary | ICD-10-CM | POA: Diagnosis not present

## 2019-08-23 DIAGNOSIS — R5383 Other fatigue: Secondary | ICD-10-CM | POA: Insufficient documentation

## 2019-08-23 DIAGNOSIS — Z79899 Other long term (current) drug therapy: Secondary | ICD-10-CM | POA: Insufficient documentation

## 2019-08-23 DIAGNOSIS — Z7984 Long term (current) use of oral hypoglycemic drugs: Secondary | ICD-10-CM | POA: Insufficient documentation

## 2019-08-23 DIAGNOSIS — I5022 Chronic systolic (congestive) heart failure: Secondary | ICD-10-CM

## 2019-08-23 DIAGNOSIS — I4510 Unspecified right bundle-branch block: Secondary | ICD-10-CM | POA: Insufficient documentation

## 2019-08-23 DIAGNOSIS — E785 Hyperlipidemia, unspecified: Secondary | ICD-10-CM | POA: Insufficient documentation

## 2019-08-23 LAB — COMPREHENSIVE METABOLIC PANEL
ALT: 34 U/L (ref 0–44)
AST: 28 U/L (ref 15–41)
Albumin: 4.2 g/dL (ref 3.5–5.0)
Alkaline Phosphatase: 51 U/L (ref 38–126)
Anion gap: 8 (ref 5–15)
BUN: 15 mg/dL (ref 8–23)
CO2: 26 mmol/L (ref 22–32)
Calcium: 9.1 mg/dL (ref 8.9–10.3)
Chloride: 104 mmol/L (ref 98–111)
Creatinine, Ser: 0.97 mg/dL (ref 0.61–1.24)
GFR calc Af Amer: >60 mL/min (ref >60)
GFR calc non Af Amer: >60 mL/min (ref >60)
Glucose, Bld: 138 mg/dL — ABNORMAL HIGH (ref 70–99)
Potassium: 5.1 mmol/L (ref 3.5–5.1)
Sodium: 138 mmol/L (ref 135–145)
Total Bilirubin: 0.5 mg/dL (ref 0.3–1.2)
Total Protein: 6.9 g/dL (ref 6.5–8.1)

## 2019-08-23 LAB — TSH: TSH: 4.586 u[IU]/mL — ABNORMAL HIGH (ref 0.350–4.500)

## 2019-08-23 LAB — BRAIN NATRIURETIC PEPTIDE: B Natriuretic Peptide: 73.2 pg/mL (ref 0.0–100.0)

## 2019-08-23 LAB — T4, FREE: Free T4: 0.67 ng/dL (ref 0.61–1.12)

## 2019-08-23 MED ORDER — ENTRESTO 49-51 MG PO TABS: 1.0000 | ORAL_TABLET | Freq: Two times a day (BID) | ORAL | 3 refills | Status: DC

## 2019-08-23 NOTE — Progress Notes (Signed)
Advanced Heart Failure Clinic Note   Referring Physician: PCP: Patient, No Pcp Per PCP-Cardiologist: Dr. Althea Grimmer, MD   Reason for Visit:  F/u for Systolic Heart Failure and PVCs   HPI:  69 y/o male with HTN, HL, DM2, former tobacco and ETOH use (quit 30 years ago).   Admitted in 12/20 with severe hyponatremia. Since that time, he developed progressive weakness, exertional dyspnea, LE edema and bendopnea. He denied CP but had ab pain with exertion. Subsequently, he was admitted to [facility] 05/2019 and found to have new systolic HF.  Echo with EF 25%. Developed NSVT with polymorphic ectopy. ECG with RBBB and new lateral TWI. He was transferred to [facility] for [facility] and AHF consultation.   He was diagnosed w/ a NICM. LHC w/ mild, nonobstructive CAD. Well compensated hemodynamics on RHC. cMRi w/ nonspecific inferoseptal RV insertion site LGE. ECV percentage elevated at 39% (can see with increased fibrosis with cardiomyopathy or myocarditis. Generally amyloidosis tends to be >40%.). 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 K, Mg and TSH WNL. 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 He was started on amiodarone for suppression of PVCs and fitted w/ a LifeVest prior to discharge. He was also discharged home on GDMT w/ losartan, spironolactone and digoxin. BP was too soft during hospitalization for Entresto. He was also discharged home on torsemide. Recommendations were to arrange for sleep study w/ r/o OSA given frequent PVCs.   He presents back to clinic today for f/u. Here w/ wife. Has been followed in [facility] and recently [facility] started. Spiro cut back to 12.5 due to hyperkalemia. Says he is feeling better but still very fatigued. Can walk around the block as long as he goes slowly. Denies edema, orthopnea or PND. No CP.  BP at home typically at home 120/70.   Sleep study 3/21 AHI 2.3   Cardiac Studies  2D echo 05/2019 Left ventricular ejection  fraction, by estimation, is 20 to 25%. The left ventricle has severely decreased function. The left ventricle demonstrates global hypokinesis. The left ventricular internal cavity size was moderately dilated. Left ventricular diastolic parameters are indeterminate. 2. Right ventricular systolic function is mildly reduced. The right ventricular size is normal. There is normal pulmonary artery systolic pressure. The estimated right ventricular systolic pressure is [id number] mmHg. 3. Left atrial size was moderately dilated. 4. The mitral valve is normal in structure and function. Mild mitral valve regurgitation. No evidence of mitral stenosis. 5. The aortic valve is tricuspid. Aortic valve regurgitation is not visualized. No aortic stenosis is present. 6. The inferior vena cava is normal in size with greater than 50% respiratory variability, suggesting right atrial pressure of 3 mmHg.   [facility] 05/26/19 Ost LAD to Prox LAD lesion is 20% stenosed.  Findings:  Ao = 100/72 (86) LV = 103/14 RA = 2 RV = 27/4 PA = 32/12 (22) PCW = 14 Fick cardiac output/index = 6.5/3.3 PVR = 1.2 WU FA sat = 99% PA sat = 75%, 76%  Assessment: 1. Minimal CAD  2. Severe NICM EF 15% 3. Well-compensated hemodynamics  cMRi 05/26/19 IMPRESSION: 1. Severely dilated LV with EF 18%, diffuse hypokinesis.  2. Moderately dilated RV with EF 28%.  3. Nonspecific inferoseptal RV insertion site LGE, can be suggestive of pressure overload/CHF.  4. ECV percentage elevated at 39%, can see with increased fibrosis with cardiomyopathy or myocarditis. Generally amyloidosis tends to be >40%.     Past Medical History:  Diagnosis Date  . Diabetes mellitus without complication ([place])   . Hypertension     Current Outpatient Medications  Medication Sig Dispense Refill  . albuterol (ACCUNEB) 0.63 MG/3ML nebulizer solution Take 1 ampule by nebulization every 4 (four) hours as needed for shortness of breath.       [name] albuterol (VENTOLIN HFA) 108 (90 Base) MCG/ACT inhaler Inhale 2 puffs into the lungs every 4 (four) hours as needed for wheezing.    [name] amiodarone (PACERONE) 200 MG tablet Take 1 tablet (200 mg total) by mouth daily. 30 tablet 11  . aspirin EC 81 MG EC tablet Take 1 tablet (81 mg total) by mouth daily. 90 tablet 0  . atorvastatin (LIPITOR) 40 MG tablet Take 40 mg by mouth daily at 6 PM.     . carvedilol (COREG) 6.25 MG tablet Take 1 tablet (6.25 mg total) by mouth 2 (two) times daily. 60 tablet 11  . digoxin (LANOXIN) 0.125 MG tablet Take 1 tablet (0.125 mg total) by mouth daily. 90 tablet 3  . latanoprost (XALATAN) 0.005 % ophthalmic solution Place 1 drop into both eyes at bedtime.    . metFORMIN (GLUCOPHAGE) 500 MG tablet Take 500 mg by mouth daily with breakfast.    . montelukast (SINGULAIR) 10 MG tablet Take 10 mg by mouth daily.    [name] omeprazole (PRILOSEC) 40 MG capsule Take 40 mg by mouth daily.    . sacubitril-valsartan (ENTRESTO) 24-26 MG Take 1 tablet by mouth 2 (two) times daily. 60 tablet 11  . spironolactone (ALDACTONE) 25 MG tablet Take 0.5 tablets (12.5 mg total) by mouth daily. 15 tablet 11  . torsemide (DEMADEX) 20 MG tablet Take 1 tablet (20 mg total) by mouth daily as needed (for weight gain). 30 tablet 1  . traMADol (ULTRAM) 50 MG tablet Take 100 mg by mouth every 6 (six) hours.     No current facility-administered medications for this encounter.    Allergies  Allergen Reactions  . Codeine Nausea Only    "makes me feel weird"  . Penicillins Other (See Comments)    States a long time ago, does not recall reaction       Social History   Socioeconomic History  . Marital status: Married    Spouse name: Not on file  . Number of children: Not on file  . Years of education: Not on file  . Highest education level: Not on file  Occupational History  . Not on file  Tobacco Use  . Smoking status: Former Smoker    Types: Cigarettes    Quit date: 04/07/1989    Years  since quitting: 30.3  . Smokeless tobacco: Never Used  Substance and Sexual Activity  . Alcohol use: Not Currently  . Drug use: Not Currently  . Sexual activity: Yes  Other Topics Concern  . Not on file  Social History Narrative  . Not on file   Social Determinants of Health   Financial Resource Strain:   . Difficulty of Paying Living Expenses:   Food Insecurity:   . Worried About Charity fundraiser in the Last Year:   . Arboriculturist in the Last Year:   Transportation Needs:   . Film/video editor (Medical):   [name] Lack of Transportation (Non-Medical):   Physical Activity:   . Days of Exercise per Week:   . Minutes of Exercise per Session:   Stress:   . Feeling of Stress :   Social Connections:   .  Frequency of Communication with Friends and Family:   . Frequency of Social Gatherings with Friends and Family:   . Attends Religious Services:   . Active Member of Clubs or Organizations:   . Attends Archivist Meetings:   [name] Marital Status:   Intimate Partner Violence:   . Fear of Current or Ex-Partner:   . Emotionally Abused:   [name] Physically Abused:   . Sexually Abused:       Family History  Problem Relation Age of Onset  . Heart disease Father        cardiac arrest in his 59's - surviced this and had PPM placed.   [name] Heart attack Paternal Grandfather   . CAD Paternal Uncle        CABG    Vitals:   08/23/19 1342  BP: (!) 146/90  Pulse: 71  SpO2: 98%  Weight: 78.9 kg (174 lb)   PHYSICAL EXAM: General:  Well appearing. No resp difficulty HEENT: normal Neck: supple. no JVD. Carotids 2+ bilat; no bruits. No lymphadenopathy or thryomegaly appreciated. Cor: PMI nondisplaced. Regular rate & rhythm. No rubs, gallops or murmurs. Lungs: clear Abdomen: soft, nontender, nondistended. No hepatosplenomegaly. No bruits or masses. Good bowel sounds. Extremities: no cyanosis, clubbing, rash, edema Neuro: alert & orientedx3, cranial nerves grossly intact. moves  all 4 extremities w/o difficulty. Affect pleasant    ASSESSMENT & PLAN:  1.Chronic Systolic Heart Failure (NICM) -Echo at [facility] 05/2019 showed LVEF of 25 to 30% with severe global hypokinesis. RV also noted to be moderately enlarged with moderately impaired RV systolic function. -LHC 2/18 w/ mild, nonobstructive CAD. RHC w/ compensated hemodynamics  - cMRi w/ nonspecific inferoseptal RV insertion site LGE. ECV percentage elevated at 39%, can see with increased fibrosis with cardiomyopathy or myocarditis.  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 PVCs now suppressed with amio - Stable NYHA II-early III. Volume status ok Continue PRN torsemide.  - Continue amiodarone 200 mg once daily  - Continue w/ LifeVest for primary prevention until repeat echo  - Continue Coreg today, 6.25 mg bid - Increase Entresto to 49/51 bid - Continuedigoxin 0.125 mg daily. Check dig level ok 3/1 - Continue spironolactone 12.5 mg daily. (decreased due to hyperkalemia) - Check labs - repeat echo. If EF < =35% will need EP referral for ICD  2. PVCs - high burden ~8-10% 2/21 - PVCs suppressed with amio - Continue w/ LifeVest   - Sleep study negative  3.Hypertension - Mildly elevated today - Increase Entresto  4.Hyperlipidemia - Lipid panel from [place]: Total Cholesterol 120, Triglycerides 75, HDL 61, LDL 44.  - LHC showed mild nonobstructive CAD  - Continue Lipitor 40mg  qhs. General Cardiology to continue to follow   5.Type 2 Diabetes Mellitus -Hgb A1C 7.9  - Continue Metformin  - Consider addition of SGLT2i in future    Glori Bickers, MD 08/23/19

## 2019-08-23 NOTE — Patient Instructions (Addendum)
Increase Entresto to 49/51 mg Twice daily   Labs done today, we will call you for abnormal results  Your physician has requested that you have an echocardiogram. Echocardiography is a painless test that uses sound waves to create images of your heart. It provides your doctor with information about the size and shape of your heart and how well your heart's chambers and valves are working. This procedure takes approximately one hour. There are no restrictions for this procedure.  Your physician recommends that you schedule a follow-up appointment in: 4 weeks with virtual visit  If you have any questions or concerns before your next appointment please send [place] a message through [place] or call our office at [phone].  At the [facility], you and your health needs are our priority. As part of our continuing mission to provide you with exceptional heart care, we have created designated Provider Care Teams. These Care Teams include your primary Cardiologist (physician) and Advanced Practice Providers (APPs- Physician Assistants and Nurse Practitioners) who all work together to provide you with the care you need, when you need it.   You may see any of the following providers on your designated Care Team at your next follow up: [name] Dr Glori Bickers . Dr [name] . Darrick Grinder, NP . Lyda Jester, PA . Audry Riles, PharmD   Please be sure to bring in all your medications bottles to every appointment.

## 2019-08-24 LAB — T3, FREE: T3, Free: 2.3 pg/mL (ref 2.0–4.4)

## 2019-08-29 ENCOUNTER — Other Ambulatory Visit: Payer: Self-pay

## 2019-08-29 ENCOUNTER — Telehealth (HOSPITAL_COMMUNITY): Payer: Self-pay | Admitting: Pharmacist

## 2019-08-29 ENCOUNTER — Ambulatory Visit (HOSPITAL_BASED_OUTPATIENT_CLINIC_OR_DEPARTMENT_OTHER)
Admission: RE | Admit: 2019-08-29 | Discharge: 2019-08-29 | Disposition: A | Discharge status: Home / Self Care | Payer: Medicare HMO | Source: Ambulatory Visit | Attending: Internal Medicine | Admitting: Internal Medicine

## 2019-08-29 DIAGNOSIS — I5022 Chronic systolic (congestive) heart failure: Secondary | ICD-10-CM | POA: Diagnosis not present

## 2019-08-29 NOTE — Telephone Encounter (Signed)
Submitted application for [employer] heart failure fund grant. Application pending.   Chanan Detwiler, PharmD, BCPS, BCCP, CPP Heart Failure Clinic Pharmacist [phone]  

## 2019-08-29 NOTE — Progress Notes (Signed)
  Echocardiogram 2D Echocardiogram has been performed.  Roy Duke 08/29/2019, 2:50 PM

## 2019-09-06 ENCOUNTER — Telehealth (HOSPITAL_COMMUNITY): Payer: Self-pay | Admitting: Pharmacy Technician

## 2019-09-06 NOTE — Telephone Encounter (Signed)
Patient's Heart Failure grant has been renewed with [employer]. The billing information remains the same. Called and shared this information with the patient and the pharmacy.  The expiration date is now 08/29/2020  Charlann Boxer, CPhT

## 2019-09-07 ENCOUNTER — Other Ambulatory Visit: Payer: Self-pay

## 2019-09-07 ENCOUNTER — Ambulatory Visit (HOSPITAL_COMMUNITY)
Admission: RE | Admit: 2019-09-07 | Discharge: 2019-09-07 | Disposition: A | Discharge status: Home / Self Care | Payer: Medicare HMO | Source: Ambulatory Visit | Attending: Internal Medicine | Admitting: Internal Medicine

## 2019-09-07 DIAGNOSIS — I10 Essential (primary) hypertension: Secondary | ICD-10-CM

## 2019-09-07 DIAGNOSIS — I5022 Chronic systolic (congestive) heart failure: Secondary | ICD-10-CM | POA: Diagnosis not present

## 2019-09-07 DIAGNOSIS — I493 Ventricular premature depolarization: Secondary | ICD-10-CM

## 2019-09-07 NOTE — Progress Notes (Signed)
Heart Failure TeleHealth Note  Due to national recommendations of social distancing due to [place] 19, Audio/video telehealth visit is felt to be most appropriate for this patient at this time.  See MyChart message from today for patient consent regarding telehealth for Roy Duke. The patient was identified personally using two identifiers.   Date:  09/07/2019   ID:  Roy Duke, DOB 02/02/1951, MRN [id number]  Location: Home  Provider location: [facility] Type of Visit: Established patient  PCP:  System, Pcp Not In  Cardiologist:  Roy Bickers, MD Primary HF: Roy Duke  Chief Complaint: Heart Failure follow-up   History of Present Illness:  Roy Duke is a 69 y/o male with HTN, HL, DM2, former tobacco, ETOH use (quit 30 years ago) and systolic HF due to NICM.  Admitted in 12/20 to [facility] with severe hyponatremia and HF symptoms. Echo with EF 25%. Developed NSVT with polymorphic ectopy. ECG with RBBB and new lateral TWI. He was transferred to [facility] for [facility] and AHF consultation.   He was diagnosed w/ a NICM. LHC w/ mild, nonobstructive CAD. Well compensated hemodynamics on RHC. cMRi EF 28% w/ nonspecific inferoseptal RV insertion site LGE. ECV percentage elevated at 39% (can see with increased fibrosis with cardiomyopathy or myocarditis. Generally amyloidosis tends to be >40%.). 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  He was started on amiodarone for suppression of PVCs and fitted w/ a LifeVest prior to discharge.   He presents via Engineer, civil (consulting) for a telehealth visit today with his wife. Says he is being much more active and feels good. Walking with his wife. Going for short walks and going to stores with his wife. Says he has to go slow and gets tired afterwards. No edema, orthopnea or PND. Wife says he feels bad in the am and then comes around. Weighs every day and if weight up 3 pounds will take  torsemide. BP typically 120/60-70. occasionally up to 150. Lowest SBP 105 was back in April.   Sleep study 3/21 AHI 2.3   Echo 08/29/19: EF 40-45%   Cardiac Studies  2D echo 05/2019 Left ventricular ejection fraction, by estimation, is 20 to 25%. The left ventricle has severely decreased function. The left ventricle demonstrates global hypokinesis. The left ventricular internal cavity size was moderately dilated. Left ventricular diastolic parameters are indeterminate. 2. Right ventricular systolic function is mildly reduced. The right ventricular size is normal. There is normal pulmonary artery systolic pressure. The estimated right ventricular systolic pressure is [id number] mmHg. 3. Left atrial size was moderately dilated. 4. The mitral valve is normal in structure and function. Mild mitral valve regurgitation. No evidence of mitral stenosis. 5. The aortic valve is tricuspid. Aortic valve regurgitation is not visualized. No aortic stenosis is present. 6. The inferior vena cava is normal in size with greater than 50% respiratory variability, suggesting right atrial pressure of 3 mmHg.   [facility] 05/26/19 Ost LAD to Prox LAD lesion is 20% stenosed.  Findings:  Ao = 100/72 (86) LV = 103/14 RA = 2 RV = 27/4 PA = 32/12 (22) PCW = 14 Fick cardiac output/index = 6.5/3.3 PVR = 1.2 WU FA sat = 99% PA sat = 75%, 76%  Assessment: 1. Minimal CAD  2. Severe NICM EF 15% 3. Well-compensated hemodynamics  cMRi 05/26/19 IMPRESSION: 1. Severely dilated LV with EF 18%, diffuse hypokinesis.  2. Moderately dilated RV with EF 28%.  3. Nonspecific inferoseptal RV  insertion site LGE, can be suggestive of pressure overload/CHF.  4. ECV percentage elevated at 39%, can see with increased fibrosis with cardiomyopathy or myocarditis. Generally amyloidosis tends to be >40%.      Roy Duke denies symptoms worrisome for COVID 19.   Past Medical History:  Diagnosis Date  .  Diabetes mellitus without complication ([place])   . Hypertension    Past Surgical History:  Procedure Laterality Date  . RIGHT/LEFT HEART CATH AND CORONARY ANGIOGRAPHY N/A 05/26/2019   Procedure: RIGHT/LEFT HEART CATH AND CORONARY ANGIOGRAPHY;  Surgeon: [name], MD;  Location: [facility];  Service: Cardiovascular;  Laterality: N/A;     Current Outpatient Medications  Medication Sig Dispense Refill  . albuterol (ACCUNEB) 0.63 MG/3ML nebulizer solution Take 1 ampule by nebulization every 4 (four) hours as needed for shortness of breath.     [name] albuterol (VENTOLIN HFA) 108 (90 Base) MCG/ACT inhaler Inhale 2 puffs into the lungs every 4 (four) hours as needed for wheezing.    [name] amiodarone (PACERONE) 200 MG tablet Take 1 tablet (200 mg total) by mouth daily. 30 tablet 11  . aspirin EC 81 MG EC tablet Take 1 tablet (81 mg total) by mouth daily. 90 tablet 0  . atorvastatin (LIPITOR) 40 MG tablet Take 40 mg by mouth daily at 6 PM.     . carvedilol (COREG) 6.25 MG tablet Take 1 tablet (6.25 mg total) by mouth 2 (two) times daily. 60 tablet 11  . digoxin (LANOXIN) 0.125 MG tablet Take 1 tablet (0.125 mg total) by mouth daily. 90 tablet 3  . latanoprost (XALATAN) 0.005 % ophthalmic solution Place 1 drop into both eyes at bedtime.    . metFORMIN (GLUCOPHAGE) 500 MG tablet Take 500 mg by mouth daily with breakfast.    . montelukast (SINGULAIR) 10 MG tablet Take 10 mg by mouth daily.    [name] omeprazole (PRILOSEC) 40 MG capsule Take 40 mg by mouth daily.    . sacubitril-valsartan (ENTRESTO) 49-51 MG Take 1 tablet by mouth 2 (two) times daily. 60 tablet 3  . spironolactone (ALDACTONE) 25 MG tablet Take 0.5 tablets (12.5 mg total) by mouth daily. 15 tablet 11  . torsemide (DEMADEX) 20 MG tablet Take 1 tablet (20 mg total) by mouth daily as needed (for weight gain). 30 tablet 1  . traMADol (ULTRAM) 50 MG tablet Take 100 mg by mouth every 6 (six) hours.     No current facility-administered  medications for this encounter.    Allergies:   Codeine and Penicillins   Social History:  The patient  reports that he quit smoking about 30 years ago. His smoking use included cigarettes. He has never used smokeless tobacco. He reports previous alcohol use. He reports previous drug use.   Family History:  The patient's family history includes CAD in his paternal uncle; Heart attack in his paternal grandfather; Heart disease in his father.   ROS:  Please see the history of present illness.   All other systems are personally reviewed and negative.   Vitals today:  164/92 (hasn't taken meds yet)   Exam:  (Video/Tele Health Call; Exam is subjective and or/visual.) General:  Speaks in full sentences. No resp difficulty. Lungs: Normal respiratory effort with conversation.  Abdomen: Non-distended per patient report Extremities: Pt denies edema. Neuro: Alert & oriented x 3.   Recent Labs: 05/27/2019: Magnesium 2.1 06/06/2019: Hemoglobin 13.8; Platelets 294 08/23/2019: ALT 34; B Natriuretic Peptide 73.2; BUN 15; Creatinine, Ser 0.97;  Potassium 5.1; Sodium 138; TSH 4.586  Personally reviewed   Wt Readings from Last 3 Encounters:  08/23/19 78.9 kg (174 lb)  07/21/19 76.2 kg (168 lb)  07/07/19 75.8 kg (167 lb)      ASSESSMENT AND PLAN:  1.Chronic Systolic Heart Failure (NICM) -Echo at [facility] 05/2019 showed LVEF of 25 to 30% with severe global hypokinesis. RV also noted to be moderately enlarged with moderately impaired RV systolic function. -LHC 05/26/19 w/ mild, nonobstructive CAD. RHC w/ compensated hemodynamics  - cMRi 2/21 EF 18% w/ nonspecific inferoseptal RV insertion site LGE. ECV percentage elevated at 39%, can see with increased fibrosis with cardiomyopathy or myocarditis.  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 PVCs now suppressed with amio - Echo 08/29/19 EF 40-45% (improved with PVC suppression) - Stable NYHA II-early III. Volume status ok Continue PRN torsemide.  -  Continue amiodarone 200 mg once daily  - Continue Coreg today, 6.25 mg bid - Increase Entresto to 97/103 bid - Continuedigoxin 0.125 mg daily.  -Continue spironolactone12.5 mg daily. (decreased due to hyperkalemia) - EF > 35% can stop LifeVest  2. PVCs - high burden ~8-10% 2/21 - PVCs suppressed with amio - Continue w/ LifeVest  - Sleep study negative - EF improved with PVC suppression. Continue amio. Eventually can cut dose or switch to mexilitene to minimize risk of side effects.   3.Hypertension - Remains elevated - Increase Entresto to 97/103   4.Hyperlipidemia - Lipid panel from [facility]: Total Cholesterol 120, Triglycerides 75, HDL 61, LDL 44.  - LHC showed mild nonobstructive CAD  - Continue Lipitor 40mg  qhs. General Cardiology to continue to follow   5.Type 2 Diabetes Mellitus -Hgb A1C 7.9  - Continue Metformin  - Consider SGLT2i   COVID screen The patient does not have any symptoms that suggest any further testing/ screening at this time.  Social distancing reinforced today.  Recommended follow-up:  As above  Relevant cardiac medications were reviewed at length with the patient today.   The patient does not have concerns regarding their medications at this time.   The following changes were made today:  As above  Today, I have spent 18 minutes with the patient with telehealth technology discussing the above issues .    Signed, Roy Bickers, MD  09/07/2019 11:52 AM  Advanced Heart Failure [place] [address] [phone] (office) [phone] (fax)

## 2019-09-14 NOTE — Progress Notes (Signed)
Patient aware of AVS instructions AVS copy mailed to patient

## 2019-09-14 NOTE — Patient Instructions (Signed)
INCREASE Entresto to 97/103 mg, one tab twice daily  Your physician recommends that you schedule a follow-up appointment in: 2-4 months with Dr Haroldine Laws   If you have any questions or concerns before your next appointment please send [place] a message through [place] or call our office at [phone].    TO LEAVE A MESSAGE FOR THE NURSE SELECT OPTION 2, PLEASE LEAVE A MESSAGE INCLUDING: . YOUR NAME . DATE OF BIRTH . CALL BACK NUMBER . REASON FOR CALL**this is important as we prioritize the call backs  YOU WILL RECEIVE A CALL BACK THE SAME DAY AS LONG AS YOU CALL BEFORE 4:00 PM  Do the following things EVERYDAY: 1) Weigh yourself in the morning before breakfast. Write it down and keep it in a log. 2) Take your medicines as prescribed 3) Eat low salt foods--Limit salt (sodium) to 2000 mg per day.  4) Stay as active as you can everyday 5) Limit all fluids for the day to less than 2 liters  At the [facility], you and your health needs are our priority. As part of our continuing mission to provide you with exceptional heart care, we have created designated Provider Care Teams. These Care Teams include your primary Cardiologist (physician) and Advanced Practice Providers (APPs- Physician Assistants and Nurse Practitioners) who all work together to provide you with the care you need, when you need it.   You may see any of the following providers on your designated Care Team at your next follow up: [name] Dr Glori Bickers . Dr [name] . Darrick Grinder, NP . Lyda Jester, PA . Audry Riles, PharmD   Please be sure to bring in all your medications bottles to every appointment.

## 2019-09-14 NOTE — Addendum Note (Signed)
Encounter addended by: Kerry Dory, CMA on: 09/14/2019 1:09 PM  Actions taken: Clinical Note Signed

## 2019-09-14 NOTE — Addendum Note (Signed)
Encounter addended by: Kerry Dory, CMA on: 09/14/2019 1:08 PM  Actions taken: Clinical Note Signed

## 2019-09-22 ENCOUNTER — Telehealth (HOSPITAL_COMMUNITY): Payer: Medicare HMO | Admitting: Internal Medicine

## 2019-09-23 ENCOUNTER — Telehealth (HOSPITAL_COMMUNITY): Payer: Self-pay | Admitting: Pharmacist

## 2019-09-23 MED ORDER — SACUBITRIL-VALSARTAN 97-103 MG PO TABS: 1.0000 | ORAL_TABLET | Freq: Two times a day (BID) | ORAL | 11 refills | Status: DC

## 2019-09-23 NOTE — Telephone Encounter (Signed)
Medication list updated with most recent Entresto dose. Roy Duke was increased to the 97/103 mg dose on 09/07/19 but updated prescription was never sent to patient's pharmacy.   Roy [facility], PharmD, BCPS, BCCP, CPP Heart Failure Clinic Pharmacist [phone]

## 2019-10-17 ENCOUNTER — Telehealth (HOSPITAL_COMMUNITY): Payer: Self-pay

## 2019-10-17 NOTE — Telephone Encounter (Signed)
Left a detailed message on patients vm to let him know that his disability paperwork was successfully faxed to [phone]. I asked patient to let us know if he wanted to pick up the original copy or if he wanted [place] to mail it tp the address we have on file for him. A copy will be scanned into the patients chart.

## 2019-10-24 ENCOUNTER — Telehealth: Payer: Self-pay | Admitting: Cardiology

## 2019-11-02 NOTE — Telephone Encounter (Signed)
No note was generated

## 2019-12-04 ENCOUNTER — Telehealth: Payer: Self-pay | Admitting: Physician Assistant

## 2019-12-04 NOTE — Telephone Encounter (Signed)
Pt called stating he had medication questions. When I called, he reported congestion and productive cough. He is not vaccinated against COVID. Cough and congestion started 4 days ago, no fever. I advised mucinex and to cal his PCP tomorrow. He expressed understanding of the plan.

## 2019-12-15 ENCOUNTER — Ambulatory Visit (HOSPITAL_COMMUNITY)
Admission: RE | Admit: 2019-12-15 | Discharge: 2019-12-15 | Disposition: A | Discharge status: Home / Self Care | Payer: Medicare HMO | Source: Ambulatory Visit | Attending: Internal Medicine | Admitting: Internal Medicine

## 2019-12-15 ENCOUNTER — Other Ambulatory Visit: Payer: Self-pay

## 2019-12-15 VITALS — BP 148/92 | HR 64 | Ht 70.0 in | Wt 171.0 lb

## 2019-12-15 DIAGNOSIS — Z8249 Family history of ischemic heart disease and other diseases of the circulatory system: Secondary | ICD-10-CM | POA: Diagnosis not present

## 2019-12-15 DIAGNOSIS — E119 Type 2 diabetes mellitus without complications: Secondary | ICD-10-CM | POA: Insufficient documentation

## 2019-12-15 DIAGNOSIS — I4510 Unspecified right bundle-branch block: Secondary | ICD-10-CM | POA: Insufficient documentation

## 2019-12-15 DIAGNOSIS — I110 Hypertensive heart disease with heart failure: Secondary | ICD-10-CM | POA: Diagnosis not present

## 2019-12-15 DIAGNOSIS — I493 Ventricular premature depolarization: Secondary | ICD-10-CM

## 2019-12-15 DIAGNOSIS — Z885 Allergy status to narcotic agent status: Secondary | ICD-10-CM | POA: Diagnosis not present

## 2019-12-15 DIAGNOSIS — I509 Heart failure, unspecified: Secondary | ICD-10-CM

## 2019-12-15 DIAGNOSIS — R0602 Shortness of breath: Secondary | ICD-10-CM

## 2019-12-15 DIAGNOSIS — Z87891 Personal history of nicotine dependence: Secondary | ICD-10-CM | POA: Insufficient documentation

## 2019-12-15 DIAGNOSIS — Z9861 Coronary angioplasty status: Secondary | ICD-10-CM | POA: Insufficient documentation

## 2019-12-15 DIAGNOSIS — J90 Pleural effusion, not elsewhere classified: Secondary | ICD-10-CM

## 2019-12-15 DIAGNOSIS — I5022 Chronic systolic (congestive) heart failure: Secondary | ICD-10-CM | POA: Insufficient documentation

## 2019-12-15 DIAGNOSIS — I5020 Unspecified systolic (congestive) heart failure: Secondary | ICD-10-CM

## 2019-12-15 DIAGNOSIS — E785 Hyperlipidemia, unspecified: Secondary | ICD-10-CM | POA: Diagnosis not present

## 2019-12-15 DIAGNOSIS — Z7982 Long term (current) use of aspirin: Secondary | ICD-10-CM | POA: Diagnosis not present

## 2019-12-15 DIAGNOSIS — Z79899 Other long term (current) drug therapy: Secondary | ICD-10-CM | POA: Diagnosis not present

## 2019-12-15 DIAGNOSIS — Z880 Allergy status to penicillin: Secondary | ICD-10-CM | POA: Diagnosis not present

## 2019-12-15 DIAGNOSIS — I428 Other cardiomyopathies: Secondary | ICD-10-CM | POA: Insufficient documentation

## 2019-12-15 DIAGNOSIS — J449 Chronic obstructive pulmonary disease, unspecified: Secondary | ICD-10-CM | POA: Diagnosis not present

## 2019-12-15 DIAGNOSIS — Z7984 Long term (current) use of oral hypoglycemic drugs: Secondary | ICD-10-CM | POA: Insufficient documentation

## 2019-12-15 LAB — COMPREHENSIVE METABOLIC PANEL
ALT: 42 U/L (ref 0–44)
AST: 30 U/L (ref 15–41)
Albumin: 3.5 g/dL (ref 3.5–5.0)
Alkaline Phosphatase: 54 U/L (ref 38–126)
Anion gap: 10 (ref 5–15)
BUN: 8 mg/dL (ref 8–23)
CO2: 24 mmol/L (ref 22–32)
Calcium: 8.9 mg/dL (ref 8.9–10.3)
Chloride: 101 mmol/L (ref 98–111)
Creatinine, Ser: 0.9 mg/dL (ref 0.61–1.24)
GFR calc Af Amer: >60 mL/min (ref >60)
GFR calc non Af Amer: >60 mL/min (ref >60)
Glucose, Bld: 113 mg/dL — ABNORMAL HIGH (ref 70–99)
Potassium: 4.4 mmol/L (ref 3.5–5.1)
Sodium: 135 mmol/L (ref 135–145)
Total Bilirubin: 0.4 mg/dL (ref 0.3–1.2)
Total Protein: 6.6 g/dL (ref 6.5–8.1)

## 2019-12-15 LAB — CBC
HCT: 35.7 % — ABNORMAL LOW (ref 39.0–52.0)
Hemoglobin: 11.8 g/dL — ABNORMAL LOW (ref 13.0–17.0)
MCH: 29.3 pg (ref 26.0–34.0)
MCHC: 33.1 g/dL (ref 30.0–36.0)
MCV: 88.6 fL (ref 80.0–100.0)
Platelets: 221 10*3/uL (ref 150–400)
RBC: 4.03 MIL/uL — ABNORMAL LOW (ref 4.22–5.81)
RDW: 13.1 % (ref 11.5–15.5)
WBC: 5.4 10*3/uL (ref 4.0–10.5)
nRBC: 0 % (ref 0.0–0.2)

## 2019-12-15 LAB — SEDIMENTATION RATE: Sed Rate: 11 mm/hr (ref 0–16)

## 2019-12-15 LAB — BRAIN NATRIURETIC PEPTIDE: B Natriuretic Peptide: 58.9 pg/mL (ref 0.0–100.0)

## 2019-12-15 IMAGING — CR DG CHEST 2V
2 series · 2 of 2 positions shown · non-contrast
Comparison: [DATE]

CLINICAL DATA: Pleural effusion.

EXAM:
CHEST - 2 VIEW

[chest pa]
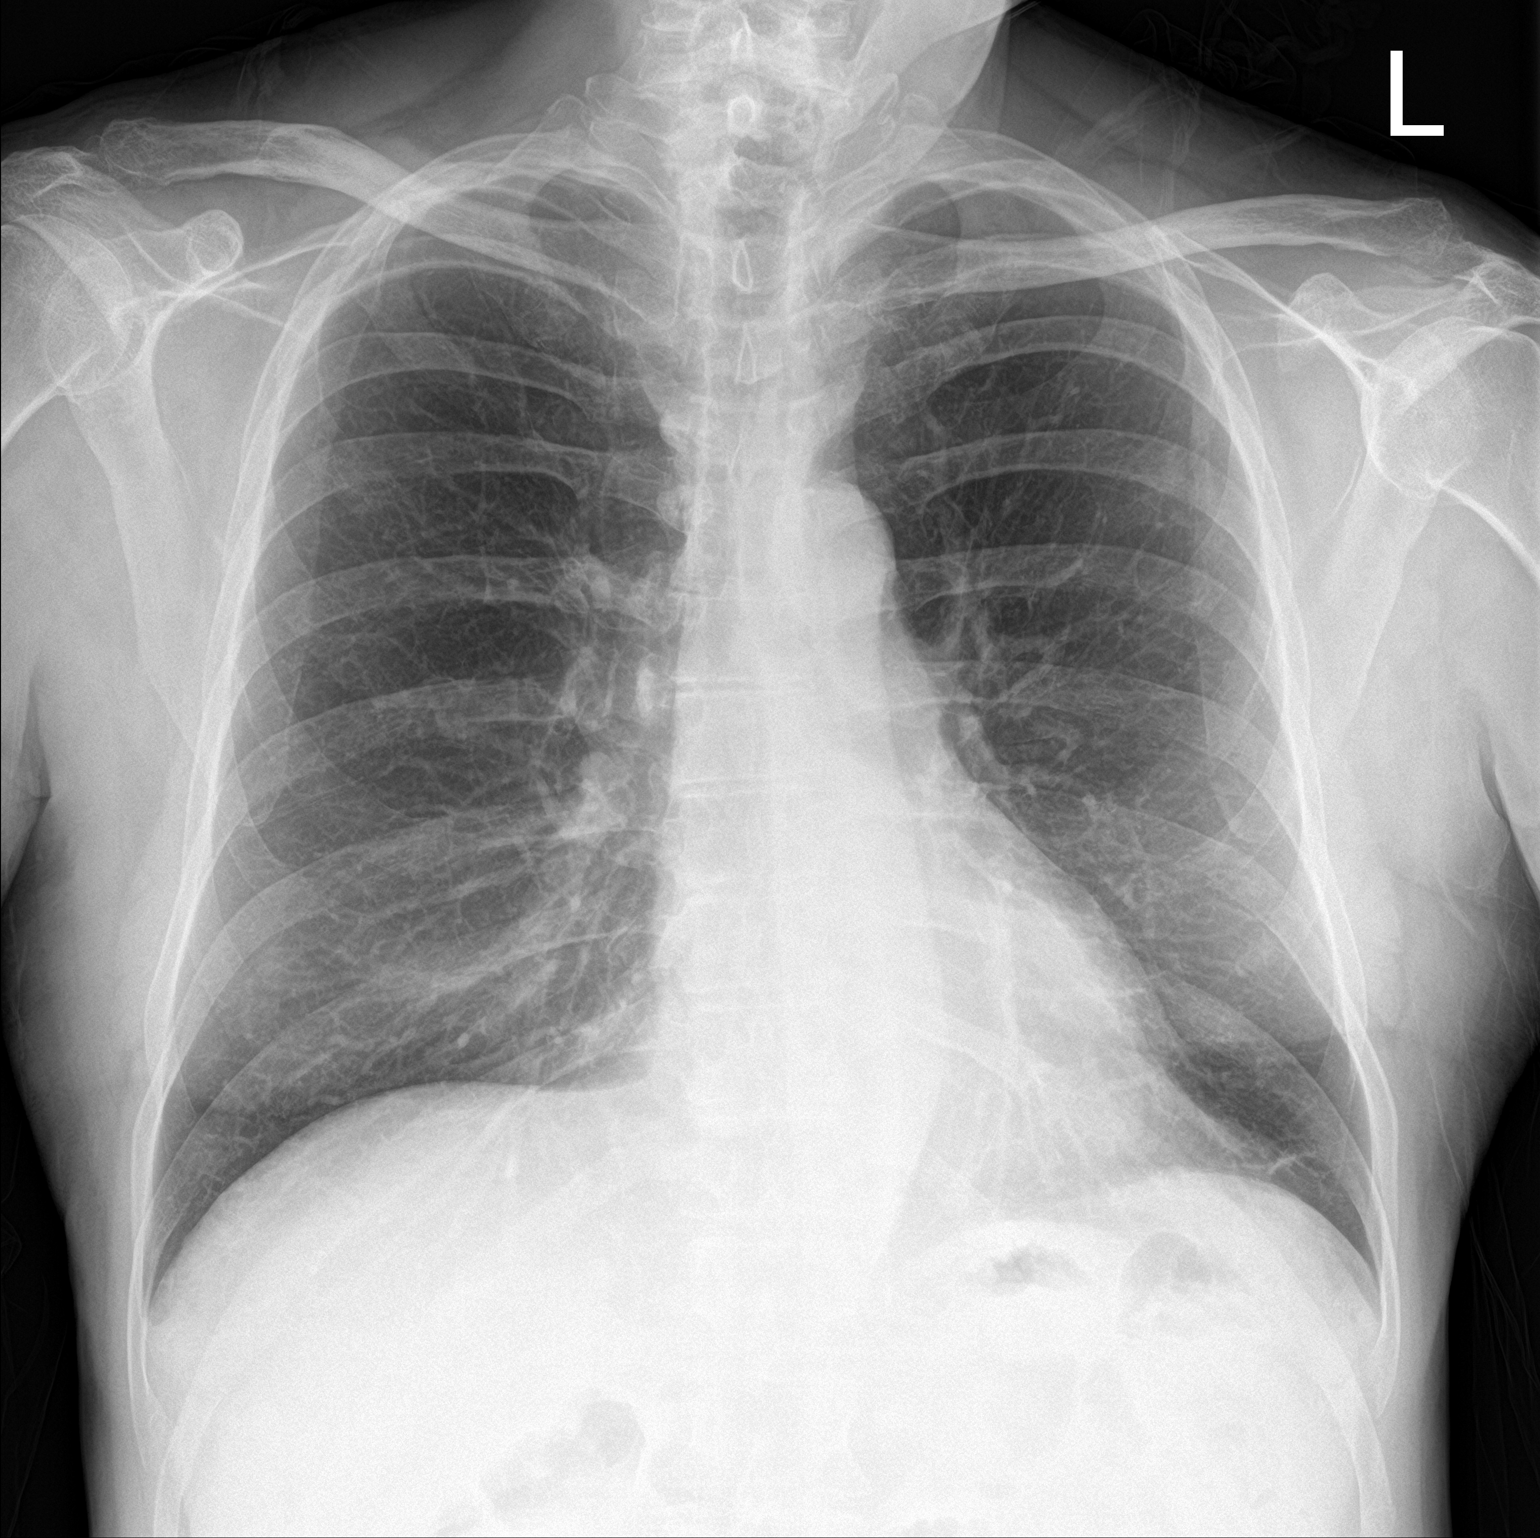

[chest lat]
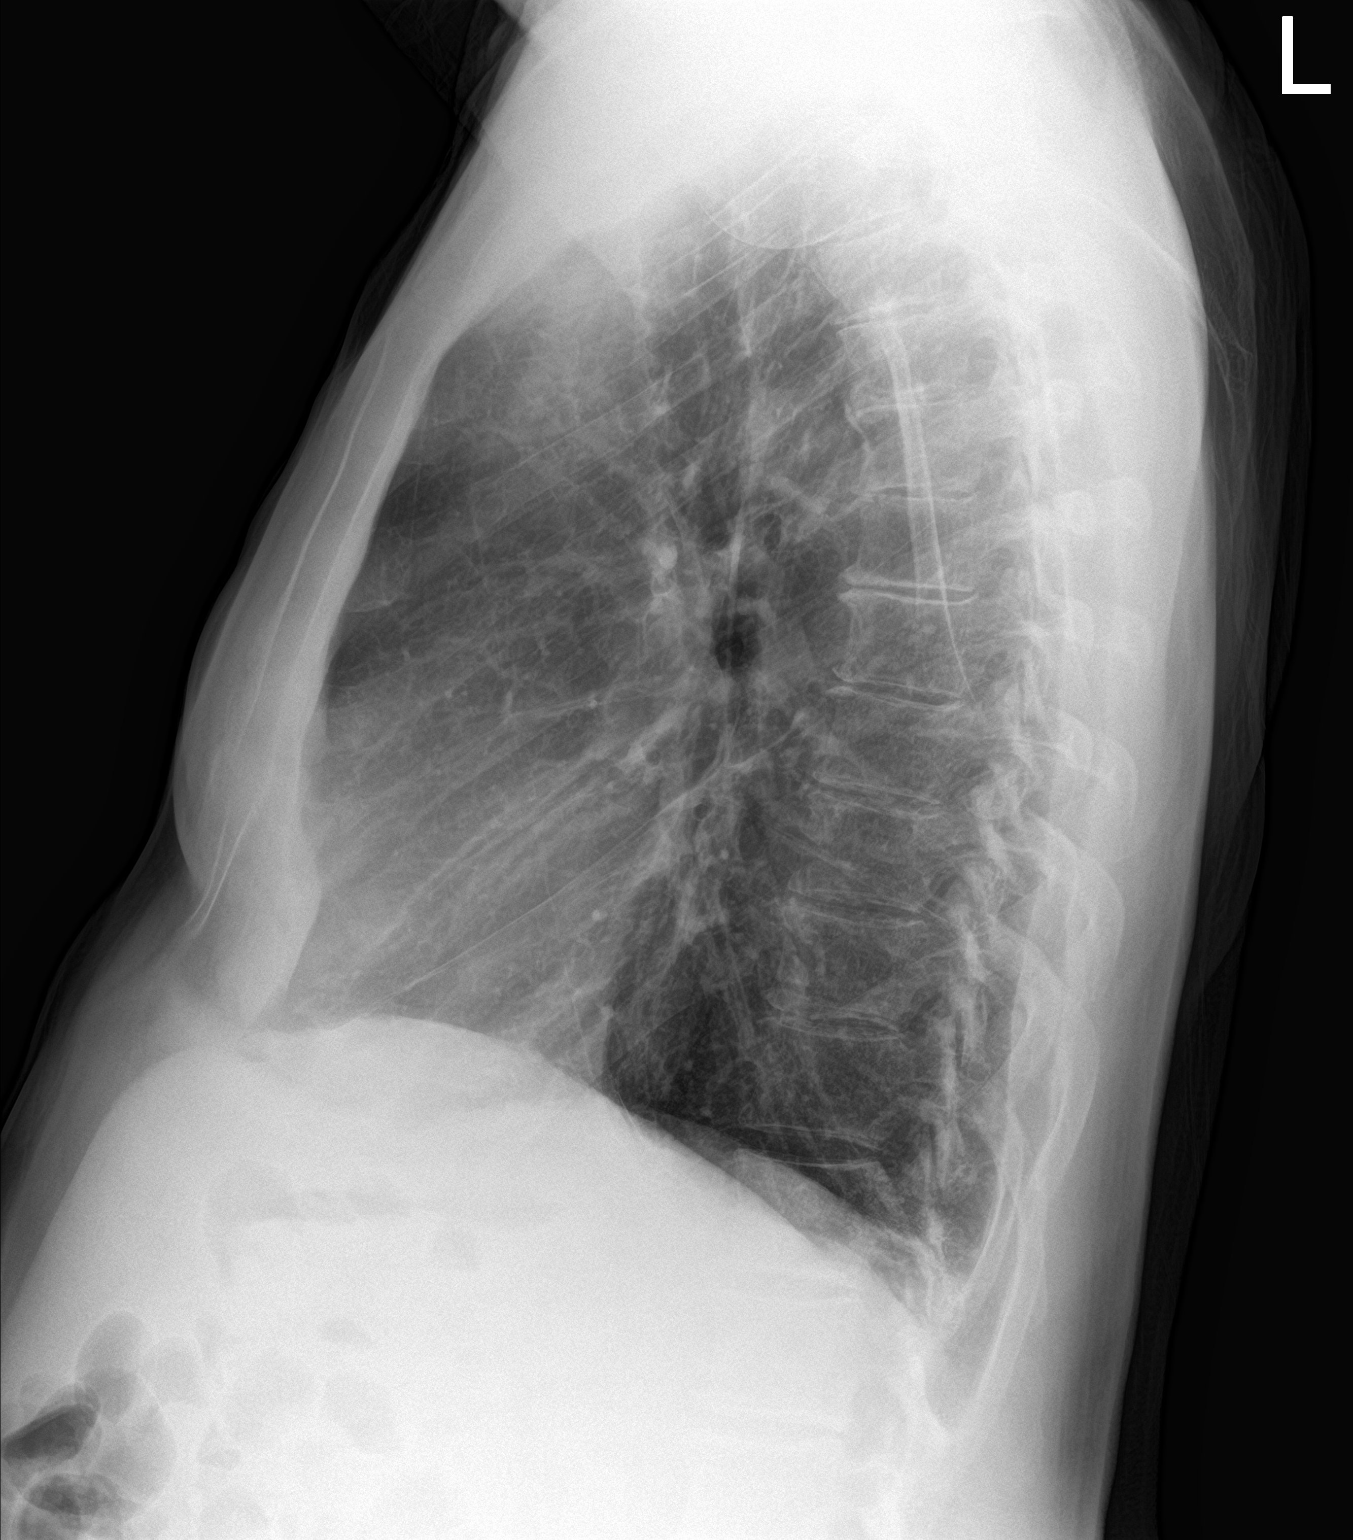

[2 of 2 positions shown; findings below may reference images not displayed]

FINDINGS: Normalization of heart size when compared to before. There is no
edema, consolidation, effusion, or pneumothorax. No osseous findings
IMPRESSION: No evidence of active disease.  Negative for pleural effusion.

## 2019-12-15 NOTE — Progress Notes (Signed)
Advanced HC Clinic Note   Date:  12/15/2019   ID:  Roy [facility], DOB 1951/03/07, MRN [id number]  Location: Home  Provider location: [facility] Type of Visit: Established patient  PCP:  System, Pcp Not In  Cardiologist:  No primary care provider on file. Primary HF: [name]  Chief Complaint: Heart Failure follow-up   History of Present Illness:  Roy [facility] is a 69 y/o male with HTN, HL, DM2, former tobacco, ETOH use (quit 30 years ago) and systolic HF due to NICM.  Admitted in 12/20 to [facility] with severe hyponatremia and HF symptoms. Echo with EF 25%. Developed NSVT with polymorphic ectopy. ECG with RBBB and new lateral TWI. He was transferred to [facility] for [facility] and AHF consultation.   He was diagnosed w/ a NICM. LHC w/ mild, nonobstructive CAD. Well compensated hemodynamics on RHC. cMRi EF 28% w/ nonspecific inferoseptal RV insertion site LGE. ECV percentage elevated at 39% (can see with increased fibrosis with cardiomyopathy or myocarditis. Generally amyloidosis tends to be >40%.). 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  He was started on amiodarone for suppression of PVCs and fitted w/ a LifeVest prior to discharge.   He re for f/u visit today with his wife.  Feeling pretty good  Takes torsemide as needed BP at 120/70s. No edema, orthopnea or PND. Has been walking 2 miles every day until came down with bronchitis. COVID negative several times.    Sleep study 3/21 AHI 2.3   Echo 08/29/19: EF 40-45%   Cardiac Studies  2D echo 05/2019 Left ventricular ejection fraction, by estimation, is 20 to 25%. The left ventricle has severely decreased function. The left ventricle demonstrates global hypokinesis. The left ventricular internal cavity size was moderately dilated. Left ventricular diastolic parameters are indeterminate. 2. Right ventricular systolic function is mildly reduced. The right ventricular size is  normal. There is normal pulmonary artery systolic pressure. The estimated right ventricular systolic pressure is 63.7 mmHg. 3. Left atrial size was moderately dilated. 4. The mitral valve is normal in structure and function. Mild mitral valve regurgitation. No evidence of mitral stenosis. 5. The aortic valve is tricuspid. Aortic valve regurgitation is not visualized. No aortic stenosis is present. 6. The inferior vena cava is normal in size with greater than 50% respiratory variability, suggesting right atrial pressure of 3 mmHg.   [facility] 05/26/19 Ost LAD to Prox LAD lesion is 20% stenosed.  Findings:  Ao = 100/72 (86) LV = 103/14 RA = 2 RV = 27/4 PA = 32/12 (22) PCW = 14 Fick cardiac output/index = 6.5/3.3 PVR = 1.2 WU FA sat = 99% PA sat = 75%, 76%  Assessment: 1. Minimal CAD  2. Severe NICM EF 15% 3. Well-compensated hemodynamics  cMRi 05/26/19 IMPRESSION: 1. Severely dilated LV with EF 18%, diffuse hypokinesis.  2. Moderately dilated RV with EF 28%.  3. Nonspecific inferoseptal RV insertion site LGE, can be suggestive of pressure overload/CHF.  4. ECV percentage elevated at 39%, can see with increased fibrosis with cardiomyopathy or myocarditis. Generally amyloidosis tends to be >40%.       Past Medical History:  Diagnosis Date  . Diabetes mellitus without complication ([name])   . Hypertension    Past Surgical History:  Procedure Laterality Date  . RIGHT/LEFT HEART CATH AND CORONARY ANGIOGRAPHY N/A 05/26/2019   Procedure: RIGHT/LEFT HEART CATH AND CORONARY ANGIOGRAPHY;  Surgeon: [name], MD;  Location: [facility];  Service: Cardiovascular;  Laterality: N/A;     Current Outpatient Medications  Medication Sig Dispense Refill  . albuterol (ACCUNEB) 0.63 MG/3ML nebulizer solution Take 1 ampule by nebulization every 4 (four) hours as needed for shortness of breath.     [name] albuterol (VENTOLIN HFA) 108 (90 Base) MCG/ACT inhaler  Inhale 2 puffs into the lungs every 4 (four) hours as needed for wheezing.    [name] amiodarone (PACERONE) 200 MG tablet Take 1 tablet (200 mg total) by mouth daily. 30 tablet 11  . aspirin EC 81 MG EC tablet Take 1 tablet (81 mg total) by mouth daily. 90 tablet 0  . atorvastatin (LIPITOR) 40 MG tablet Take 40 mg by mouth daily at 6 PM.     . carvedilol (COREG) 6.25 MG tablet Take 1 tablet (6.25 mg total) by mouth 2 (two) times daily. 60 tablet 11  . digoxin (LANOXIN) 0.125 MG tablet Take 1 tablet (0.125 mg total) by mouth daily. 90 tablet 3  . latanoprost (XALATAN) 0.005 % ophthalmic solution Place 1 drop into both eyes at bedtime.    . metFORMIN (GLUCOPHAGE) 500 MG tablet Take 500 mg by mouth 2 (two) times daily with a meal.     . montelukast (SINGULAIR) 10 MG tablet Take 10 mg by mouth daily.    [name] omeprazole (PRILOSEC) 40 MG capsule Take 40 mg by mouth daily.    . sacubitril-valsartan (ENTRESTO) 97-103 MG Take 1 tablet by mouth 2 (two) times daily. 60 tablet 11  . spironolactone (ALDACTONE) 25 MG tablet Take 0.5 tablets (12.5 mg total) by mouth daily. 15 tablet 11  . torsemide (DEMADEX) 20 MG tablet Take 1 tablet (20 mg total) by mouth daily as needed (for weight gain). 30 tablet 1  . traMADol (ULTRAM) 50 MG tablet Take 100 mg by mouth every 6 (six) hours.     No current facility-administered medications for this encounter.    Allergies:   Codeine and Penicillins   Social History:  The patient  reports that he quit smoking about 30 years ago. His smoking use included cigarettes. He has never used smokeless tobacco. He reports previous alcohol use. He reports previous drug use.   Family History:  The patient's family history includes CAD in his paternal uncle; Heart attack in his paternal grandfather; Heart disease in his father.   ROS:  Please see the history of present illness.   All other systems are personally reviewed and negative.   Vitals:   12/15/19 1346  BP: (!) 148/92  Pulse: 64   SpO2: 98%  Weight: 77.6 kg (171 lb)  Height: 5' 10"  (1.778 m)    Exam:   General:  Well appearing. No resp difficulty HEENT: normal Neck: supple. no JVD. Carotids 2+ bilat; no bruits. No lymphadenopathy or thryomegaly appreciated. Cor: PMI nondisplaced. Regular rate & rhythm. No rubs, gallops or murmurs. Lungs: clear with decreased BS  No wehheze Abdomen: soft, nontender, nondistended. No hepatosplenomegaly. No bruits or masses. Good bowel sounds. Extremities: no cyanosis, clubbing, rash, edema Neuro: alert & orientedx3, cranial nerves grossly intact. moves all 4 extremities w/o difficulty. Affect pleasant   Recent Labs: 05/27/2019: Magnesium 2.1 06/06/2019: Hemoglobin 13.8; Platelets 294 08/23/2019: ALT 34; B Natriuretic Peptide 73.2; BUN 15; Creatinine, Ser 0.97; Potassium 5.1; Sodium 138; TSH 4.586  Personally reviewed   Wt Readings from Last 3 Encounters:  12/15/19 77.6 kg (171 lb)  08/23/19 78.9 kg (174 lb)  07/21/19 76.2 kg (168 lb)  ASSESSMENT AND PLAN:  1.Chronic Systolic Heart Failure (NICM) -Echo at [facility] 05/2019 showed LVEF of 25 to 30% with severe global hypokinesis. RV also noted to be moderately enlarged with moderately impaired RV systolic function. -LHC 05/26/19 w/ mild, nonobstructive CAD. RHC w/ compensated hemodynamics  - cMRi 2/21 EF 18% w/ nonspecific inferoseptal RV insertion site LGE. ECV percentage elevated at 39%, can see with increased fibrosis with cardiomyopathy or myocarditis.  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 PVCs now suppressed with amio - Echo 08/29/19 EF 40-45% (improved with PVC suppression) - Improved NYHA II Volume status ok Continue PRN torsemide.  - Continue amiodarone 200 mg once daily  - Continue carvedilol 6.25 mg bid - Continue Entresto 97/103 bid - Continuedigoxin 0.125 mg daily.  -Continue spironolactone12.5 mg daily. (decreased due to hyperkalemia) - repeat echo. If EF still down. Consider SGLT2i - Labs  today  2. PVCs - high burden ~8-10% 2/21 - PVCs suppressed with amio - Sleep study negative - EF improved with PVC suppression. Continue amio.  - Will repeat echo. If EF completely improved will switch amio  to mexilitene to minimize risk of side effects.   3.Hypertension - BP high here but controlled at home  4.Hyperlipidemia - Lipid panel from [place]: Total Cholesterol 120, Triglycerides 75, HDL 61, LDL 44.  - LHC showed mild nonobstructive CAD  - Continue Lipitor 7m qhs. General Cardiology to continue to follow   5.Type 2 Diabetes Mellitus -Hgb A1C 7.9  - Continue Metformin  - Consider SGLT2i   6. Bronchitis +/- COPD - check CXR - check PFTs with DLCO and ESR - Refer to Pulmoanry    Signed, DGlori Bickers MD  12/15/2019 1:55 PM  Advanced Heart Failure [id number] and [id number]([phone](office) ([phone](fax)

## 2019-12-15 NOTE — Patient Instructions (Signed)
STOP Digoxin  A chest x-ray takes a picture of the organs and structures inside the chest, including the heart, lungs, and blood vessels. This test can show several things, including, whether the heart is enlarges; whether fluid is building up in the lungs; and whether pacemaker / defibrillator leads are still in place.  You have been referred to Pulmonary Rehab here at [facility]. They will contact you to schedule you an appointment.   Please call our office in January to schedule your follow up appointment with an echocardiogram  Your physician has requested that you have an echocardiogram. Echocardiography is a painless test that uses sound waves to create images of your heart. It provides your doctor with information about the size and shape of your heart and how well your heart's chambers and valves are working. This procedure takes approximately one hour. There are no restrictions for this procedure.  Labs done today, your results will be available in MyChart, we will contact you for abnormal readings.  Your physician has recommended that you have a pulmonary function test. Pulmonary Function Tests are a group of tests that measure how well air moves in and out of your lungs.  If you have any questions or concerns before your next appointment please send [place] a message through [place] or call our office at [phone].    TO LEAVE A MESSAGE FOR THE NURSE SELECT OPTION 2, PLEASE LEAVE A MESSAGE INCLUDING: . YOUR NAME . DATE OF BIRTH . CALL BACK NUMBER . REASON FOR CALL**this is important as we prioritize the call backs  [place] AS LONG AS YOU CALL BEFORE 4:00 PM  At the [facility], you and your health needs are our priority. As part of our continuing mission to provide you with exceptional heart care, we have created designated Provider Care Teams. These Care Teams include your primary Cardiologist (physician) and Advanced Practice  Providers (APPs- Physician Assistants and Nurse Practitioners) who all work together to provide you with the care you need, when you need it.   You may see any of the following providers on your designated Care Team at your next follow up: [name] Dr Glori Bickers . Dr [name] . Darrick Grinder, NP . Lyda Jester, PA . Audry Riles, PharmD   Please be sure to bring in all your medications bottles to every appointment.

## 2019-12-16 ENCOUNTER — Other Ambulatory Visit: Payer: Self-pay | Admitting: *Deleted

## 2019-12-16 DIAGNOSIS — I5022 Chronic systolic (congestive) heart failure: Secondary | ICD-10-CM

## 2019-12-30 ENCOUNTER — Other Ambulatory Visit: Payer: Self-pay

## 2019-12-30 ENCOUNTER — Ambulatory Visit (HOSPITAL_COMMUNITY)
Admission: RE | Admit: 2019-12-30 | Discharge: 2019-12-30 | Disposition: A | Discharge status: Home / Self Care | Payer: Medicare HMO | Source: Ambulatory Visit | Attending: Internal Medicine | Admitting: Internal Medicine

## 2019-12-30 DIAGNOSIS — J90 Pleural effusion, not elsewhere classified: Secondary | ICD-10-CM | POA: Insufficient documentation

## 2019-12-30 DIAGNOSIS — R0602 Shortness of breath: Secondary | ICD-10-CM | POA: Insufficient documentation

## 2019-12-30 LAB — PULMONARY FUNCTION TEST
DL/VA % pred: 89 %
DL/VA: 3.63 ml/min/mmHg/L
DLCO cor % pred: 79 %
DLCO cor: 20.74 ml/min/mmHg
DLCO unc % pred: 72 %
DLCO unc: 18.89 ml/min/mmHg
FEF 25-75 Post: 1.89 L/sec
FEF 25-75 Pre: 2.17 L/sec
FEF2575-%Change-Post: -12 %
FEF2575-%Pred-Post: 74 %
FEF2575-%Pred-Pre: 85 %
FEV1-%Change-Post: 4 %
FEV1-%Pred-Post: 81 %
FEV1-%Pred-Pre: 77 %
FEV1-Post: 2.67 L
FEV1-Pre: 2.55 L
FEV1FVC-%Change-Post: 0 %
FEV1FVC-%Pred-Pre: 90 %
FEV6-%Change-Post: 14 %
FEV6-%Pred-Post: 89 %
FEV6-%Pred-Pre: 77 %
FEV6-Post: 3.76 L
FEV6-Pre: 3.28 L
FEV6FVC-%Change-Post: -1 %
FEV6FVC-%Pred-Post: 101 %
FEV6FVC-%Pred-Pre: 103 %
FVC-%Change-Post: 4 %
FVC-%Pred-Post: 90 %
FVC-%Pred-Pre: 85 %
FVC-Post: 4.02 L
FVC-Pre: 3.84 L
Post FEV1/FVC ratio: 66 %
Post FEV6/FVC ratio: 96 %
Pre FEV1/FVC ratio: 67 %
Pre FEV6/FVC Ratio: 98 %
RV % pred: 123 %
RV: 3 L
TLC % pred: 99 %
TLC: 6.96 L

## 2019-12-30 MED ORDER — ALBUTEROL SULFATE (2.5 MG/3ML) 0.083% IN NEBU
2.5000 mg | INHALATION_SOLUTION | Freq: Once | RESPIRATORY_TRACT | Status: AC
  Administered 2019-12-30: 2.5 mg via RESPIRATORY_TRACT

## 2020-02-21 ENCOUNTER — Telehealth (HOSPITAL_COMMUNITY): Payer: Self-pay | Admitting: *Deleted

## 2020-02-21 NOTE — Telephone Encounter (Signed)
Pt called stating his bp is high in the morning but a couple of hours after taking medications bp is normal. This morning he woke up with a headache so he checked his bp it was 185/90. Pt took meds and bp is now 121/68. Pt would like to know if he needs to make any medication changes so that his bp isnt so high when he wakes up.  Routed to [place] for advice

## 2020-02-21 NOTE — Telephone Encounter (Signed)
Please have him keep BP log with readings in am and at night (and including timing of meds) and have him send to [place].   Can give him doxazosin 1mg  tabs to take for SBP > 170

## 2020-02-23 NOTE — Telephone Encounter (Signed)
Called pt no answer/left vm requesting return call.  

## 2020-04-09 ENCOUNTER — Telehealth (HOSPITAL_COMMUNITY): Payer: Self-pay | Admitting: Pharmacist

## 2020-04-09 MED ORDER — CARVEDILOL 6.25 MG PO TABS: 6.2500 mg | ORAL_TABLET | Freq: Two times a day (BID) | ORAL | 3 refills | Status: DC

## 2020-04-09 NOTE — Telephone Encounter (Signed)
Sent refill of carvedilol to [facility] per patient request.   Karle Plumber, PharmD, BCPS, BCCP, CPP Heart Failure Clinic Pharmacist [phone]

## 2020-04-23 NOTE — Progress Notes (Signed)
[facility] Clinic Note   Date:  04/23/2020   ID:  [facility], DOB [birth year], MRN [id number]  Location: Home  Provider location: [place] [facility] Type of Visit: Established patient  PCP:  [name], MD  Cardiologist:  No primary care provider on file. Primary HF: [facility]  Chief Complaint: Heart Failure follow-up   History of Present Illness:  Roy [facility] is a 70 y/o male with HTN, HL, DM2, former tobacco, ETOH use (quit 30 years ago) and systolic HF due to NICM.  Admitted 12/20 to [facility] with severe hyponatremia and HF.  Echo EF 25%. Developed NSVT with polymorphic ectopy. ECG with RBBB and new lateral TWI. He was transferred to [facility].  LHC w/ mild, nonobstructive CAD. Well compensated hemodynamics on RHC. cMRi EF 28% w/ nonspecific inferoseptal RV insertion site LGE. ECV percentage elevated at 39% (can see with increased fibrosis with cardiomyopathy or myocarditis. Generally amyloidosis tends to be >40%.). 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  He was started on amiodarone for suppression of PVCs and fitted w/ a LifeVest prior to discharge.   Sleep study 3/21 AHI 2.3   Echo 08/29/19: EF 40-45%  Echo today 04/24/20 EF 35-40% RV mildly HK. Personally reviewed   Here for f/u with his wife. Had COVID 2 weeks ago. Had severe HA otherwise not too bad. Can do basic activity without any problem. [facility] [place] [place] now not [facility] due to [place]. Can go up a few flight of steps with only mild SOB but this has been much improved. No orthopnea or PND. SBP 120-130s at home.    Cardiac Studies  2D echo 05/2019 Left ventricular ejection fraction, by estimation, is 20 to 25%. The left ventricle has severely decreased function. The left ventricle demonstrates global hypokinesis. The left ventricular internal cavity size was moderately dilated. Left ventricular diastolic parameters  are indeterminate. 2. Right ventricular systolic function is mildly reduced. The right ventricular size is normal. There is normal pulmonary artery systolic pressure. The estimated right ventricular systolic pressure is 22.9 mmHg. 3. Left atrial size was moderately dilated. 4. The mitral valve is normal in structure and function. Mild mitral valve regurgitation. No evidence of mitral stenosis. 5. The aortic valve is tricuspid. Aortic valve regurgitation is not visualized. No aortic stenosis is present. 6. The inferior vena cava is normal in size with greater than 50% respiratory variability, suggesting right atrial pressure of 3 mmHg.   [facility] 05/26/19 Ost LAD to Prox LAD lesion is 20% stenosed.  Findings:  Ao = 100/72 (86) LV = 103/14 RA = 2 RV = 27/4 PA = 32/12 (22) PCW = 14 Fick cardiac output/index = 6.5/3.3 PVR = 1.2 WU FA sat = 99% PA sat = 75%, 76%  Assessment: 1. Minimal CAD  2. Severe NICM EF 15% 3. Well-compensated hemodynamics  cMRi 05/26/19 IMPRESSION: 1. Severely dilated LV with EF 18%, diffuse hypokinesis.  2. Moderately dilated RV with EF 28%.  3. Nonspecific inferoseptal RV insertion site LGE, can be suggestive of pressure overload/CHF.  4. ECV percentage elevated at 39%, can see with increased fibrosis with cardiomyopathy or myocarditis. Generally amyloidosis tends to be >40%.       Past Medical History:  Diagnosis Date  . Diabetes mellitus without complication ([place])   . Hypertension    Past Surgical History:  Procedure Laterality Date  . RIGHT/LEFT HEART CATH AND  CORONARY ANGIOGRAPHY N/A 05/26/2019   Procedure: RIGHT/LEFT HEART CATH AND CORONARY ANGIOGRAPHY;  Surgeon: [name], MD;  Location: [facility];  Service: Cardiovascular;  Laterality: N/A;     Current Outpatient Medications  Medication Sig Dispense Refill  . albuterol (ACCUNEB) 0.63 MG/3ML nebulizer solution Take 1 ampule by nebulization every 4  (four) hours as needed for shortness of breath.     [name] albuterol (VENTOLIN HFA) 108 (90 Base) MCG/ACT inhaler Inhale 2 puffs into the lungs every 4 (four) hours as needed for wheezing.    [name] amiodarone (PACERONE) 200 MG tablet Take 1 tablet (200 mg total) by mouth daily. 30 tablet 11  . aspirin EC 81 MG EC tablet Take 1 tablet (81 mg total) by mouth daily. 90 tablet 0  . atorvastatin (LIPITOR) 40 MG tablet Take 40 mg by mouth daily at 6 PM.     . carvedilol (COREG) 6.25 MG tablet Take 1 tablet (6.25 mg total) by mouth 2 (two) times daily. 180 tablet 3  . latanoprost (XALATAN) 0.005 % ophthalmic solution Place 1 drop into both eyes at bedtime.    . metFORMIN (GLUCOPHAGE) 500 MG tablet Take 500 mg by mouth 2 (two) times daily with a meal.     . montelukast (SINGULAIR) 10 MG tablet Take 10 mg by mouth daily.    [name] omeprazole (PRILOSEC) 40 MG capsule Take 40 mg by mouth daily.    . sacubitril-valsartan (ENTRESTO) 97-103 MG Take 1 tablet by mouth 2 (two) times daily. 60 tablet 11  . spironolactone (ALDACTONE) 25 MG tablet Take 0.5 tablets (12.5 mg total) by mouth daily. 15 tablet 11  . torsemide (DEMADEX) 20 MG tablet Take 1 tablet (20 mg total) by mouth daily as needed (for weight gain). 30 tablet 1  . traMADol (ULTRAM) 50 MG tablet Take 100 mg by mouth every 6 (six) hours.     No current facility-administered medications for this encounter.    Allergies:   Codeine and Penicillins   Social History:  The patient  reports that he quit smoking about 31 years ago. His smoking use included cigarettes. He has never used smokeless tobacco. He reports previous alcohol use. He reports previous drug use.   Family History:  The patient's family history includes CAD in his paternal uncle; Heart attack in his paternal grandfather; Heart disease in his father.   ROS:  Please see the history of present illness.   All other systems are personally reviewed and negative.   Vitals:   04/24/20 1431  BP: (!) 162/90   Pulse: 62  SpO2: 97%  Weight: 80.4 kg (177 lb 3.2 oz)    Exam:   General:  Well appearing. No resp difficulty HEENT: normal Neck: supple. no JVD. Carotids 2+ bilat; no bruits. No lymphadenopathy or thryomegaly appreciated. Cor: PMI nondisplaced. Regular rate & rhythm. No rubs, gallops or murmurs. Lungs: clear Abdomen: soft, nontender, nondistended. No hepatosplenomegaly. No bruits or masses. Good bowel sounds. Extremities: no cyanosis, clubbing, rash, edema Neuro: alert & orientedx3, cranial nerves grossly intact. moves all 4 extremities w/o difficulty. Affect pleasant  Recent Labs: 05/27/2019: Magnesium 2.1 08/23/2019: TSH 4.586 12/15/2019: ALT 42; B Natriuretic Peptide 58.9; BUN 8; Creatinine, Ser 0.90; Hemoglobin 11.8; Platelets 221; Potassium 4.4; Sodium 135  Personally reviewed   Wt Readings from Last 3 Encounters:  12/15/19 77.6 kg (171 lb)  08/23/19 78.9 kg (174 lb)  07/21/19 76.2 kg (168 lb)      ASSESSMENT AND PLAN:  1.Chronic  Systolic Heart Failure (NICM) -Echo @ [facility] [place] 2/21 EF of 25 to 30% with severe global hypokinesis. RV also noted to be moderately enlarged with moderately impaired RV systolic function. -LHC 05/26/19 w/ mild, nonobstructive CAD. RHC w/ compensated hemodynamics  - cMRI 2/21 EF 18% w/ nonspecific inferoseptal RV insertion site LGE. ECV percentage elevated at 39%, can see w/ increased fibrosis with cardiomyopathy or myocarditis. Suspect PVC also contributing. PVCs now suppressed with amio.  - Echo 08/29/19 EF 40-45% (improved with PVC suppression) - Echo today 04/24/20 EF 35-40% RV mildly HK. Personally reviewed  - Stable NYHA II Volume status ok Continue PRN torsemide.  - Continue amiodarone 200 mg once daily  - Continue carvedilol 6.25 mg bid - Continue Entresto 97/103 bid - Continuedigoxin 0.125 mg daily.  -Continue spironolactone12.5 mg daily. (decreased due to hyperkalemia) - Add Farxiga 10 - EF still down on echo today despite apparent  PVC suppression. Will repeat cMRI to assess for decrease of fibrosis. May need PET scan.   2. PVCs - Zio 2/21 hburden ~8-10% - PVCs suppressed with amio but EF has not normalized - Sleep study negative - Will repeat Zio to requantify PVC burden. Consider eventual switch of amio to mexilitene  3.Hypertension - BP high here but controlled at home  4.Hyperlipidemia - Continue Lipitor 40mg  qhs. General Cardiology to continue to follow   5.Type 2 Diabetes Mellitus -Hgb A1C 7.9  - Continue Metformin  - Add Farxiga 10   6. Bronchitis +/- COPD - PFTs 9/21 Mild obstruction FEV1 2.55L (77%)  Signed, Glori Bickers, MD  04/23/2020 9:52 PM  Advanced Heart Failure [place] [address] Heart and [place] [address] [phone] (office) [phone] (fax)

## 2020-04-24 ENCOUNTER — Other Ambulatory Visit: Payer: Self-pay

## 2020-04-24 ENCOUNTER — Encounter (HOSPITAL_COMMUNITY): Payer: Self-pay | Admitting: Internal Medicine

## 2020-04-24 ENCOUNTER — Ambulatory Visit (HOSPITAL_COMMUNITY)
Admission: RE | Admit: 2020-04-24 | Discharge: 2020-04-24 | Disposition: A | Discharge status: Home / Self Care | Payer: Medicare HMO | Source: Ambulatory Visit | Attending: Internal Medicine | Admitting: Internal Medicine

## 2020-04-24 ENCOUNTER — Telehealth (HOSPITAL_COMMUNITY): Payer: Self-pay | Admitting: Pharmacy Technician

## 2020-04-24 ENCOUNTER — Ambulatory Visit (HOSPITAL_BASED_OUTPATIENT_CLINIC_OR_DEPARTMENT_OTHER)
Admission: RE | Admit: 2020-04-24 | Discharge: 2020-04-24 | Disposition: A | Discharge status: Home / Self Care | Payer: Medicare HMO | Source: Ambulatory Visit | Attending: Internal Medicine | Admitting: Internal Medicine

## 2020-04-24 ENCOUNTER — Other Ambulatory Visit (HOSPITAL_COMMUNITY): Payer: Self-pay | Admitting: Internal Medicine

## 2020-04-24 VITALS — BP 162/90 | HR 62 | Wt 177.2 lb

## 2020-04-24 DIAGNOSIS — J449 Chronic obstructive pulmonary disease, unspecified: Secondary | ICD-10-CM | POA: Diagnosis not present

## 2020-04-24 DIAGNOSIS — I509 Heart failure, unspecified: Secondary | ICD-10-CM

## 2020-04-24 DIAGNOSIS — Z8616 Personal history of COVID-19: Secondary | ICD-10-CM | POA: Insufficient documentation

## 2020-04-24 DIAGNOSIS — I10 Essential (primary) hypertension: Secondary | ICD-10-CM

## 2020-04-24 DIAGNOSIS — Z8249 Family history of ischemic heart disease and other diseases of the circulatory system: Secondary | ICD-10-CM | POA: Diagnosis not present

## 2020-04-24 DIAGNOSIS — I2510 Atherosclerotic heart disease of native coronary artery without angina pectoris: Secondary | ICD-10-CM | POA: Insufficient documentation

## 2020-04-24 DIAGNOSIS — I110 Hypertensive heart disease with heart failure: Secondary | ICD-10-CM | POA: Diagnosis not present

## 2020-04-24 DIAGNOSIS — Z7984 Long term (current) use of oral hypoglycemic drugs: Secondary | ICD-10-CM | POA: Diagnosis not present

## 2020-04-24 DIAGNOSIS — I5022 Chronic systolic (congestive) heart failure: Secondary | ICD-10-CM

## 2020-04-24 DIAGNOSIS — E119 Type 2 diabetes mellitus without complications: Secondary | ICD-10-CM | POA: Insufficient documentation

## 2020-04-24 DIAGNOSIS — Z7982 Long term (current) use of aspirin: Secondary | ICD-10-CM | POA: Insufficient documentation

## 2020-04-24 DIAGNOSIS — Z7901 Long term (current) use of anticoagulants: Secondary | ICD-10-CM | POA: Diagnosis not present

## 2020-04-24 DIAGNOSIS — E785 Hyperlipidemia, unspecified: Secondary | ICD-10-CM | POA: Diagnosis not present

## 2020-04-24 DIAGNOSIS — I428 Other cardiomyopathies: Secondary | ICD-10-CM | POA: Insufficient documentation

## 2020-04-24 DIAGNOSIS — Z87891 Personal history of nicotine dependence: Secondary | ICD-10-CM | POA: Diagnosis not present

## 2020-04-24 DIAGNOSIS — I493 Ventricular premature depolarization: Secondary | ICD-10-CM

## 2020-04-24 DIAGNOSIS — Z79899 Other long term (current) drug therapy: Secondary | ICD-10-CM | POA: Insufficient documentation

## 2020-04-24 HISTORY — DX: COVID-19: U07.1

## 2020-04-24 HISTORY — DX: Heart failure, unspecified: I50.9

## 2020-04-24 LAB — COMPREHENSIVE METABOLIC PANEL
ALT: 29 U/L (ref 0–44)
AST: 31 U/L (ref 15–41)
Albumin: 3.9 g/dL (ref 3.5–5.0)
Alkaline Phosphatase: 37 U/L — ABNORMAL LOW (ref 38–126)
Anion gap: 10 (ref 5–15)
BUN: 9 mg/dL (ref 8–23)
CO2: 25 mmol/L (ref 22–32)
Calcium: 8.9 mg/dL (ref 8.9–10.3)
Chloride: 103 mmol/L (ref 98–111)
Creatinine, Ser: 0.91 mg/dL (ref 0.61–1.24)
GFR, Estimated: >60 mL/min (ref >60)
Glucose, Bld: 99 mg/dL (ref 70–99)
Potassium: 4.2 mmol/L (ref 3.5–5.1)
Sodium: 138 mmol/L (ref 135–145)
Total Bilirubin: 0.4 mg/dL (ref 0.3–1.2)
Total Protein: 6.7 g/dL (ref 6.5–8.1)

## 2020-04-24 LAB — ECHOCARDIOGRAM COMPLETE
Area-P 1/2: 2.09 cm2
Calc EF: 37.6 %
S' Lateral: 5.1 cm
Single Plane A2C EF: 41.5 %
Single Plane A4C EF: 40.6 %

## 2020-04-24 LAB — DIGOXIN LEVEL: Digoxin Level: <0.2 ng/mL — ABNORMAL LOW (ref 0.8–2.0)

## 2020-04-24 LAB — BRAIN NATRIURETIC PEPTIDE: B Natriuretic Peptide: 111.9 pg/mL — ABNORMAL HIGH (ref 0.0–100.0)

## 2020-04-24 NOTE — Telephone Encounter (Signed)
Patient was seen in clinic and started on Farxiga today. Current 30 day co-pay is $70. Started an application for [employer] patient assistance.  Will fax in once all signatures are obtained.

## 2020-04-24 NOTE — Progress Notes (Signed)
  Echocardiogram 2D Echocardiogram has been performed.  Jennette Dubin 04/24/2020, 1:44 PM

## 2020-04-24 NOTE — Patient Instructions (Addendum)
Start Farxiga 10 mg Daily  Your provider has prescribed Wilder Glade for you. Please be aware the most common side effect of this medication is urinary tract infections and yeast infections. Please practice good hygiene and keep this area clean and dry to help prevent this. If you do begin to have symptoms of these infections, such as difficulty urinating or painful urination,  please let us know.  Labs done today, we will call for abnormal results  Your provider has recommended that  you wear a Zio Patch for 14 days.  This monitor will record your heart rhythm for our review.  IF you have any symptoms while wearing the monitor please press the button.  If you have any issues with the patch or you notice a red or orange light on it please call the company at [phone].  Once you remove the patch please mail it back to the company as soon as possible so we can get the results.  Your physician has requested that you have a cardiac MRI. Cardiac MRI uses a computer to create images of your heart as its beating, producing both still and moving pictures of your heart and major blood vessels. For further information please visit [email or web address] Please follow the instruction sheet given to you today for more information. ONCE APPROVED BY YOUR INSURANCE COMPANY WE WILL CALL YOU TO SCHEDULE THIS.  Your physician recommends that you schedule a follow-up appointment in: 2 months  If you have any questions or concerns before your next appointment please send [place] a message through [place] or call our office at [phone].    TO LEAVE A MESSAGE FOR THE NURSE SELECT OPTION 2, PLEASE LEAVE A MESSAGE INCLUDING: . YOUR NAME . DATE OF BIRTH . CALL BACK NUMBER . REASON FOR CALL**this is important as we prioritize the call backs  [place] AS LONG AS YOU CALL BEFORE 4:00 PM  At the [facility], you and your health needs are our priority. As part of our  continuing mission to provide you with exceptional heart care, we have created designated Provider Care Teams. These Care Teams include your primary Cardiologist (physician) and Advanced Practice Providers (APPs- Physician Assistants and Nurse Practitioners) who all work together to provide you with the care you need, when you need it.   You may see any of the following providers on your designated Care Team at your next follow up: [name] Dr Glori Bickers . Dr [name] . Darrick Grinder, NP . Lyda Jester, PA . Audry Riles, PharmD   Please be sure to bring in all your medications bottles to every appointment.

## 2020-04-25 ENCOUNTER — Telehealth (HOSPITAL_COMMUNITY): Payer: Self-pay | Admitting: Pharmacist

## 2020-04-25 NOTE — Telephone Encounter (Signed)
Sent in Manufacturer's Assistance application to [employer] for Farxiga.    Application pending, will continue to follow.   Keston Seever, PharmD, BCPS, BCCP, CPP Heart Failure Clinic Pharmacist [phone]  

## 2020-05-15 NOTE — Addendum Note (Signed)
Encounter addended by: Micki Riley, RN on: 05/15/2020 9:02 AM  Actions taken: Imaging Exam ended

## 2020-05-22 ENCOUNTER — Telehealth (HOSPITAL_COMMUNITY): Payer: Self-pay | Admitting: *Deleted

## 2020-05-22 DIAGNOSIS — I5022 Chronic systolic (congestive) heart failure: Secondary | ICD-10-CM

## 2020-05-22 NOTE — Telephone Encounter (Signed)
Pts wife left vm stating pts face is flushed, he has a fever, and diarrhea. Pts wife thinks pt is having a reaction from [place].   Routed to [place] please advise

## 2020-05-29 MED ORDER — DAPAGLIFLOZIN PROPANEDIOL 10 MG PO TABS: 10.0000 mg | ORAL_TABLET | Freq: Every day | ORAL | 6 refills | Status: DC

## 2020-05-29 NOTE — Telephone Encounter (Signed)
Called pt to f/u, he states he actually had a GI bug last week that was causing his symptoms, he is better now and has restarted his [place] with no problems.  He does c/o about having elevated BP, 160s/90s, he states he increased his Arlyce Harman himself several days ago, he was ordered 1/2 tab (12.5 mg) BID and he added an extra 1/4 of a tab to his AM dose, since doing that his BP has been much better running 115-130/70-80s, he would like to know if he can increase to adding a 1/4 of tab in PM dose as well. Advised pt Arlyce Harman was decreased to 1/2 tab BID due to hypokalemia so we need to be careful about increasing that medication. Advised I would send this information to Dr Haroldine Laws for his recommendations and call him back tomorrow, pt agreeable.

## 2020-05-30 MED ORDER — AMLODIPINE BESYLATE 5 MG PO TABS: 5.0000 mg | ORAL_TABLET | Freq: Every day | ORAL | 6 refills | Status: DC

## 2020-05-30 MED ORDER — SPIRONOLACTONE 25 MG PO TABS: 25.0000 mg | ORAL_TABLET | Freq: Every day | ORAL | 6 refills | Status: DC

## 2020-05-30 NOTE — Telephone Encounter (Signed)
Add amlodipine 5. Can take full dose spiro (25mg ).  Repeat BMET next week.

## 2020-05-30 NOTE — Telephone Encounter (Signed)
Spoke w/pt's wife, she is aware, agreeable, and verbalizes understanding. Pt will take Spiro 25 mg daily at night and Amlodipine 5 mg Daily in AM, new RX sent in, pt will come for repeat labs Tue 3/1

## 2020-06-04 ENCOUNTER — Telehealth (HOSPITAL_COMMUNITY): Payer: Self-pay | Admitting: Emergency Medicine

## 2020-06-04 NOTE — Telephone Encounter (Signed)
Reaching out to patient to offer assistance regarding upcoming cardiac imaging study; pt verbalizes understanding of appt date/time, parking situation and where to check in, and verified current allergies; name and call back number provided for further questions should they arise Roy Bond RN Navigator Cardiac Imaging [facility] [phone] office [phone] cell  Holding diuretics  Denies claustro, denies implants

## 2020-06-04 NOTE — Telephone Encounter (Signed)
Advanced Heart Failure Patient Advocate Encounter   Patient was approved to receive Farxiga from [employer]  Patient ID: [id number] Effective dates: 04/26/20 through 04/06/21  Patient already received one 90 day shipment of medication.  Charlann Boxer, CPhT

## 2020-06-05 ENCOUNTER — Ambulatory Visit (HOSPITAL_COMMUNITY)
Admission: RE | Admit: 2020-06-05 | Discharge: 2020-06-05 | Disposition: A | Discharge status: Home / Self Care | Payer: Medicare HMO | Source: Ambulatory Visit | Attending: Cardiology | Admitting: Cardiology

## 2020-06-05 ENCOUNTER — Other Ambulatory Visit: Payer: Self-pay

## 2020-06-05 ENCOUNTER — Ambulatory Visit (HOSPITAL_COMMUNITY)
Admission: RE | Admit: 2020-06-05 | Discharge: 2020-06-05 | Disposition: A | Discharge status: Home / Self Care | Payer: Medicare HMO | Source: Ambulatory Visit | Attending: Internal Medicine | Admitting: Internal Medicine

## 2020-06-05 DIAGNOSIS — I5022 Chronic systolic (congestive) heart failure: Secondary | ICD-10-CM | POA: Insufficient documentation

## 2020-06-05 LAB — BASIC METABOLIC PANEL
Anion gap: 10 (ref 5–15)
BUN: 14 mg/dL (ref 8–23)
CO2: 24 mmol/L (ref 22–32)
Calcium: 8.8 mg/dL — ABNORMAL LOW (ref 8.9–10.3)
Chloride: 103 mmol/L (ref 98–111)
Creatinine, Ser: 1.08 mg/dL (ref 0.61–1.24)
GFR, Estimated: >60 mL/min (ref >60)
Glucose, Bld: 88 mg/dL (ref 70–99)
Potassium: 4.8 mmol/L (ref 3.5–5.1)
Sodium: 137 mmol/L (ref 135–145)

## 2020-06-05 IMAGING — MR MR CARD MORPHOLOGY WO/W CM
44 of 48 series · 44 of 48 positions shown · IV contrast (gadavist)
Comparison: Cardiac MRI from [DATE]

CLINICAL DATA: 69-year-old male with h/o non-ischemic
cardiomyopathy.

EXAM:
CARDIAC MRI
TECHNIQUE: The patient was scanned on a 1.5 Tesla GE magnet. A dedicated
cardiac coil was used. Functional imaging was done using Fiesta
sequences. [DATE], and 4 chamber views were done to assess for RWMA's.
Modified MIRIELLO rule using a short axis stack was used to
calculate an ejection fraction on a dedicated work station using
Circle software. The patient received 9 cc of Gadavist. After 10
minutes inversion recovery sequences were used to assess for
infiltration and scar tissue.
CONTRAST:  9 cc  of Gadavist

[Series 4: [id number] · axial · 8.0mm · 1.41mm/px · 1 of 20 slices shown]
[im 1/20]
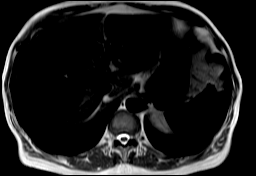

[Series 8: bSSFP · oblique · 8.0mm · 1.61mm/px · 1 of 25 slices shown (1 of 20)]
[im 1/25]
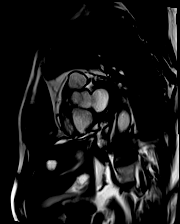

[Series 9: bSSFP · oblique · 8.0mm · 1.61mm/px · 1 of 25 slices shown (2 of 20)]
[im 1/25]
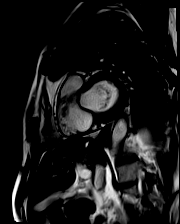

[Series 10: bSSFP · oblique · 8.0mm · 1.61mm/px · 1 of 25 slices shown (3 of 20)]
[im 1/25]
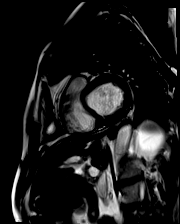

[Series 11: bSSFP · oblique · 8.0mm · 1.61mm/px · 1 of 25 slices shown (4 of 20)]
[im 1/25]
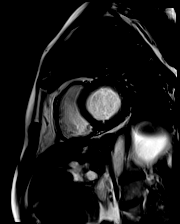

[Series 12: bSSFP · oblique · 8.0mm · 1.61mm/px · 1 of 25 slices shown (5 of 20)]
[im 1/25]
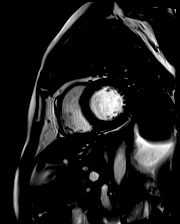

[Series 13: bSSFP · oblique · 8.0mm · 1.61mm/px · 1 of 25 slices shown (6 of 20)]
[im 1/25]
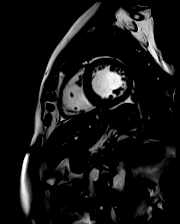

[Series 14: bSSFP · oblique · 8.0mm · 1.61mm/px · 1 of 25 slices shown (7 of 20)]
[im 1/25]
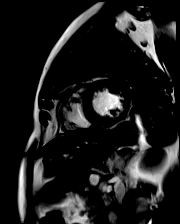

[Series 15: bSSFP · oblique · 8.0mm · 1.61mm/px · 1 of 25 slices shown (8 of 20)]
[im 1/25]
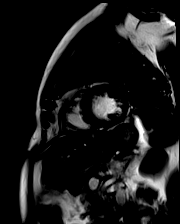

[Series 16: bSSFP · oblique · 8.0mm · 1.61mm/px · 1 of 25 slices shown (9 of 20)]
[im 1/25]
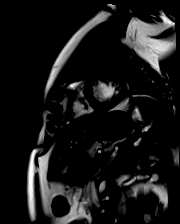

[Series 17: bSSFP · oblique · 8.0mm · 1.61mm/px · 1 of 25 slices shown (10 of 20)]
[im 1/25]
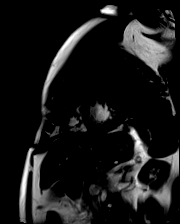

[Series 18: bSSFP · oblique · 8.0mm · 1.61mm/px · 1 of 25 slices shown (11 of 20)]
[im 1/25]
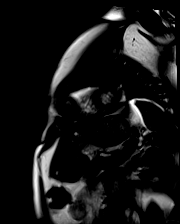

[Series 19: bSSFP · oblique · 8.0mm · 1.61mm/px · 1 of 25 slices shown (12 of 20)]
[im 1/25]
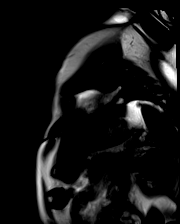

[Series 20: bSSFP · oblique · 8.0mm · 1.61mm/px · 1 of 25 slices shown (13 of 20)]
[im 1/25]
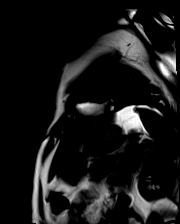

[Series 21: bSSFP · oblique · 8.0mm · 1.61mm/px · 1 of 25 slices shown (14 of 20)]
[im 1/25]
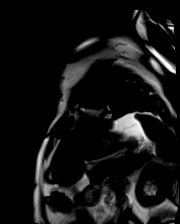

[Series 22: bSSFP · oblique · 8.0mm · 1.61mm/px · 1 of 25 slices shown (15 of 20)]
[im 1/25]
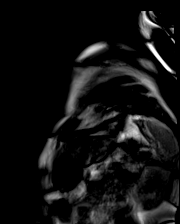

[Series 23: bSSFP · axial · 6.0mm · 1.41mm/px · 1 of 25 slices shown (16 of 20)]
[im 1/25]
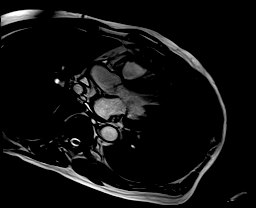

[Series 28: bSSFP · axial · 6.0mm · 1.41mm/px · 1 of 25 slices shown (17 of 20)]
[im 1/25]
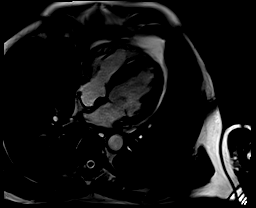

[Series 29: bSSFP · oblique · 6.0mm · 1.41mm/px · 1 of 25 slices shown (18 of 20)]
[im 1/25]
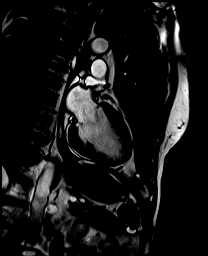

[Series 30: (id)_long_t1 · oblique · 8.0mm · 1.56mm/px · 1 of 24 slices shown]
[im 1/24]
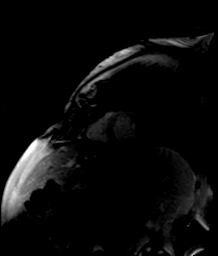

[Series 31: (id)_long_t1_moco · oblique · 8.0mm · 1.56mm/px · 1 of 24 slices shown]
[im 1/24]
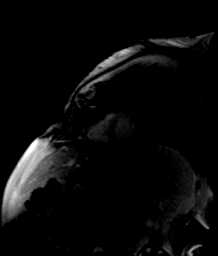

[Series 32: (id)_long_t1_moco_t1 · oblique · 8.0mm · 1.56mm/px · 1 of 6 slices shown]
[im 1/6]
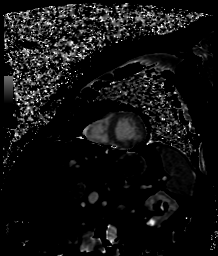

[Series 34: bSSFP · axial · 6.0mm · 1.41mm/px · 1 of 25 slices shown (19 of 20)]
[im 1/25]
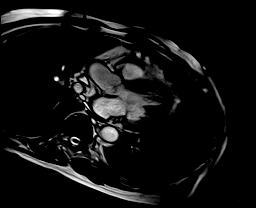

[Series 35: (id)_trufi · oblique · 8.0mm · 2.08mm/px · 1 of 9 slices shown]
[im 1/9]
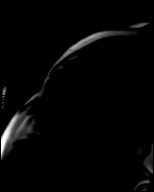

[Series 36: (id)_trufi_moco · oblique · 8.0mm · 2.08mm/px · 1 of 9 slices shown]
[im 1/9]
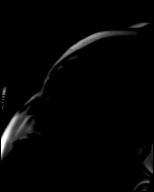

[Series 37: (id)_trufi_moco_t2 · oblique · 8.0mm · 2.08mm/px · 1 of 3 slices shown]
[im 1/3]
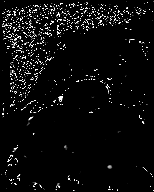

[Series 39: pre short axis · oblique · non-contrast · 8.0mm · 2.25mm/px · 1 of 10 slices shown (1 of 6)]
[im 1/10]
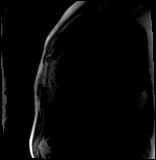

[Series 40: pre short axis · oblique · non-contrast · 8.0mm · 2.25mm/px · 1 of 10 slices shown (2 of 6)]
[im 1/10]
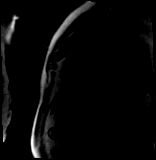

[Series 41: pre short axis · oblique · non-contrast · 8.0mm · 2.25mm/px · 1 of 10 slices shown (3 of 6)]
[im 1/10]
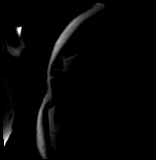

[Series 42: pre short axis · oblique · non-contrast · 8.0mm · 2.25mm/px · 1 of 10 slices shown (4 of 6)]
[im 1/10]
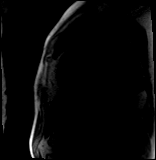

[Series 43: pre short axis · oblique · non-contrast · 8.0mm · 2.25mm/px · 1 of 10 slices shown (5 of 6)]
[im 1/10]
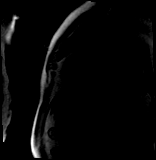

[Series 44: pre short axis · oblique · non-contrast · 8.0mm · 2.25mm/px · 1 of 10 slices shown (6 of 6)]
[im 1/10]
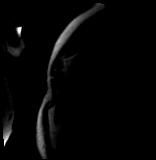

[Series 45: rest short axis · oblique · 8.0mm · 2.25mm/px · 1 of 60 slices shown (1 of 6)]
[im 1/60]
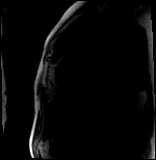

[Series 46: rest short axis · oblique · 8.0mm · 2.25mm/px · 1 of 60 slices shown (2 of 6)]
[im 1/60]
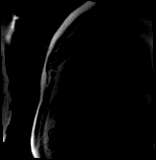

[Series 47: rest short axis · oblique · 8.0mm · 2.25mm/px · 1 of 60 slices shown (3 of 6)]
[im 1/60]
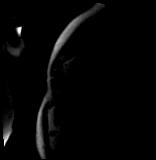

[Series 48: rest short axis · oblique · 8.0mm · 2.25mm/px · 1 of 60 slices shown (4 of 6)]
[im 1/60]
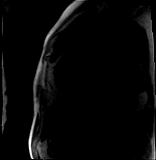

[Series 49: rest short axis · oblique · 8.0mm · 2.25mm/px · 1 of 60 slices shown (5 of 6)]
[im 1/60]
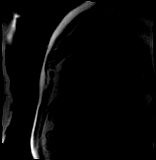

[Series 50: rest short axis · oblique · 8.0mm · 2.25mm/px · 1 of 60 slices shown (6 of 6)]
[im 1/60]
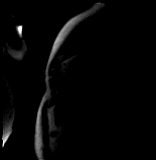

[Series 51: bSSFP · coronal · 6.0mm · 1.41mm/px · 1 of 25 slices shown (20 of 20)]
[im 1/25]
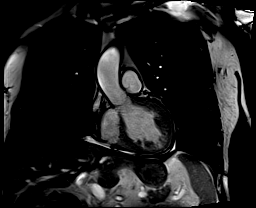

[Series 52: cine rvit · coronal · 6.0mm · 1.41mm/px · 1 of 25 slices shown]
[im 1/25]
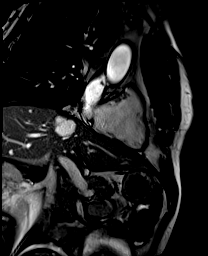

[Series 53: aortic valve cine · oblique · 6.0mm · 1.41mm/px · 1 of 25 slices shown]
[im 1/25]
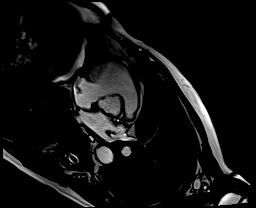

[Series 54: cine rvot · sagittal · 6.0mm · 1.41mm/px · 1 of 25 slices shown]
[im 1/25]
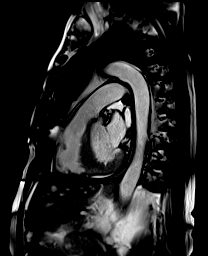

[Series 56: lge_single shot sa · oblique · 8.0mm · 2.08mm/px · 1 of 16 slices shown (1 of 2)]
[im 1/16]
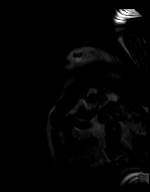

[Series 57: lge_single shot sa · oblique · 8.0mm · 2.08mm/px · 1 of 16 slices shown (2 of 2)]
[im 1/16]
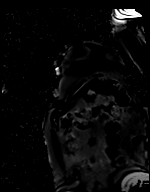

[44 of 48 positions shown; findings below may reference images not displayed]

FINDINGS: 1. Mildly dilated left ventricle with mild concentric hypertrophy
and moderately decreased systolic function (LVEF = 40%). There is
diffuse hypokinesis. ECV = 38%.

There is midwall late gadolinium enhancement in the basal anterior
and anteroseptal walls.

LVEDD: 58 mm

LVESD: 51 mm

LVEDV:  170 ml (previously 285 ml)

LVESV: 103 ml

SV: 67 ml

CO: 4.3 L/min

Myocardial mass: 147 g

2. Normal right ventricular size, thickness and mildly decreased
systolic function (RVEF = 43%). There are no regional wall motion
abnormalities.

RVEDV: 157 ml (previously 288 ml)

RVESV: 90 ml

SV: 67 ml

3.  Normal right atrial size.  Mildly dilated left atrium.

4. Normal size of the aortic root, ascending aorta and pulmonary
artery.

5. Trivial mitral regurgitation, otherwise no significant valvular
abnormalities.

6.  Normal pericardium.  No pericardial effusion.
IMPRESSION: 1. Mildly dilated left ventricle with mild concentric hypertrophy
and moderately decreased systolic function (LVEF = 40%). There is
diffuse hypokinesis. ECV = 38%.

There is midwall late gadolinium enhancement in the basal anterior
and anteroseptal walls,

2. Normal right ventricular size, thickness and mildly decreased
systolic function (RVEF = 43%). There are no regional wall motion
abnormalities.

3.  Normal right atrial size.  Mildly dilated left atrium.

4. Normal size of the aortic root, ascending aorta and pulmonary
artery.

5. Trivial mitral regurgitation, otherwise no significant valvular
abnormalities.

6.  Normal pericardium.  No pericardial effusion.

When compared to the prior study from [DATE], there is decrease
in size in both ventricles as well as improvement in biventricular
systolic function. ECV is 38%. There are 2 segments of midwall
nonspecific LGE that might be associated with idiopathic
non-ischemic cardiomyopathy, however cardiac sarcoidosis should be
considered.

## 2020-06-05 MED ORDER — GADOBUTROL 1 MMOL/ML IV SOLN
10.0000 mL | Freq: Once | INTRAVENOUS | Status: AC | PRN
  Administered 2020-06-05: 10 mL via INTRAVENOUS

## 2020-06-11 ENCOUNTER — Telehealth (HOSPITAL_COMMUNITY): Payer: Self-pay | Admitting: Pharmacy Technician

## 2020-06-11 NOTE — Telephone Encounter (Signed)
Confirmed that patient received his first shipment of Farxiga from [place]. Next 90 day shipment is scheduled for 04/18.  Charlann Boxer, CPhT

## 2020-06-17 ENCOUNTER — Other Ambulatory Visit (HOSPITAL_COMMUNITY): Payer: Self-pay | Admitting: Internal Medicine

## 2020-06-24 NOTE — Progress Notes (Signed)
[facility] Note   Date:  06/25/2020   ID:  Roy Duke, DOB Aug 04, 1950, MRN [id number]  Location: Home  Provider location: [facility] Type of Visit: Established patient  PCP:  [name], MD  Cardiologist:  No primary care provider on file. Primary HF: [place]  Chief Complaint: Heart Failure follow-up   History of Present Illness:  Roy Duke is a 70 y/o male with HTN, HL, DM2, former tobacco, ETOH use (quit 30 years ago) and systolic HF due to NICM.  Admitted 12/20 to [facility] with severe hyponatremia and HF.  Echo EF 25%. Developed NSVT with polymorphic ectopy. ECG with RBBB and new lateral TWI. He was transferred to [facility]  LHC w/ mild, nonobstructive CAD. Well compensated hemodynamics on RHC. cMRi EF 28% w/ nonspecific inferoseptal RV insertion site LGE. ECV percentage elevated at 39%. 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  He was started on amiodarone for suppression of PVCs and fitted w/ a LifeVest prior to discharge.   Sleep study 3/21 AHI 2.3   Echo 5/21: EF 40-45%  Echo 1/22 EF 35-40% RV mildly HK. Personally reviewed   cMRI 3/22 - repeated due to persistent LV dysfunction despite PVC suppression 1. LVEF 40% ECV = 38%.Midwall LGE in the basal anterior and anteroseptal walls, 2. RVEF 43%  3. When compared to the prior study from 05/29/2019, there is decrease in size in both ventricles as well as improvement in biventricular systolic function. ECV is 38%. There are 2 segments of midwall nonspecific LGE that might be associated with idiopathic NICM. Consider sarcoid   Here for f/u with his wife. Says he feels great. Now back to work at [employer]. One day last week walked 6 miles. No CP. Occasional SOB. No edema orthopnea or PND. Takes torsemide rarely.    Cardiac Studies  2D echo 05/2019 Left ventricular ejection fraction, by estimation, is 20 to 25%. The left ventricle has  severely decreased function. The left ventricle demonstrates global hypokinesis. The left ventricular internal cavity size was moderately dilated. Left ventricular diastolic parameters are indeterminate. 2. Right ventricular systolic function is mildly reduced. The right ventricular size is normal. There is normal pulmonary artery systolic pressure. The estimated right ventricular systolic pressure is 14.9 mmHg. 3. Left atrial size was moderately dilated. 4. The mitral valve is normal in structure and function. Mild mitral valve regurgitation. No evidence of mitral stenosis. 5. The aortic valve is tricuspid. Aortic valve regurgitation is not visualized. No aortic stenosis is present. 6. The inferior vena cava is normal in size with greater than 50% respiratory variability, suggesting right atrial pressure of 3 mmHg.   [facility] 05/26/19 Ost LAD to Prox LAD lesion is 20% stenosed.  Findings:  Ao = 100/72 (86) LV = 103/14 RA = 2 RV = 27/4 PA = 32/12 (22) PCW = 14 Fick cardiac output/index = 6.5/3.3 PVR = 1.2 WU FA sat = 99% PA sat = 75%, 76%  Assessment: 1. Minimal CAD  2. Severe NICM EF 15% 3. Well-compensated hemodynamics  cMRi 05/26/19 IMPRESSION: 1. Severely dilated LV with EF 18%, diffuse hypokinesis.  2. Moderately dilated RV with EF 28%.  3. Nonspecific inferoseptal RV insertion site LGE, can be suggestive of pressure overload/CHF.  4. ECV percentage elevated at 39%, can see with increased fibrosis with cardiomyopathy or myocarditis. Generally amyloidosis tends to be >40%.       Past Medical History:  Diagnosis  Date  . CHF (congestive heart failure) ([place])   . COVID    2 weeks ago  . Diabetes mellitus without complication ([place])   . Hypertension    Past Surgical History:  Procedure Laterality Date  . RIGHT/LEFT HEART CATH AND CORONARY ANGIOGRAPHY N/A 05/26/2019   Procedure: RIGHT/LEFT HEART CATH AND CORONARY ANGIOGRAPHY;  Surgeon: [name], MD;  Location: [facility];  Service: Cardiovascular;  Laterality: N/A;     Current Outpatient Medications  Medication Sig Dispense Refill  . albuterol (ACCUNEB) 0.63 MG/3ML nebulizer solution Take 1 ampule by nebulization every 4 (four) hours as needed for shortness of breath.     [name] albuterol (VENTOLIN HFA) 108 (90 Base) MCG/ACT inhaler Inhale 2 puffs into the lungs every 4 (four) hours as needed for wheezing.    [name] amiodarone (PACERONE) 200 MG tablet Take 1 tablet by mouth once daily 30 tablet 5  . amLODipine (NORVASC) 5 MG tablet Take 1 tablet (5 mg total) by mouth daily. 30 tablet 6  . aspirin EC 81 MG EC tablet Take 1 tablet (81 mg total) by mouth daily. 90 tablet 0  . atorvastatin (LIPITOR) 40 MG tablet Take 40 mg by mouth daily at 6 PM.     . carvedilol (COREG) 6.25 MG tablet Take 1 tablet (6.25 mg total) by mouth 2 (two) times daily. 180 tablet 3  . dapagliflozin propanediol (FARXIGA) 10 MG TABS tablet Take 1 tablet (10 mg total) by mouth daily before breakfast. 30 tablet 6  . latanoprost (XALATAN) 0.005 % ophthalmic solution Place 1 drop into both eyes at bedtime.    . metFORMIN (GLUCOPHAGE) 500 MG tablet Take 500 mg by mouth 2 (two) times daily with a meal.     . montelukast (SINGULAIR) 10 MG tablet Take 10 mg by mouth daily.    [name] omeprazole (PRILOSEC) 40 MG capsule Take 40 mg by mouth daily.    . sacubitril-valsartan (ENTRESTO) 97-103 MG Take 1 tablet by mouth 2 (two) times daily. 60 tablet 11  . spironolactone (ALDACTONE) 25 MG tablet Take 1 tablet (25 mg total) by mouth at bedtime. 30 tablet 6  . torsemide (DEMADEX) 20 MG tablet Take 1 tablet (20 mg total) by mouth daily as needed (for weight gain). 30 tablet 1  . traMADol (ULTRAM-ER) 100 MG 24 hr tablet Take 100 mg by mouth daily as needed for pain.     No current facility-administered medications for this encounter.    Allergies:   Benadryl [diphenhydramine], Codeine, and Penicillins   Social History:  The  patient  reports that he quit smoking about 31 years ago. His smoking use included cigarettes. He has never used smokeless tobacco. He reports previous alcohol use. He reports previous drug use.   Family History:  The patient's family history includes CAD in his paternal uncle; Heart attack in his paternal grandfather; Heart disease in his father.   ROS:  Please see the history of present illness.   All other systems are personally reviewed and negative.   Vitals:   06/25/20 1345  BP: 130/70  Pulse: 72  SpO2: 97%  Weight: 77.6 kg (171 lb)    Exam:   General:  Well appearing. No resp difficulty HEENT: normal Neck: supple. no JVD. Carotids 2+ bilat; no bruits. No lymphadenopathy or thryomegaly appreciated. Cor: PMI nondisplaced. Regular rate & rhythm. No rubs, gallops or murmurs. Lungs: clear Abdomen: soft, nontender, nondistended. No hepatosplenomegaly. No bruits or masses. Good bowel sounds. Extremities: no  cyanosis, clubbing, rash, edema Neuro: alert & orientedx3, cranial nerves grossly intact. moves all 4 extremities w/o difficulty. Affect pleasant    Recent Labs: 08/23/2019: TSH 4.586 12/15/2019: Hemoglobin 11.8; Platelets 221 04/24/2020: ALT 29; B Natriuretic Peptide 111.9 06/05/2020: BUN 14; Creatinine, Ser 1.08; Potassium 4.8; Sodium 137  Personally reviewed   Wt Readings from Last 3 Encounters:  06/25/20 77.6 kg (171 lb)  04/24/20 80.4 kg (177 lb 3.2 oz)  12/15/19 77.6 kg (171 lb)      ASSESSMENT AND PLAN:  1.Chronic Systolic Heart Failure (NICM) -Echo @ [facility] 2/21 EF of 25 to 30% with severe global hypokinesis. RV also noted to be moderately enlarged with moderately impaired RV systolic function. -LHC 05/26/19 w/ mild, nonobstructive CAD. RHC w/ compensated hemodynamics  - cMRI 2/21 EF 18% w/ nonspecific inferoseptal RV insertion site LGE. ECV percentage elevated at 39%, can see w/ increased fibrosis with cardiomyopathy or myocarditis. Suspect PVC also contributing.  PVCs now suppressed with amio.  - Echo 08/29/19 EF 40-45% (improved with PVC suppression) - Echo 04/24/20 EF 35-40% RV mildly HK. - cMRI 3/22 LVEF 40% RVEF 43% mild LGE ECV 38%  - Stable NYHA II Volume status ok Continue PRN torsemide.  - Continue amiodarone 200 mg once daily  - Continue carvedilol 6.25 mg bid - Continue Entresto 97/103 bid -Continue spironolactone25 mg daily.  - Continue Farxiga 10 - Based on improvement in cMRI, I suspect main issue is resolving myocarditis. Doubt sarcoid as EF unliekly to get better without steroid treatment  2. PVCs - Zio 2/21 burden ~8-10% - PVCs suppressed with amio but EF has not normalized - Sleep study negative - Zio 2/22 Rare PVCs (<1%) - Will continue amio for another 6 months then stop. Suspect PVCs may quiet down with resolution of myocarditis. If recur can use mexilitene.   3.Hypertension - Blood pressure well controlled. Continue current regimen.  4.Hyperlipidemia - Continue Lipitor 40mg  qhs. General Cardiology to continue to follow   5.Type 2 Diabetes Mellitus -Hgb A1C 7.9  - Continue Metformin & Farxiga   6. Bronchitis +/- COPD - PFTs 9/21 Mild obstruction FEV1 2.55L (77%)  Signed, Glori Bickers, MD  06/25/2020 2:18 PM  [facility] [place] [address] [phone] (office) [phone] (fax)

## 2020-06-25 ENCOUNTER — Encounter (HOSPITAL_COMMUNITY): Payer: Self-pay | Admitting: Internal Medicine

## 2020-06-25 ENCOUNTER — Ambulatory Visit (HOSPITAL_COMMUNITY)
Admission: RE | Admit: 2020-06-25 | Discharge: 2020-06-25 | Disposition: A | Discharge status: Home / Self Care | Payer: Medicare HMO | Source: Ambulatory Visit | Attending: Internal Medicine | Admitting: Internal Medicine

## 2020-06-25 ENCOUNTER — Other Ambulatory Visit: Payer: Self-pay

## 2020-06-25 VITALS — BP 130/70 | HR 72 | Wt 171.0 lb

## 2020-06-25 DIAGNOSIS — J42 Unspecified chronic bronchitis: Secondary | ICD-10-CM | POA: Insufficient documentation

## 2020-06-25 DIAGNOSIS — Z885 Allergy status to narcotic agent status: Secondary | ICD-10-CM | POA: Insufficient documentation

## 2020-06-25 DIAGNOSIS — Z880 Allergy status to penicillin: Secondary | ICD-10-CM | POA: Diagnosis not present

## 2020-06-25 DIAGNOSIS — I5022 Chronic systolic (congestive) heart failure: Secondary | ICD-10-CM | POA: Diagnosis not present

## 2020-06-25 DIAGNOSIS — Z79899 Other long term (current) drug therapy: Secondary | ICD-10-CM | POA: Insufficient documentation

## 2020-06-25 DIAGNOSIS — I493 Ventricular premature depolarization: Secondary | ICD-10-CM | POA: Diagnosis not present

## 2020-06-25 DIAGNOSIS — E785 Hyperlipidemia, unspecified: Secondary | ICD-10-CM | POA: Insufficient documentation

## 2020-06-25 DIAGNOSIS — Z8616 Personal history of COVID-19: Secondary | ICD-10-CM | POA: Insufficient documentation

## 2020-06-25 DIAGNOSIS — Z7982 Long term (current) use of aspirin: Secondary | ICD-10-CM | POA: Insufficient documentation

## 2020-06-25 DIAGNOSIS — I4510 Unspecified right bundle-branch block: Secondary | ICD-10-CM | POA: Diagnosis not present

## 2020-06-25 DIAGNOSIS — Z7984 Long term (current) use of oral hypoglycemic drugs: Secondary | ICD-10-CM | POA: Insufficient documentation

## 2020-06-25 DIAGNOSIS — Z8249 Family history of ischemic heart disease and other diseases of the circulatory system: Secondary | ICD-10-CM | POA: Diagnosis not present

## 2020-06-25 DIAGNOSIS — I428 Other cardiomyopathies: Secondary | ICD-10-CM | POA: Diagnosis not present

## 2020-06-25 DIAGNOSIS — Z888 Allergy status to other drugs, medicaments and biological substances status: Secondary | ICD-10-CM | POA: Diagnosis not present

## 2020-06-25 DIAGNOSIS — Z87891 Personal history of nicotine dependence: Secondary | ICD-10-CM | POA: Diagnosis not present

## 2020-06-25 DIAGNOSIS — I110 Hypertensive heart disease with heart failure: Secondary | ICD-10-CM | POA: Insufficient documentation

## 2020-06-25 DIAGNOSIS — I10 Essential (primary) hypertension: Secondary | ICD-10-CM | POA: Diagnosis not present

## 2020-06-25 DIAGNOSIS — E119 Type 2 diabetes mellitus without complications: Secondary | ICD-10-CM | POA: Insufficient documentation

## 2020-06-25 DIAGNOSIS — I2510 Atherosclerotic heart disease of native coronary artery without angina pectoris: Secondary | ICD-10-CM | POA: Insufficient documentation

## 2020-06-25 LAB — BASIC METABOLIC PANEL
Anion gap: 7 (ref 5–15)
BUN: 12 mg/dL (ref 8–23)
CO2: 24 mmol/L (ref 22–32)
Calcium: 9.2 mg/dL (ref 8.9–10.3)
Chloride: 104 mmol/L (ref 98–111)
Creatinine, Ser: 0.99 mg/dL (ref 0.61–1.24)
GFR, Estimated: >60 mL/min (ref >60)
Glucose, Bld: 87 mg/dL (ref 70–99)
Potassium: 4.6 mmol/L (ref 3.5–5.1)
Sodium: 135 mmol/L (ref 135–145)

## 2020-06-25 LAB — BRAIN NATRIURETIC PEPTIDE: B Natriuretic Peptide: 19.8 pg/mL (ref 0.0–100.0)

## 2020-06-25 NOTE — Addendum Note (Signed)
Encounter addended by: Scarlette Calico, RN on: 06/25/2020 2:33 PM  Actions taken: Order list changed, Diagnosis association updated, Clinical Note Signed, Charge Capture section accepted

## 2020-06-25 NOTE — Patient Instructions (Signed)
Labs done today, your results will be available in MyChart, we will contact you for abnormal readings.  Please call our office in August to schedule your echocardiogram and follow up appointment  If you have any questions or concerns before your next appointment please send [place] a message through [place] or call our office at [phone].    TO LEAVE A MESSAGE FOR THE NURSE SELECT OPTION 2, PLEASE LEAVE A MESSAGE INCLUDING: . YOUR NAME . DATE OF BIRTH . CALL BACK NUMBER . REASON FOR CALL**this is important as we prioritize the call backs  [place] AS LONG AS YOU CALL BEFORE 4:00 PM  At the [facility], you and your health needs are our priority. As part of our continuing mission to provide you with exceptional heart care, we have created designated Provider Care Teams. These Care Teams include your primary Cardiologist (physician) and Advanced Practice Providers (APPs- Physician Assistants and Nurse Practitioners) who all work together to provide you with the care you need, when you need it.   You may see any of the following providers on your designated Care Team at your next follow up: [name] Dr Glori Bickers . Dr [name] . Dr Vickki Muff . Darrick Grinder, NP . Lyda Jester, [place] . Audry Riles, PharmD   Please be sure to bring in all your medications bottles to every appointment.

## 2020-10-07 ENCOUNTER — Other Ambulatory Visit (HOSPITAL_COMMUNITY): Payer: Self-pay | Admitting: Internal Medicine

## 2020-10-09 ENCOUNTER — Other Ambulatory Visit: Payer: Self-pay

## 2020-10-09 MED ORDER — SACUBITRIL-VALSARTAN 97-103 MG PO TABS: 1.0000 | ORAL_TABLET | Freq: Two times a day (BID) | ORAL | 5 refills | Status: DC

## 2020-10-09 NOTE — Telephone Encounter (Signed)
This is a CHF pt, Dr. Bensimhon 

## 2020-12-24 ENCOUNTER — Other Ambulatory Visit (HOSPITAL_COMMUNITY): Payer: Self-pay | Admitting: Internal Medicine

## 2020-12-28 ENCOUNTER — Encounter (HOSPITAL_COMMUNITY): Payer: Self-pay | Admitting: Internal Medicine

## 2020-12-28 ENCOUNTER — Ambulatory Visit (HOSPITAL_COMMUNITY)
Admission: RE | Admit: 2020-12-28 | Discharge: 2020-12-28 | Disposition: A | Discharge status: Home / Self Care | Payer: Medicare HMO | Source: Ambulatory Visit | Attending: Internal Medicine | Admitting: Internal Medicine

## 2020-12-28 ENCOUNTER — Ambulatory Visit (HOSPITAL_BASED_OUTPATIENT_CLINIC_OR_DEPARTMENT_OTHER)
Admission: RE | Admit: 2020-12-28 | Discharge: 2020-12-28 | Disposition: A | Discharge status: Home / Self Care | Payer: Medicare HMO | Source: Ambulatory Visit | Attending: Internal Medicine | Admitting: Internal Medicine

## 2020-12-28 ENCOUNTER — Other Ambulatory Visit: Payer: Self-pay

## 2020-12-28 VITALS — BP 146/80 | HR 66 | Wt 170.8 lb

## 2020-12-28 DIAGNOSIS — E785 Hyperlipidemia, unspecified: Secondary | ICD-10-CM | POA: Insufficient documentation

## 2020-12-28 DIAGNOSIS — Z09 Encounter for follow-up examination after completed treatment for conditions other than malignant neoplasm: Secondary | ICD-10-CM | POA: Diagnosis not present

## 2020-12-28 DIAGNOSIS — Z87891 Personal history of nicotine dependence: Secondary | ICD-10-CM | POA: Insufficient documentation

## 2020-12-28 DIAGNOSIS — Z7901 Long term (current) use of anticoagulants: Secondary | ICD-10-CM | POA: Diagnosis not present

## 2020-12-28 DIAGNOSIS — E119 Type 2 diabetes mellitus without complications: Secondary | ICD-10-CM | POA: Diagnosis not present

## 2020-12-28 DIAGNOSIS — I493 Ventricular premature depolarization: Secondary | ICD-10-CM | POA: Diagnosis not present

## 2020-12-28 DIAGNOSIS — I110 Hypertensive heart disease with heart failure: Secondary | ICD-10-CM | POA: Diagnosis not present

## 2020-12-28 DIAGNOSIS — I5022 Chronic systolic (congestive) heart failure: Secondary | ICD-10-CM | POA: Insufficient documentation

## 2020-12-28 DIAGNOSIS — Z79899 Other long term (current) drug therapy: Secondary | ICD-10-CM | POA: Diagnosis not present

## 2020-12-28 DIAGNOSIS — Z8616 Personal history of COVID-19: Secondary | ICD-10-CM | POA: Insufficient documentation

## 2020-12-28 DIAGNOSIS — E7800 Pure hypercholesterolemia, unspecified: Secondary | ICD-10-CM | POA: Diagnosis not present

## 2020-12-28 DIAGNOSIS — E859 Amyloidosis, unspecified: Secondary | ICD-10-CM | POA: Diagnosis not present

## 2020-12-28 DIAGNOSIS — I10 Essential (primary) hypertension: Secondary | ICD-10-CM | POA: Diagnosis not present

## 2020-12-28 DIAGNOSIS — Z7982 Long term (current) use of aspirin: Secondary | ICD-10-CM | POA: Diagnosis not present

## 2020-12-28 DIAGNOSIS — Z8249 Family history of ischemic heart disease and other diseases of the circulatory system: Secondary | ICD-10-CM | POA: Diagnosis not present

## 2020-12-28 DIAGNOSIS — I351 Nonrheumatic aortic (valve) insufficiency: Secondary | ICD-10-CM | POA: Diagnosis not present

## 2020-12-28 DIAGNOSIS — I4510 Unspecified right bundle-branch block: Secondary | ICD-10-CM | POA: Insufficient documentation

## 2020-12-28 DIAGNOSIS — I2510 Atherosclerotic heart disease of native coronary artery without angina pectoris: Secondary | ICD-10-CM | POA: Diagnosis not present

## 2020-12-28 DIAGNOSIS — I428 Other cardiomyopathies: Secondary | ICD-10-CM | POA: Diagnosis not present

## 2020-12-28 DIAGNOSIS — Z7984 Long term (current) use of oral hypoglycemic drugs: Secondary | ICD-10-CM | POA: Insufficient documentation

## 2020-12-28 DIAGNOSIS — J449 Chronic obstructive pulmonary disease, unspecified: Secondary | ICD-10-CM | POA: Insufficient documentation

## 2020-12-28 LAB — HEMOGLOBIN A1C
Hgb A1c MFr Bld: 7 % — ABNORMAL HIGH (ref 4.8–5.6)
Mean Plasma Glucose: 154.2 mg/dL

## 2020-12-28 LAB — COMPREHENSIVE METABOLIC PANEL
ALT: 27 U/L (ref 0–44)
AST: 28 U/L (ref 15–41)
Albumin: 3.9 g/dL (ref 3.5–5.0)
Alkaline Phosphatase: 57 U/L (ref 38–126)
Anion gap: 8 (ref 5–15)
BUN: 13 mg/dL (ref 8–23)
CO2: 24 mmol/L (ref 22–32)
Calcium: 8.9 mg/dL (ref 8.9–10.3)
Chloride: 105 mmol/L (ref 98–111)
Creatinine, Ser: 0.99 mg/dL (ref 0.61–1.24)
GFR, Estimated: >60 mL/min (ref >60)
Glucose, Bld: 105 mg/dL — ABNORMAL HIGH (ref 70–99)
Potassium: 4.5 mmol/L (ref 3.5–5.1)
Sodium: 137 mmol/L (ref 135–145)
Total Bilirubin: 0.6 mg/dL (ref 0.3–1.2)
Total Protein: 7 g/dL (ref 6.5–8.1)

## 2020-12-28 LAB — CBC
HCT: 41 % (ref 39.0–52.0)
Hemoglobin: 13.3 g/dL (ref 13.0–17.0)
MCH: 28.7 pg (ref 26.0–34.0)
MCHC: 32.4 g/dL (ref 30.0–36.0)
MCV: 88.6 fL (ref 80.0–100.0)
Platelets: 159 10*3/uL (ref 150–400)
RBC: 4.63 MIL/uL (ref 4.22–5.81)
RDW: 13.8 % (ref 11.5–15.5)
WBC: 4.6 10*3/uL (ref 4.0–10.5)
nRBC: 0 % (ref 0.0–0.2)

## 2020-12-28 LAB — ECHOCARDIOGRAM COMPLETE
Area-P 1/2: 3.17 cm2
Calc EF: 55 %
S' Lateral: 4.1 cm
Single Plane A2C EF: 55.5 %
Single Plane A4C EF: 49.3 %

## 2020-12-28 LAB — LIPID PANEL
Cholesterol: 176 mg/dL (ref 0–200)
HDL: 83 mg/dL (ref >40)
LDL Cholesterol: 74 mg/dL (ref 0–99)
Total CHOL/HDL Ratio: 2.1 RATIO
Triglycerides: 96 mg/dL (ref <150)
VLDL: 19 mg/dL (ref 0–40)

## 2020-12-28 NOTE — Addendum Note (Signed)
Encounter addended by: Scarlette Calico, RN on: 12/28/2020 11:34 AM  Actions taken: Visit diagnoses modified, Diagnosis association updated, Order list changed, Medication long-term status modified, Clinical Note Signed, Charge Capture section accepted

## 2020-12-28 NOTE — Patient Instructions (Addendum)
Stop Amiodarone  Labs done today, we will call you with abnormal results  Your physician recommends that you schedule a follow-up appointment in: 6 months (March, 2023) **PLEASE CALL OUR OFFICE IN February TO SCHEDULE THIS  If you have any questions or concerns before your next appointment please send [place] a message through [place] or call our office at [phone].    TO LEAVE A MESSAGE FOR THE NURSE SELECT OPTION 2, PLEASE LEAVE A MESSAGE INCLUDING: YOUR NAME DATE OF BIRTH CALL BACK NUMBER REASON FOR CALL**this is important as we prioritize the call backs  YOU WILL RECEIVE A CALL BACK THE SAME DAY AS LONG AS YOU CALL BEFORE 4:00 PM  At the [facility], you and your health needs are our priority. As part of our continuing mission to provide you with exceptional heart care, we have created designated Provider Care Teams. These Care Teams include your primary Cardiologist (physician) and Advanced Practice Providers (APPs- Physician Assistants and Nurse Practitioners) who all work together to provide you with the care you need, when you need it.   You may see any of the following providers on your designated Care Team at your next follow up: Dr Glori Bickers Dr [name] Dr Patrice Paradise, NP Lyda Jester, [place] Ginnie Smart Audry Riles, PharmD   Please be sure to bring in all your medications bottles to every appointment.

## 2020-12-28 NOTE — Progress Notes (Signed)
[facility] Clinic Note   Date:  12/28/2020   ID:  Roy Duke, DOB 1950/09/20, MRN [id number]  Location: Home  Provider location: [place] [facility] Type of Visit: Established patient  PCP:  [name], MD  Cardiologist:  None Primary HF: [name]  Chief Complaint: Heart Failure follow-up   History of Present Illness:  Roy Duke is a 70 y/o male with HTN, HL, DM2, former tobacco, ETOH use (quit 30 years ago) and systolic HF due to NICM.  Admitted 12/20 to [facility] with severe hyponatremia and HF.  Echo EF 25%. Developed NSVT with polymorphic ectopy. ECG with RBBB and new lateral TWI. He was transferred to [facility].   LHC w/ mild, nonobstructive CAD. Well compensated hemodynamics on RHC. cMRi EF 28% w/ nonspecific inferoseptal RV insertion site LGE. ECV percentage elevated at 39%. 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  He was started on amiodarone for suppression of PVCs and fitted w/ a LifeVest prior to discharge.    Sleep study 3/21 AHI 2.3   Echo 5/21: EF 40-45%  Echo 1/22 EF 35-40% RV mildly HK. Personally reviewed   cMRI 3/22 - repeated due to persistent LV dysfunction despite PVC suppression 1. LVEF 40% ECV = 38%.Midwall LGE in the basal anterior and anteroseptal walls, 2. RVEF 43%  3. When compared to the prior study from 05/29/2019, there is decrease in size in both ventricles as well as improvement in biventricular systolic function. ECV is 38%. There are 2 segments of midwall nonspecific LGE that might be associated with idiopathic NICM. Consider sarcoid   Here for f/u with his wife. Says he feels great. Now back to work at [employer]. Working over 50 hours a week. Gets tired at the end of the day. No CP, orthopnea or PND. Not taking lasix.   Echo today (12/28/20): EF 50-55% Personally reviewed    Cardiac Studies   2D echo 05/2019 Left ventricular ejection fraction, by estimation, is 20 to 25%.  The left ventricle has severely decreased function. The left ventricle demonstrates global hypokinesis. The left ventricular internal cavity size was moderately dilated. Left ventricular diastolic parameters are indeterminate. 2. Right ventricular systolic function is mildly reduced. The right ventricular size is normal. There is normal pulmonary artery systolic pressure. The estimated right ventricular systolic pressure is 00.9 mmHg. 3. Left atrial size was moderately dilated. 4. The mitral valve is normal in structure and function. Mild mitral valve regurgitation. No evidence of mitral stenosis. 5. The aortic valve is tricuspid. Aortic valve regurgitation is not visualized. No aortic stenosis is present. 6. The inferior vena cava is normal in size with greater than 50% respiratory variability, suggesting right atrial pressure of 3 mmHg.     [facility] 05/26/19 Ost LAD to Prox LAD lesion is 20% stenosed.   Findings:   Ao = 100/72 (86) LV = 103/14 RA =  2 RV = 27/4 PA = 32/12 (22) PCW = 14 Fick cardiac output/index = 6.5/3.3 PVR = 1.2 WU FA sat = 99% PA sat = 75%, 76%   Assessment: 1. Minimal CAD  2. Severe NICM EF 15% 3. Well-compensated hemodynamics   cMRi 05/26/19 IMPRESSION: 1.  Severely dilated LV with EF 18%, diffuse hypokinesis.   2.  Moderately dilated RV with EF 28%.   3. Nonspecific inferoseptal RV insertion site LGE, can be suggestive of pressure overload/CHF.   4. ECV percentage elevated at 39%, can see with increased  fibrosis with cardiomyopathy or myocarditis. Generally amyloidosis tends to be >40%.       Past Medical History:  Diagnosis Date   CHF (congestive heart failure) ([place])    COVID    2 weeks ago   Diabetes mellitus without complication ([place])    Hypertension    Past Surgical History:  Procedure Laterality Date   RIGHT/LEFT HEART CATH AND CORONARY ANGIOGRAPHY N/A 05/26/2019   Procedure: RIGHT/LEFT HEART CATH AND CORONARY ANGIOGRAPHY;   Surgeon: [name], MD;  Location: [facility];  Service: Cardiovascular;  Laterality: N/A;     Current Outpatient Medications  Medication Sig Dispense Refill   albuterol (ACCUNEB) 0.63 MG/3ML nebulizer solution Take 1 ampule by nebulization every 4 (four) hours as needed for shortness of breath.      albuterol (VENTOLIN HFA) 108 (90 Base) MCG/ACT inhaler Inhale 2 puffs into the lungs every 4 (four) hours as needed for wheezing.     amiodarone (PACERONE) 200 MG tablet Take 1 tablet by mouth once daily 30 tablet 5   amLODipine (NORVASC) 5 MG tablet Take 1 tablet by mouth once daily 90 tablet 2   aspirin EC 81 MG EC tablet Take 1 tablet (81 mg total) by mouth daily. 90 tablet 0   atorvastatin (LIPITOR) 40 MG tablet Take 40 mg by mouth daily at 6 PM.      carvedilol (COREG) 6.25 MG tablet Take 1 tablet (6.25 mg total) by mouth 2 (two) times daily. 180 tablet 3   dapagliflozin propanediol (FARXIGA) 10 MG TABS tablet Take 1 tablet (10 mg total) by mouth daily before breakfast. 30 tablet 6   ENTRESTO 97-103 MG Take 1 tablet by mouth twice daily 60 tablet 0   latanoprost (XALATAN) 0.005 % ophthalmic solution Place 1 drop into both eyes at bedtime.     metFORMIN (GLUCOPHAGE) 500 MG tablet Take 500 mg by mouth 2 (two) times daily with a meal.      omeprazole (PRILOSEC) 40 MG capsule Take 40 mg by mouth daily.     spironolactone (ALDACTONE) 25 MG tablet Take 1 tablet (25 mg total) by mouth at bedtime. 30 tablet 6   torsemide (DEMADEX) 20 MG tablet Take 1 tablet (20 mg total) by mouth daily as needed (for weight gain). 30 tablet 1   traMADol (ULTRAM-ER) 100 MG 24 hr tablet Take 100 mg by mouth daily as needed for pain.     No current facility-administered medications for this encounter.    Allergies:   Benadryl [diphenhydramine], Codeine, and Penicillins   Social History:  The patient  reports that he quit smoking about 31 years ago. His smoking use included cigarettes. He has never  used smokeless tobacco. He reports that he does not currently use alcohol. He reports that he does not currently use drugs.   Family History:  The patient's family history includes CAD in his paternal uncle; Heart attack in his paternal grandfather; Heart disease in his father.   ROS:  Please see the history of present illness.   All other systems are personally reviewed and negative.   Vitals:   12/28/20 1041  BP: (!) 146/80  Pulse: 66  SpO2: 98%  Weight: 77.5 kg (170 lb 12.8 oz)    Exam:   General:  Well appearing. No resp difficulty HEENT: normal Neck: supple. no JVD. Carotids 2+ bilat; no bruits. No lymphadenopathy or thryomegaly appreciated. Cor: PMI nondisplaced. Regular rate & rhythm. No rubs, gallops or murmurs. Lungs: clear Abdomen: soft,  nontender, nondistended. No hepatosplenomegaly. No bruits or masses. Good bowel sounds. Extremities: no cyanosis, clubbing, rash, edema Neuro: alert & orientedx3, cranial nerves grossly intact. moves all 4 extremities w/o difficulty. Affect pleasant   04/24/2020: ALT 29 06/25/2020: B Natriuretic Peptide 19.8; BUN 12; Creatinine, Ser 0.99; Potassium 4.6; Sodium 135  Personally reviewed   Wt Readings from Last 3 Encounters:  12/28/20 77.5 kg (170 lb 12.8 oz)  06/25/20 77.6 kg (171 lb)  04/24/20 80.4 kg (177 lb 3.2 oz)      ASSESSMENT AND PLAN:  1. Chronic Systolic Heart Failure (NICM) -Echo @ [facility] 2/21 EF of 25 to 30% with severe global hypokinesis.  RV also noted to be moderately enlarged with moderately impaired RV systolic function. -LHC 05/26/19 w/ mild, nonobstructive CAD. RHC w/ compensated hemodynamics  - cMRI 2/21 EF 18% w/ nonspecific inferoseptal RV insertion site LGE. ECV percentage elevated at 39%, can see w/ increased fibrosis with cardiomyopathy or myocarditis. Suspect PVC also contributing. PVCs now suppressed with amio.  - Echo 08/29/19 EF 40-45% (improved with PVC suppression) - Echo 04/24/20 EF 35-40% RV mildly  HK. - cMRI 3/22 LVEF 40% RVEF 43% mild LGE ECV 38%  - Echo today 12/28/20 EF 50-55% Personally reviewed - Stable NYHA I-II Volume status ok Continue PRN torsemide.  - Continue carvedilol 6.25 mg bid - Continue Entresto 97/103 bid - Continue spironolactone 25 mg daily.  - Continue Farxiga 10 - Based on improvement in cMRI, I suspect main issue was resolving myocarditis. Doubt sarcoid as EF unliekly to get better without steroid treatment  2. PVCs - [facility]io 2/21 burden ~8-10% - PVCs suppressed with amio but EF has not normalized - Sleep study negative - [facility] 2/22 Rare PVCs (<1%) - Will stop amio. Watch for recurrence    3. Hypertension - Blood pressure mildly elevated here but ok at home  4. Hyperlipidemia - Continue Lipitor 40mg  qhs. General Cardiology to continue to follow    5. Type 2 Diabetes Mellitus - Continue Metformin & Farxiga  - Check HGba1c today as part of routine labs - Followed by PCP  6. COPD - PFTs 9/21 Mild obstruction FEV1 2.55L (77%)  Signed, Glori Bickers, MD  12/28/2020 10:58 AM  Advanced [facility] Clinic [facility] [place] [address] [phone] (office) [phone] (fax)

## 2021-01-26 ENCOUNTER — Other Ambulatory Visit (HOSPITAL_COMMUNITY): Payer: Self-pay | Admitting: Internal Medicine

## 2021-01-28 ENCOUNTER — Other Ambulatory Visit (HOSPITAL_COMMUNITY): Payer: Self-pay

## 2021-04-20 ENCOUNTER — Other Ambulatory Visit (HOSPITAL_COMMUNITY): Payer: Self-pay | Admitting: Internal Medicine

## 2021-05-15 ENCOUNTER — Other Ambulatory Visit (HOSPITAL_COMMUNITY): Payer: Self-pay | Admitting: Cardiology

## 2021-05-15 MED ORDER — DAPAGLIFLOZIN PROPANEDIOL 10 MG PO TABS: 10.0000 mg | ORAL_TABLET | Freq: Every day | ORAL | 3 refills | Status: DC

## 2021-05-16 ENCOUNTER — Other Ambulatory Visit (HOSPITAL_COMMUNITY): Payer: Self-pay | Admitting: Internal Medicine

## 2021-05-17 ENCOUNTER — Other Ambulatory Visit (HOSPITAL_COMMUNITY): Payer: Self-pay

## 2021-05-17 MED ORDER — DAPAGLIFLOZIN PROPANEDIOL 10 MG PO TABS: 10.0000 mg | ORAL_TABLET | Freq: Every day | ORAL | 0 refills | Status: DC

## 2021-06-06 ENCOUNTER — Telehealth (HOSPITAL_COMMUNITY): Payer: Self-pay | Admitting: Cardiology

## 2021-06-06 NOTE — Telephone Encounter (Signed)
Pt called with questions about farxiga ? ?Pt approved for [facility] program thru 03/2022 ? ?Pt given [facility] phone number to request refill (mailing shipment) ? ?Advised they sometimes like to verify demographic info before mailing next shipment ? ?Advised refill was sent to appropriate pharmacy 05/2021 ? ?Pt reports he will contact pharm for next mail order ?Advised he can call back for samples if needed or another local pharmacy fill  ?

## 2021-06-13 ENCOUNTER — Telehealth (HOSPITAL_COMMUNITY): Payer: Self-pay | Admitting: Pharmacy Technician

## 2021-06-13 ENCOUNTER — Telehealth (HOSPITAL_COMMUNITY): Payer: Self-pay | Admitting: *Deleted

## 2021-06-13 ENCOUNTER — Encounter (HOSPITAL_COMMUNITY): Payer: Self-pay

## 2021-06-13 NOTE — Progress Notes (Unsigned)
Medication Samples have been provided to the patient. ? ?Drug name: Wilder Glade        Strength: 10 mg        Qty: 2  LOT: [id number]  Exp.Date: 11/05/2023 ? ?Dosing instructions: Take 1 tablet daily ? ?The patient has been instructed regarding the correct time, dose, and frequency of taking this medication, including desired effects and most common side effects.  ? ?Juanita Laster [place] ?11:27 AM ?06/13/2021 ? ?

## 2021-06-13 NOTE — Telephone Encounter (Signed)
Pt called stating he's had cramps the last 3 nights and wants to know if he can take magnesium or any other medication for cramps. Also wants to know what is a safe antibiotic to take for sinus infection. Pt would like for Dr.Bensimhon to tell him the name of a safe antibiotic and he will have his pcp prescribe. ? ?Routed to [place] for advice ?

## 2021-06-13 NOTE — Telephone Encounter (Signed)
Advanced Heart Failure Patient Advocate Encounter  Patient called and left message stating that his Wilder Glade was coming from [facility]. He will run out before the shipment comes in 10 days. Neville Route (RN) a request for 2 weeks of samples. Called and informed the patient's wife where to pick up.  Charlann Boxer, CPhT

## 2021-06-14 NOTE — Telephone Encounter (Signed)
Spoke with wife she is aware.  ?

## 2021-06-23 NOTE — Progress Notes (Signed)
?  ? ?Advanced HF Clinic Note ? ? ?Date:  06/24/2021  ? ?ID:  Roy Duke, DOB Jul 18, 1950, MRN [id number]  Location: Home  ?Provider location: [facility] ?Type of Visit: Established patient ? ?PCP:  [name], MD  ?Cardiologist:  None ?Primary HF: Aarionna Germer ? ?Chief Complaint: Heart Failure follow-up ?  ?History of Present Illness: ? ?Roy Duke is a 71 y/o male with HTN, HL, DM2, former tobacco, ETOH use (quit 30 years ago) and systolic HF due to NICM. ? ?Admitted 12/20 to [facility] with severe hyponatremia and HF.  Echo EF 25%. Developed NSVT with polymorphic ectopy. ECG with RBBB and new lateral TWI. He was transferred to [facility]. ?  ?LHC w/ mild, nonobstructive CAD. Well compensated hemodynamics on RHC. cMRi EF 28% w/ nonspecific inferoseptal RV insertion site LGE. ECV percentage elevated at 39%. 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  He was started on amiodarone for suppression of PVCs and fitted w/ a LifeVest prior to discharge.  ?  ?Sleep study 3/21 AHI 2.3  ? ?Echo 5/21: EF 40-45% ? ?Echo 1/22 EF 35-40% RV mildly HK. Personally reviewed  ? ?cMRI 3/22 - repeated due to persistent LV dysfunction despite PVC suppression ?1. LVEF 40% ECV = 38%.Midwall LGE in the basal anterior and anteroseptal walls, ?2. RVEF 43%  ?3. When compared to the prior study from 05/29/2019, there is decrease ?in size in both ventricles as well as improvement in biventricular ?systolic function. ECV is 38%. There are 2 segments of midwall ?nonspecific LGE that might be associated with idiopathic NICM. Consider sarcoid  ? ?Echo (12/28/20): EF 50-55% Personally reviewed ? ?Here for f/u with his wife. At last visit in 9/22 amio stopped.  Says he feels good. Now back to work at [employer]. Had URI recently. Denies palpitations or flutterings. No SOB, orthopnea or edema. Remains fatigued ? ? ?Cardiac Studies ?  ?2D echo 05/2019 ?Left ventricular ejection fraction, by  estimation, is 20 to 25%. The left ventricle has severely ?decreased function. The left ventricle demonstrates global hypokinesis. The left ventricular ?internal cavity size was moderately dilated. Left ventricular diastolic parameters are ?indeterminate. ?2. Right ventricular systolic function is mildly reduced. The right ventricular size is normal. ?There is normal pulmonary artery systolic pressure. The estimated right ventricular systolic ?pressure is 78.4 mmHg. ?3. Left atrial size was moderately dilated. ?4. The mitral valve is normal in structure and function. Mild mitral valve regurgitation. No ?evidence of mitral stenosis. ?5. The aortic valve is tricuspid. Aortic valve regurgitation is not visualized. No aortic stenosis is ?present. ?6. The inferior vena cava is normal in size with greater than 50% respiratory variability, ?suggesting right atrial pressure of 3 mmHg. ?  ?  ?R/LHC 05/26/19 ?Ost LAD to Prox LAD lesion is 20% stenosed. ?  ?Findings: ?  ?Ao = 100/72 (86) ?LV = 103/14 ?RA =  2 ?RV = 27/4 ?PA = 32/12 (22) ?PCW = 14 ?Fick cardiac output/index = 6.5/3.3 ?PVR = 1.2 WU ?FA sat = 99% ?PA sat = 75%, 76% ?  ?Assessment: ?1. Minimal CAD  ?2. Severe NICM EF 15% ?3. Well-compensated hemodynamics ?  ?cMRi 05/26/19 ?IMPRESSION: ?1.  Severely dilated LV with EF 18%, diffuse hypokinesis. ?  ?2.  Moderately dilated RV with EF 28%. ?  ?3. Nonspecific inferoseptal RV insertion site LGE, can be suggestive ?of pressure overload/CHF. ?  ?4. ECV percentage elevated at 39%, can see with increased fibrosis ?with cardiomyopathy  or myocarditis. Generally amyloidosis tends to ?be >40%. ?    ? ? ?Past Medical History:  ?Diagnosis Date  ? CHF (congestive heart failure) ([place])   ? COVID   ? 2 weeks ago  ? Diabetes mellitus without complication ([place])   ? Hypertension   ? ?Past Surgical History:  ?Procedure Laterality Date  ? RIGHT/LEFT HEART CATH AND CORONARY ANGIOGRAPHY N/A 05/26/2019  ? Procedure: RIGHT/LEFT HEART CATH AND  CORONARY ANGIOGRAPHY;  Surgeon: [name], MD;  Location: [place] [facility];  Service: Cardiovascular;  Laterality: N/A;  ? ? ? ?Current Outpatient Medications  ?Medication Sig Dispense Refill  ? albuterol (ACCUNEB) 0.63 MG/3ML nebulizer solution Take 1 ampule by nebulization every 4 (four) hours as needed for shortness of breath.     ? albuterol (VENTOLIN HFA) 108 (90 Base) MCG/ACT inhaler Inhale 2 puffs into the lungs every 4 (four) hours as needed for wheezing.    ? amLODipine (NORVASC) 5 MG tablet Take 1 tablet by mouth once daily 90 tablet 2  ? aspirin EC 81 MG EC tablet Take 1 tablet (81 mg total) by mouth daily. 90 tablet 0  ? atorvastatin (LIPITOR) 40 MG tablet Take 40 mg by mouth daily at 6 PM.     ? carvedilol (COREG) 6.25 MG tablet Take 1 tablet by mouth twice daily 180 tablet 0  ? dapagliflozin propanediol (FARXIGA) 10 MG TABS tablet Take 1 tablet (10 mg total) by mouth daily before breakfast. 90 tablet 3  ? Dupilumab (DUPIXENT [place]) Inject 1 Dose into the skin every 14 (fourteen) days.    ? ENTRESTO 97-103 MG Take 1 tablet by mouth twice daily 60 tablet 0  ? metFORMIN (GLUCOPHAGE) 500 MG tablet Take 500 mg by mouth 2 (two) times daily with a meal.     ? omeprazole (PRILOSEC) 40 MG capsule Take 40 mg by mouth daily.    ? spironolactone (ALDACTONE) 25 MG tablet TAKE 1 TABLET BY MOUTH AT BEDTIME 90 tablet 3  ? torsemide (DEMADEX) 20 MG tablet Take 1 tablet (20 mg total) by mouth daily as needed (for weight gain). 30 tablet 1  ? traMADol (ULTRAM-ER) 100 MG 24 hr tablet Take 100 mg by mouth daily as needed for pain.    ? ?No current facility-administered medications for this encounter.  ? ? ?Allergies:   Benadryl [diphenhydramine], Codeine, and Penicillins  ? ?Social History:  The patient  reports that he quit smoking about 32 years ago. His smoking use included cigarettes. He has never used smokeless tobacco. He reports that he does not currently use alcohol. He reports that he does not currently  use drugs.  ? ?Family History:  The patient's family history includes CAD in his paternal uncle; Heart attack in his paternal grandfather; Heart disease in his father.  ? ?ROS:  Please see the history of present illness.   All other systems are personally reviewed and negative.  ? ?Vitals:  ? 06/24/21 1028  ?BP: 130/72  ?Pulse: 72  ?SpO2: 96%  ?Weight: 80.5 kg (177 lb 6.4 oz)  ? ? ? ?Exam:   ?General:  Well appearing. No resp difficulty ?HEENT: normal ?Neck: supple. no JVD. Carotids 2+ bilat; no bruits. No lymphadenopathy or thryomegaly appreciated. ?Cor: PMI nondisplaced. Mildly irregular rate & rhythm. No rubs, gallops or murmurs. ?Lungs: clear ?Abdomen: soft, nontender, nondistended. No hepatosplenomegaly. No bruits or masses. Good bowel sounds. ?Extremities: no cyanosis, clubbing, rash, edema ?Neuro: alert & orientedx3, cranial nerves grossly intact. moves all 4  extremities w/o difficulty. Affect pleasant ? ?ECG: NSR with PACs Personally reviewed ? ? ?06/25/2020: B Natriuretic Peptide 19.8 ?12/28/2020: ALT 27; BUN 13; Creatinine, Ser 0.99; Hemoglobin 13.3; Platelets 159; Potassium 4.5; Sodium 137  ?Personally reviewed  ? ?Wt Readings from Last 3 Encounters:  ?06/24/21 80.5 kg (177 lb 6.4 oz)  ?12/28/20 77.5 kg (170 lb 12.8 oz)  ?06/25/20 77.6 kg (171 lb)  ?  ? ? ?ASSESSMENT AND PLAN: ? ?1. Chronic Systolic Heart Failure (NICM) ?-Echo @ [place] 2/21 EF of 25 to 30% with severe global hypokinesis.  RV also noted to be moderately enlarged with moderately impaired RV systolic function. ?-LHC 05/26/19 w/ mild, nonobstructive CAD. RHC w/ compensated hemodynamics  ?- cMRI 2/21 EF 18% w/ nonspecific inferoseptal RV insertion site LGE. ECV percentage elevated at 39%, can see w/ increased fibrosis with cardiomyopathy or myocarditis. Suspect PVC also contributing. PVCs now suppressed with amio.  ?- Echo 08/29/19 EF 40-45% (improved with PVC suppression) ?- Echo 04/24/20 EF 35-40% RV mildly HK. ?- cMRI 3/22 LVEF 40% RVEF 43%  mild LGE ECV 38%  ?- Echo 12/28/20 EF 50-55%  ?- Stable NYHA I-II Volume status ok Continue PRN torsemide.  ?- Continue carvedilol 6.25 mg bid ?- Continue Entresto 97/103 bid ?- Continue spironolactone 25 mg daily.

## 2021-06-24 ENCOUNTER — Other Ambulatory Visit (HOSPITAL_COMMUNITY): Payer: Self-pay | Admitting: Internal Medicine

## 2021-06-24 ENCOUNTER — Ambulatory Visit (HOSPITAL_COMMUNITY)
Admission: RE | Admit: 2021-06-24 | Discharge: 2021-06-24 | Disposition: A | Discharge status: Home / Self Care | Payer: Medicare HMO | Source: Ambulatory Visit | Attending: Internal Medicine | Admitting: Internal Medicine

## 2021-06-24 ENCOUNTER — Encounter (HOSPITAL_COMMUNITY): Payer: Self-pay | Admitting: Internal Medicine

## 2021-06-24 ENCOUNTER — Other Ambulatory Visit: Payer: Self-pay

## 2021-06-24 VITALS — BP 130/72 | HR 72 | Wt 177.4 lb

## 2021-06-24 DIAGNOSIS — Z87891 Personal history of nicotine dependence: Secondary | ICD-10-CM | POA: Diagnosis not present

## 2021-06-24 DIAGNOSIS — I110 Hypertensive heart disease with heart failure: Secondary | ICD-10-CM | POA: Diagnosis not present

## 2021-06-24 DIAGNOSIS — Z7984 Long term (current) use of oral hypoglycemic drugs: Secondary | ICD-10-CM | POA: Diagnosis not present

## 2021-06-24 DIAGNOSIS — I428 Other cardiomyopathies: Secondary | ICD-10-CM | POA: Diagnosis not present

## 2021-06-24 DIAGNOSIS — E785 Hyperlipidemia, unspecified: Secondary | ICD-10-CM | POA: Diagnosis not present

## 2021-06-24 DIAGNOSIS — E119 Type 2 diabetes mellitus without complications: Secondary | ICD-10-CM | POA: Insufficient documentation

## 2021-06-24 DIAGNOSIS — I5022 Chronic systolic (congestive) heart failure: Secondary | ICD-10-CM | POA: Diagnosis present

## 2021-06-24 DIAGNOSIS — I493 Ventricular premature depolarization: Secondary | ICD-10-CM | POA: Diagnosis not present

## 2021-06-24 DIAGNOSIS — I10 Essential (primary) hypertension: Secondary | ICD-10-CM

## 2021-06-24 DIAGNOSIS — J449 Chronic obstructive pulmonary disease, unspecified: Secondary | ICD-10-CM | POA: Diagnosis not present

## 2021-06-24 DIAGNOSIS — Z79899 Other long term (current) drug therapy: Secondary | ICD-10-CM | POA: Insufficient documentation

## 2021-06-24 NOTE — Patient Instructions (Signed)
Your provider has recommended that  you wear a Zio Patch for 14 days.  This monitor will record your heart rhythm for our review.  IF you have any symptoms while wearing the monitor please press the button.  If you have any issues with the patch or you notice a red or orange light on it please call the company at [phone].  Once you remove the patch please mail it back to the company as soon as possible so we can get the results. ? ?Your physician recommends that you schedule a follow-up appointment in: 6 months (Sept 2023), **PLEASE CALL OUR OFFICE IN July TO SCHEDULE THIS APPOINTMENT ? ?If you have any questions or concerns before your next appointment please send [place] a message through [place] or call our office at [phone].   ? ?TO LEAVE A MESSAGE FOR THE NURSE SELECT OPTION 2, PLEASE LEAVE A MESSAGE INCLUDING: ?YOUR NAME ?DATE OF BIRTH ?CALL BACK NUMBER ?REASON FOR CALL**this is important as we prioritize the call backs ? ?YOU WILL RECEIVE A CALL BACK THE SAME DAY AS LONG AS YOU CALL BEFORE 4:00 PM ? ?At the [facility], you and your health needs are our priority. As part of our continuing mission to provide you with exceptional heart care, we have created designated Provider Care Teams. These Care Teams include your primary Cardiologist (physician) and Advanced Practice Providers (APPs- Physician Assistants and Nurse Practitioners) who all work together to provide you with the care you need, when you need it.  ? ?You may see any of the following providers on your designated Care Team at your next follow up: ?Dr Glori Bickers ?Dr [name] ?Darrick Grinder, NP ?Lyda Jester, PA ?Jessica Milford,NP ?Marlyce Huge, PA ?Audry Riles, PharmD ? ? ?Please be sure to bring in all your medications bottles to every appointment.  ? ? ?

## 2021-06-28 ENCOUNTER — Other Ambulatory Visit (HOSPITAL_COMMUNITY): Payer: Self-pay | Admitting: Internal Medicine

## 2021-07-17 ENCOUNTER — Telehealth (HOSPITAL_COMMUNITY): Payer: Self-pay | Admitting: Cardiology

## 2021-07-17 DIAGNOSIS — I493 Ventricular premature depolarization: Secondary | ICD-10-CM

## 2021-07-17 MED ORDER — MEXILETINE HCL 200 MG PO CAPS: 200.0000 mg | ORAL_CAPSULE | Freq: Two times a day (BID) | ORAL | 3 refills | Status: DC

## 2021-07-17 NOTE — Telephone Encounter (Signed)
-----   Message from [name], MD sent at 07/17/2021 12:14 AM EDT ----- ?Start mexilitene 200 bid. Refer to EP please  ?

## 2021-07-17 NOTE — Telephone Encounter (Signed)
Patient called.  Patient aware via wife ?Referral placed. Meds ordered  ?

## 2021-07-19 ENCOUNTER — Telehealth (HOSPITAL_COMMUNITY): Payer: Self-pay | Admitting: Pharmacy Technician

## 2021-07-19 ENCOUNTER — Ambulatory Visit (HOSPITAL_BASED_OUTPATIENT_CLINIC_OR_DEPARTMENT_OTHER): Payer: Medicare HMO | Admitting: Internal Medicine

## 2021-07-19 ENCOUNTER — Other Ambulatory Visit (HOSPITAL_COMMUNITY): Payer: Self-pay

## 2021-07-19 ENCOUNTER — Encounter (HOSPITAL_BASED_OUTPATIENT_CLINIC_OR_DEPARTMENT_OTHER): Payer: Self-pay | Admitting: Internal Medicine

## 2021-07-19 VITALS — BP 134/80 | HR 74 | Ht 70.0 in | Wt 175.4 lb

## 2021-07-19 DIAGNOSIS — I493 Ventricular premature depolarization: Secondary | ICD-10-CM

## 2021-07-19 DIAGNOSIS — I5020 Unspecified systolic (congestive) heart failure: Secondary | ICD-10-CM

## 2021-07-19 NOTE — Telephone Encounter (Signed)
Patient Advocate Encounter ?  ?Received notification from [place] that prior authorization for Mexiletine is required. ?  ?PA submitted on CoverMyMeds ?Key [id number] ?Status is pending ?  ?Will continue to follow. ? ?

## 2021-07-19 NOTE — Telephone Encounter (Signed)
Advanced Heart Failure Patient Advocate Encounter ? ?Prior Authorization for Mexiletine has been approved.   ? ?PA# [id number] ?Effective dates: 04/07/21 through 04/06/22 ? ?Patients co-pay is $100 (30 days), $300 (90 days) ? ?Called and spoke with the patient's wife. Was able to help her sign up for a goodrx for him. 30 day co-pay with good rx is about $48. She was appreciative of the assistance. Advised her to let us know if there are any issues.  ? ?Charlann Boxer, CPhT ? ? ?

## 2021-07-19 NOTE — Patient Instructions (Addendum)
Medication Instructions:  ?Your physician recommends that you continue on your current medications as directed. Please refer to the Current Medication list given to you today. ? ?Labwork: ?None ordered. ? ?Testing/Procedures: ?None ordered. ? ?Follow-Up: ?Your physician wants you to follow-up in: 2 months with Dr. Curt Bears at the [facility] office.  ? ?Any Other Special Instructions Will Be Listed Below (If Applicable). ? ?If you need a refill on your cardiac medications before your next appointment, please call your pharmacy.  ? ?Important Information About Sugar ? ? ? ? ? ? ? ?

## 2021-07-19 NOTE — Progress Notes (Signed)
?  ? ?Electrophysiology Office Note ? ? ?Date:  07/19/2021  ? ?ID:  Roy Duke, DOB 14-Apr-1950, MRN [id number] ? ?PCP:  [name], MD  ?Cardiologist:  Dr Haroldine Laws ? ?CC: PVCs ?  ?History of Present Illness: ?Roy Duke is a 71 y.o. male who presents today for electrophysiology evaluation.   The patient has a long history of PVC induced cardiomyopathy.  He is well known to Dr Haroldine Laws and had resolution of reduced EF with PVC control by amiodarone previously. ?His amiodarone was discontinued 12/28/20 (note reviewed).  It does not appear that he had intolerance of issues with amiodarone.  The patient does not recall issues either. ?At that time, his PVC burden was  <1%.  He recently was seen for routine follow-up and had repeat Zio showing PVC burden of 16.5% with NSVT also noted (longest 5 beats).  There did appear to be two PVC morphologies with burden of 10 and 6.4% respectively.  The patient remains asymptomatic. ? ?Today, he denies symptoms of palpitations, chest pain, shortness of breath, orthopnea, PND, lower extremity edema, claudication, dizziness, presyncope, syncope, bleeding, or neurologic sequela. The patient is tolerating medications without difficulties and is otherwise without complaint today.  ? ? ?Past Medical History:  ?Diagnosis Date  ? CHF (congestive heart failure) ([place])   ? Diabetes mellitus without complication ([place])   ? Hypertension   ? NSVT (nonsustained ventricular tachycardia) ([place])   ? PVC's (premature ventricular contractions)   ? ?Past Surgical History:  ?Procedure Laterality Date  ? RIGHT/LEFT HEART CATH AND CORONARY ANGIOGRAPHY N/A 05/26/2019  ? Procedure: RIGHT/LEFT HEART CATH AND CORONARY ANGIOGRAPHY;  Surgeon: [name], MD;  Location: [place] [facility];  Service: Cardiovascular;  Laterality: N/A;  ? ? ? ?Current Outpatient Medications  ?Medication Sig Dispense Refill  ? albuterol (ACCUNEB) 0.63 MG/3ML nebulizer solution Take 1 ampule by nebulization  every 4 (four) hours as needed for shortness of breath.     ? albuterol (VENTOLIN HFA) 108 (90 Base) MCG/ACT inhaler Inhale 2 puffs into the lungs every 4 (four) hours as needed for wheezing.    ? amLODipine (NORVASC) 5 MG tablet Take 1 tablet by mouth once daily 90 tablet 2  ? aspirin EC 81 MG EC tablet Take 1 tablet (81 mg total) by mouth daily. 90 tablet 0  ? atorvastatin (LIPITOR) 40 MG tablet Take 40 mg by mouth daily at 6 PM.     ? carvedilol (COREG) 6.25 MG tablet Take 1 tablet by mouth twice daily 180 tablet 0  ? citalopram (CELEXA) 10 MG tablet Take 1 tablet by mouth daily.    ? dapagliflozin propanediol (FARXIGA) 10 MG TABS tablet Take 1 tablet (10 mg total) by mouth daily before breakfast. 90 tablet 3  ? Dupilumab (DUPIXENT [place]) Inject 1 Dose into the skin every 14 (fourteen) days.    ? ENTRESTO 97-103 MG Take 1 tablet by mouth twice daily 60 tablet 11  ? metFORMIN (GLUCOPHAGE) 500 MG tablet Take 500 mg by mouth 2 (two) times daily with a meal.     ? mexiletine (MEXITIL) 200 MG capsule Take 1 capsule (200 mg total) by mouth 2 (two) times daily. 180 capsule 3  ? omeprazole (PRILOSEC) 40 MG capsule Take 40 mg by mouth daily.    ? spironolactone (ALDACTONE) 25 MG tablet TAKE 1 TABLET BY MOUTH AT BEDTIME 90 tablet 3  ? torsemide (DEMADEX) 20 MG tablet Take 1 tablet (20 mg total) by mouth daily as  needed (for weight gain). 30 tablet 1  ? traMADol (ULTRAM-ER) 100 MG 24 hr tablet Take 100 mg by mouth daily as needed for pain.    ? ?No current facility-administered medications for this visit.  ? ? ?Allergies:   Benadryl [diphenhydramine], Codeine, and Penicillins  ? ?Social History:  The patient  reports that he quit smoking about 32 years ago. His smoking use included cigarettes. He has never used smokeless tobacco. He reports that he does not currently use alcohol. He reports that he does not currently use drugs.  ? ?Family History:  The patient's  family history includes CAD in his paternal uncle; Heart attack  in his paternal grandfather; Heart disease in his father.  ? ? ?ROS:  Please see the history of present illness.   All other systems are personally reviewed and negative.  ? ? ?PHYSICAL EXAM: ?VS:  BP 134/80   Pulse 74   Ht '5\' 10"'$  (1.778 m)   Wt 175 lb 6.4 oz (79.6 kg)   SpO2 97%   BMI 25.17 kg/m?  , BMI Body mass index is 25.17 kg/m?. ?GEN: Well nourished, well developed, in no acute distress ?HEENT: normal ?Neck: no JVD, carotid bruits, or masses ?Cardiac: RRR with ectopy; no murmurs, rubs, or gallops,no edema  ?Respiratory:  clear to auscultation bilaterally, normal work of breathing ?GI: soft, nontender, nondistended, + BS ?MS: no deformity or atrophy ?Skin: warm and dry  ?Neuro:  Strength and sensation are intact ?Psych: euthymic mood, full affect ? ?EKG:  EKG is ordered today. ?The ekg ordered today is personally reviewed and shows sinus rhythm with bigeminal PVCs.  The PVC morphology is of a left bundle branch with precordial transition at V2.   A leftward axis is also noted ? ? ?Recent Labs: ?12/28/2020: ALT 27; BUN 13; Creatinine, Ser 0.99; Hemoglobin 13.3; Platelets 159; Potassium 4.5; Sodium 137  ?personally reviewed  ? ?Lipid Panel  ?   ?Component Value Date/Time  ? CHOL 176 12/28/2020 1132  ? TRIG 96 12/28/2020 1132  ? HDL 83 12/28/2020 1132  ? CHOLHDL 2.1 12/28/2020 1132  ? VLDL 19 12/28/2020 1132  ? [place] 74 12/28/2020 1132  ? ?personally reviewed  ? ?Wt Readings from Last 3 Encounters:  ?07/19/21 175 lb 6.4 oz (79.6 kg)  ?06/24/21 177 lb 6.4 oz (80.5 kg)  ?12/28/20 170 lb 12.8 oz (77.5 kg)  ?  ? ? ?Other studies personally reviewed: ?Additional studies/ records that were reviewed today include: Dr Gillermina Hu notes, prior Zio, echo, cath/ MRI  ?Review of the above records today demonstrates: as above ? ? ?ASSESSMENT AND PLAN: ? ?1.  PVCs ?The patient presents with asymptomatic PVCs.  I agree with Dr Haroldine Laws that treatment of the PVCs is necessary given his prior likely PVC induced CM which  resolved with PVC suppression.  He did well initially with amiodarone.  I do not see that he had any issues with this medicine.  Amiodarone was stopped in September 2022 and PVCs have now returned. ?Therapeutic strategies for PVCs and ventricular tachycardia (nonsustained) including medicine (mexiletine, amiodarone) and ablation were discussed in detail with the patient today. Risk, benefits, and alternatives to each approach were discussed at length with the patient and his wife.  They would prefer medical therapy. ?The patient has been prescribed mexiletine by Dr Haroldine Laws but has not yet received his medicine from the pharmacy. ? ?Risks, benefits and potential toxicities for medications prescribed and/or refilled reviewed with patient today.  ? ?The PVC morphology  is such that it appears to arise form the RV septum inferior to the His, potentially in the slow pathway location.  Could also be from the posteroseptal LV under the aortic valve below the His.  If he fails medical therapy, we could consider ablation. ? ?Follow-up:  he wishes to follow-up in [facility] with Dr Curt Bears ? ?I would advise follow-up in 6-8 weeks to assess tolerance/ response to Mexiletine. ? ?Signed, ?Thompson Grayer, MD  ?07/19/2021 9:37 AM    ? ?[facility] ?[address] ?[address] ?[place] [address] ?[phone] (office) ?[phone] (fax) ? ?

## 2021-07-22 ENCOUNTER — Other Ambulatory Visit (HOSPITAL_COMMUNITY): Payer: Self-pay | Admitting: *Deleted

## 2021-07-22 ENCOUNTER — Telehealth (HOSPITAL_COMMUNITY): Payer: Self-pay | Admitting: *Deleted

## 2021-07-22 NOTE — Telephone Encounter (Signed)
Pt aware and will call me Wednesday with update.  ?

## 2021-07-22 NOTE — Telephone Encounter (Signed)
Pt called c/o dizziness for three days since starting mexiletine. Pts bp yesterday 130/79. Pt has not taken morning meds but bp was 171/105. Pt said dizziness is severe and did not start until he started new medication. ? ?Routed to [place] for advice  ?

## 2021-07-24 ENCOUNTER — Other Ambulatory Visit (HOSPITAL_COMMUNITY): Payer: Self-pay | Admitting: *Deleted

## 2021-07-24 ENCOUNTER — Other Ambulatory Visit (HOSPITAL_COMMUNITY): Payer: Self-pay | Admitting: Internal Medicine

## 2021-07-24 MED ORDER — AMIODARONE HCL 200 MG PO TABS: 200.0000 mg | ORAL_TABLET | Freq: Two times a day (BID) | ORAL | 3 refills | Status: DC

## 2021-07-24 NOTE — Telephone Encounter (Signed)
Pt called and stated he feels better overall and that the dizziness has improved. Pt asked that I send amio to pharmacy. Pt said he wakes up with a headache and bp left arm 139/110 right arm bp 154/90. Pt took meds an hour ago and rechecked bp it was 143/81 and headache gone. Pt asked that I inform Dr.Bensimhon of the high bp readings in the AM.  ?

## 2021-07-26 NOTE — Telephone Encounter (Signed)
Left vm on spouse vm for return call  ?

## 2021-08-16 ENCOUNTER — Telehealth (HOSPITAL_COMMUNITY): Payer: Self-pay | Admitting: *Deleted

## 2021-08-16 NOTE — Telephone Encounter (Signed)
Pt left vm stating he is nauseous all of the time pt thinks its anxiety because his wife is coming home from rehab and he needs to be at his best to take care of her. Pt asked what anxiety medications are safe to take with his heart medications.  ? ?Routed to [place] for advice  ?

## 2021-08-19 NOTE — Telephone Encounter (Signed)
Patient advised and verbalized understanding 

## 2021-09-12 ENCOUNTER — Other Ambulatory Visit (HOSPITAL_COMMUNITY): Payer: Self-pay | Admitting: Internal Medicine

## 2021-09-23 ENCOUNTER — Ambulatory Visit: Payer: Medicare HMO | Admitting: Cardiology

## 2021-10-16 ENCOUNTER — Other Ambulatory Visit: Payer: Self-pay

## 2021-10-16 ENCOUNTER — Encounter (HOSPITAL_COMMUNITY): Payer: Self-pay

## 2021-10-16 ENCOUNTER — Emergency Department (HOSPITAL_COMMUNITY): Payer: Medicare HMO

## 2021-10-16 ENCOUNTER — Observation Stay (HOSPITAL_COMMUNITY)
Admission: EM | Admit: 2021-10-16 | Discharge: 2021-10-17 | Disposition: A | Discharge status: Home / Self Care | Payer: Medicare HMO | Attending: Interventional Cardiology | Admitting: Interventional Cardiology

## 2021-10-16 DIAGNOSIS — E119 Type 2 diabetes mellitus without complications: Secondary | ICD-10-CM | POA: Diagnosis not present

## 2021-10-16 DIAGNOSIS — R55 Syncope and collapse: Principal | ICD-10-CM | POA: Diagnosis present

## 2021-10-16 DIAGNOSIS — R002 Palpitations: Secondary | ICD-10-CM | POA: Diagnosis not present

## 2021-10-16 DIAGNOSIS — Z79899 Other long term (current) drug therapy: Secondary | ICD-10-CM | POA: Insufficient documentation

## 2021-10-16 DIAGNOSIS — I10 Essential (primary) hypertension: Secondary | ICD-10-CM | POA: Diagnosis present

## 2021-10-16 DIAGNOSIS — I110 Hypertensive heart disease with heart failure: Secondary | ICD-10-CM | POA: Diagnosis not present

## 2021-10-16 DIAGNOSIS — Z7984 Long term (current) use of oral hypoglycemic drugs: Secondary | ICD-10-CM | POA: Diagnosis not present

## 2021-10-16 DIAGNOSIS — J449 Chronic obstructive pulmonary disease, unspecified: Secondary | ICD-10-CM | POA: Insufficient documentation

## 2021-10-16 DIAGNOSIS — Z20822 Contact with and (suspected) exposure to covid-19: Secondary | ICD-10-CM | POA: Insufficient documentation

## 2021-10-16 DIAGNOSIS — I509 Heart failure, unspecified: Secondary | ICD-10-CM

## 2021-10-16 DIAGNOSIS — I493 Ventricular premature depolarization: Secondary | ICD-10-CM | POA: Diagnosis not present

## 2021-10-16 DIAGNOSIS — Z87891 Personal history of nicotine dependence: Secondary | ICD-10-CM | POA: Diagnosis not present

## 2021-10-16 DIAGNOSIS — I5022 Chronic systolic (congestive) heart failure: Secondary | ICD-10-CM | POA: Diagnosis not present

## 2021-10-16 DIAGNOSIS — R531 Weakness: Secondary | ICD-10-CM | POA: Diagnosis present

## 2021-10-16 DIAGNOSIS — E785 Hyperlipidemia, unspecified: Secondary | ICD-10-CM

## 2021-10-16 DIAGNOSIS — E7800 Pure hypercholesterolemia, unspecified: Secondary | ICD-10-CM | POA: Diagnosis present

## 2021-10-16 DIAGNOSIS — Z7982 Long term (current) use of aspirin: Secondary | ICD-10-CM | POA: Insufficient documentation

## 2021-10-16 DIAGNOSIS — IMO0001 Reserved for inherently not codable concepts without codable children: Secondary | ICD-10-CM | POA: Diagnosis present

## 2021-10-16 LAB — RESP PANEL BY RT-PCR (FLU A&B, COVID) ARPGX2
Influenza A by PCR: NEGATIVE
Influenza B by PCR: NEGATIVE
SARS Coronavirus 2 by RT PCR: NEGATIVE

## 2021-10-16 LAB — CBC WITH DIFFERENTIAL/PLATELET
Abs Immature Granulocytes: 0.02 10*3/uL (ref 0.00–0.07)
Basophils Absolute: 0 10*3/uL (ref 0.0–0.1)
Basophils Relative: 1 %
Eosinophils Absolute: 0.1 10*3/uL (ref 0.0–0.5)
Eosinophils Relative: 2 %
HCT: 36.4 % — ABNORMAL LOW (ref 39.0–52.0)
Hemoglobin: 11.9 g/dL — ABNORMAL LOW (ref 13.0–17.0)
Immature Granulocytes: 1 %
Lymphocytes Relative: 13 %
Lymphs Abs: 0.6 10*3/uL — ABNORMAL LOW (ref 0.7–4.0)
MCH: 29.2 pg (ref 26.0–34.0)
MCHC: 32.7 g/dL (ref 30.0–36.0)
MCV: 89.4 fL (ref 80.0–100.0)
Monocytes Absolute: 0.4 10*3/uL (ref 0.1–1.0)
Monocytes Relative: 10 %
Neutro Abs: 3.1 10*3/uL (ref 1.7–7.7)
Neutrophils Relative %: 73 %
Platelets: 150 10*3/uL (ref 150–400)
RBC: 4.07 MIL/uL — ABNORMAL LOW (ref 4.22–5.81)
RDW: 14.6 % (ref 11.5–15.5)
WBC: 4.2 10*3/uL (ref 4.0–10.5)
nRBC: 0 % (ref 0.0–0.2)

## 2021-10-16 LAB — CBG MONITORING, ED: Glucose-Capillary: 128 mg/dL — ABNORMAL HIGH (ref 70–99)

## 2021-10-16 LAB — COMPREHENSIVE METABOLIC PANEL
ALT: 24 U/L (ref 0–44)
AST: 25 U/L (ref 15–41)
Albumin: 4 g/dL (ref 3.5–5.0)
Alkaline Phosphatase: 43 U/L (ref 38–126)
Anion gap: 9 (ref 5–15)
BUN: 15 mg/dL (ref 8–23)
CO2: 25 mmol/L (ref 22–32)
Calcium: 9.1 mg/dL (ref 8.9–10.3)
Chloride: 105 mmol/L (ref 98–111)
Creatinine, Ser: 1.26 mg/dL — ABNORMAL HIGH (ref 0.61–1.24)
GFR, Estimated: >60 mL/min (ref >60)
Glucose, Bld: 165 mg/dL — ABNORMAL HIGH (ref 70–99)
Potassium: 4.3 mmol/L (ref 3.5–5.1)
Sodium: 139 mmol/L (ref 135–145)
Total Bilirubin: 0.4 mg/dL (ref 0.3–1.2)
Total Protein: 6.5 g/dL (ref 6.5–8.1)

## 2021-10-16 LAB — TROPONIN I (HIGH SENSITIVITY)
Troponin I (High Sensitivity): 4 ng/L (ref <18)
Troponin I (High Sensitivity): 5 ng/L (ref <18)

## 2021-10-16 LAB — MAGNESIUM: Magnesium: 1.9 mg/dL (ref 1.7–2.4)

## 2021-10-16 LAB — BRAIN NATRIURETIC PEPTIDE: B Natriuretic Peptide: 22.6 pg/mL (ref 0.0–100.0)

## 2021-10-16 LAB — TSH: TSH: 2.668 u[IU]/mL (ref 0.350–4.500)

## 2021-10-16 MED ORDER — ALBUTEROL SULFATE (2.5 MG/3ML) 0.083% IN NEBU
2.5000 mg | INHALATION_SOLUTION | RESPIRATORY_TRACT | Status: DC | PRN
Start: 2021-10-16 — End: 2021-10-17

## 2021-10-16 MED ORDER — ASPIRIN 81 MG PO TBEC
81.0000 mg | DELAYED_RELEASE_TABLET | Freq: Every day | ORAL | Status: DC
  Administered 2021-10-17: 81 mg via ORAL
  Filled 2021-10-16: qty 1

## 2021-10-16 MED ORDER — ONDANSETRON HCL 4 MG/2ML IJ SOLN: 4.0000 mg | Freq: Four times a day (QID) | INTRAMUSCULAR | Status: DC | PRN

## 2021-10-16 MED ORDER — PANTOPRAZOLE SODIUM 40 MG PO TBEC
40.0000 mg | DELAYED_RELEASE_TABLET | Freq: Every day | ORAL | Status: DC
  Administered 2021-10-17: 40 mg via ORAL
  Filled 2021-10-16: qty 1

## 2021-10-16 MED ORDER — SPIRONOLACTONE 25 MG PO TABS
25.0000 mg | ORAL_TABLET | Freq: Every day | ORAL | Status: DC
  Administered 2021-10-16: 25 mg via ORAL
  Filled 2021-10-16: qty 1

## 2021-10-16 MED ORDER — ACETAMINOPHEN 325 MG PO TABS: 650.0000 mg | ORAL_TABLET | ORAL | Status: DC | PRN

## 2021-10-16 MED ORDER — AMIODARONE HCL 200 MG PO TABS
200.0000 mg | ORAL_TABLET | Freq: Two times a day (BID) | ORAL | Status: DC
  Administered 2021-10-16 – 2021-10-17 (×2): 200 mg via ORAL
  Filled 2021-10-16 (×2): qty 1

## 2021-10-16 MED ORDER — SACUBITRIL-VALSARTAN 97-103 MG PO TABS
1.0000 | ORAL_TABLET | Freq: Two times a day (BID) | ORAL | Status: DC
  Administered 2021-10-16: 1 via ORAL
  Filled 2021-10-16 (×4): qty 1

## 2021-10-16 MED ORDER — ALBUTEROL SULFATE HFA 108 (90 BASE) MCG/ACT IN AERS: 2.0000 | INHALATION_SPRAY | RESPIRATORY_TRACT | Status: DC | PRN

## 2021-10-16 MED ORDER — AMLODIPINE BESYLATE 5 MG PO TABS
5.0000 mg | ORAL_TABLET | Freq: Every day | ORAL | Status: DC
  Administered 2021-10-17: 5 mg via ORAL
  Filled 2021-10-16: qty 1

## 2021-10-16 MED ORDER — METFORMIN HCL 500 MG PO TABS
500.0000 mg | ORAL_TABLET | Freq: Two times a day (BID) | ORAL | Status: DC
  Administered 2021-10-17: 500 mg via ORAL
  Filled 2021-10-16: qty 1

## 2021-10-16 MED ORDER — VITAMIN D 25 MCG (1000 UNIT) PO TABS
1000.0000 [IU] | ORAL_TABLET | Freq: Every day | ORAL | Status: DC
  Administered 2021-10-17: 1000 [IU] via ORAL
  Filled 2021-10-16: qty 1

## 2021-10-16 MED ORDER — ENOXAPARIN SODIUM 40 MG/0.4ML IJ SOSY
40.0000 mg | PREFILLED_SYRINGE | Freq: Every day | INTRAMUSCULAR | Status: DC
  Administered 2021-10-17: 40 mg via SUBCUTANEOUS
  Filled 2021-10-16: qty 0.4

## 2021-10-16 MED ORDER — DAPAGLIFLOZIN PROPANEDIOL 10 MG PO TABS
10.0000 mg | ORAL_TABLET | Freq: Every day | ORAL | Status: DC
  Administered 2021-10-17: 10 mg via ORAL
  Filled 2021-10-16: qty 1

## 2021-10-16 MED ORDER — ASCORBIC ACID 500 MG PO TABS
500.0000 mg | ORAL_TABLET | Freq: Every day | ORAL | Status: DC
  Administered 2021-10-17: 500 mg via ORAL
  Filled 2021-10-16: qty 1

## 2021-10-16 MED ORDER — TRAMADOL HCL ER 100 MG PO TB24
200.0000 mg | ORAL_TABLET | Freq: Three times a day (TID) | ORAL | Status: DC | PRN
Start: 2021-10-16 — End: 2021-10-17

## 2021-10-16 MED ORDER — ATORVASTATIN CALCIUM 40 MG PO TABS: 40.0000 mg | ORAL_TABLET | Freq: Every day | ORAL | Status: DC

## 2021-10-16 MED ORDER — CARVEDILOL 3.125 MG PO TABS
6.2500 mg | ORAL_TABLET | Freq: Two times a day (BID) | ORAL | Status: DC
  Administered 2021-10-16 – 2021-10-17 (×2): 6.25 mg via ORAL
  Filled 2021-10-16 (×2): qty 2

## 2021-10-16 NOTE — ED Provider Triage Note (Addendum)
Emergency Medicine Provider Triage Evaluation Note  KIA STAVROS , a 71 y.o. male  was evaluated in triage.  Pt complains of palpitations, generalized weakness, fatigue, unexpected weight loss.  Patient reports that he has a "fluttering sensation," in his left arm.  Has been experiencing this more often recently.  Reports that he has had recent unexpected weight loss as well as decreased appetite.  Patient states that he has had generalized weakness feels that both of his legs are weak.  Patient also reports shortness of breath with exertion.  Review of Systems  Positive: Palpitations, generalized weakness, fatigue, unexpected weight loss Negative: Chest pain, leg swelling or tenderness, lightheadedness, syncope, fever, chills, orthopnea, numbness, weakness  Physical Exam  BP 121/74   Pulse 64   Temp 98.3 F (36.8 C) (Oral)   Resp 16   SpO2 97%  Gen:   Awake, no distress   Resp:  Normal effort, lungs clear to auscultation bilaterally MSK:   Moves extremities without difficulty Other:  No swelling or tenderness to bilateral lower extremities.  +2 radial pulse bilaterally.  Patient able to stand and ambulate without difficulty.  Medical Decision Making  Medically screening exam initiated at 12:40 PM.  Appropriate orders placed.  KETIH GOODIE was informed that the remainder of the evaluation will be completed by another provider, this initial triage assessment does not replace that evaluation, and the importance of remaining in the ED until their evaluation is complete.     Loni Beckwith, PA-C 10/16/21 1242    Loni Beckwith, PA-C 10/16/21 1243

## 2021-10-16 NOTE — H&P (Signed)
Cardiology Admission History and Physical:   Patient ID: Roy Duke MRN: [id number]; DOB: Mar 17, 1951   Admission date: 10/16/2021  Primary Care Provider: [name], MD [facility] Cardiologist: Roy Glance, MD [facility] Electrophysiologist:  None   Chief Complaint:  Weakness, Pre-syncope, Palpitations, Shortness of Breath  Patient Profile:   Roy Duke is a 71 y.o. male with HTN, HL, DM2, former tobacco, ETOH use (quit 30 years ago) and systolic HF due to NICM.  History of Present Illness:   [name] reports to the ED today with multiple complaints. He notes that for the last few months he has had progressive fatigue. He also notes shortness of breath with exertion that improves with rest. He also notes waking up with a significant feeling of doom that tends to improve as the day goes by. He also has had a intermittent "fluttering" sensations in his chest and left arm. He notes that they can last for days at a time. This episodes are not necessarily tied to his episodes of fatigue. Most recently he has had significant episodes of dizziness in the last week. He notes that they have been bad enough that he felts as though if he did not sit down, he would pass out. 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   He has a notable cardiac history and is followed by Dr Sung Amabile. He was last seen in March of this year. At that visit he did not have notable complaints. He had shown progressive improvement of his EF with most recent imaging (TTE 12/28/20) showing EF 50-55%. 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 He had been off amiodarone and had a holter monitor after that visit that showed a 16.5% burden of PVCs. Plan was for him to start on mexiletine but it does not appear that happened.   He had reassusringf initial cardiac workup in the ED with a normal BNP and negativen  high-sensitiyvy troponin x2. He seems t  Past Medical History:  Diagnosis Date   CHF (congestive heart failure) (HCC)    Diabetes mellitus without complication (HCC)    Hypertension    NSVT (nonsustained ventricular tachycardia) (HCC)    PVC's (premature ventricular contractions)     Past Surgical History:  Procedure Laterality Date   RIGHT/LEFT HEART CATH AND CORONARY ANGIOGRAPHY N/A 05/26/2019   Procedure: RIGHT/LEFT HEART CATH AND CORONARY ANGIOGRAPHY;  Surgeon: [name], MD;  Location: [place] [facility];  Service: Cardiovascular;  Laterality: N/A;     Medications Prior to Admission: Prior to Admission medications   Medication Sig Start Date End Date Taking? Authorizing Provider  albuterol (ACCUNEB) 0.63 MG/3ML nebulizer solution Take 1 ampule by nebulization every 4 (four) hours as needed for shortness of breath.  04/22/19  Yes [provider]  albuterol (VENTOLIN HFA) 108 (90 Base) MCG/ACT inhaler Inhale 2 puffs into the lungs every 4 (four) hours as needed for wheezing. 04/18/19  Yes [provider]  amiodarone (PACERONE) 200 MG tablet Take 1 tablet (200 mg total) by mouth 2 (two) times daily. 07/24/21  Yes Bensimhon, Shaune Pascal, MD  amLODipine (NORVASC) 5 MG tablet Take 1 tablet by mouth once daily 09/12/21  Yes Bensimhon, Shaune Pascal, MD  aspirin EC 81 MG EC tablet Take 1 tablet (81 mg total) by mouth daily. 05/30/19  Yes Reino Bellis B, NP  atorvastatin (LIPITOR) 40 MG tablet Take 40 mg by mouth  daily at 6 PM.  02/16/19  Yes [provider]  carvedilol (COREG) 6.25 MG tablet Take 1 tablet by mouth twice daily 07/24/21  Yes Bensimhon, Shaune Pascal, MD  cholecalciferol (VITAMIN D3) 25 MCG (1000 UNIT) tablet Take 1,000 Units by mouth daily.   Yes [provider]  dapagliflozin propanediol (FARXIGA) 10 MG TABS tablet Take 1 tablet (10 mg total) by mouth daily before breakfast. 05/15/21  Yes Bensimhon, Shaune Pascal, MD  Dupilumab (DUPIXENT [place]) Inject 1  Dose into the skin every 14 (fourteen) days.   Yes [provider]  ENTRESTO 97-103 MG Take 1 tablet by mouth twice daily 06/28/21  Yes Bensimhon, Shaune Pascal, MD  metFORMIN (GLUCOPHAGE) 500 MG tablet Take 500 mg by mouth 2 (two) times daily with a meal.    Yes [provider]  omeprazole (PRILOSEC) 40 MG capsule Take 40 mg by mouth daily. 01/13/19  Yes [provider]  spironolactone (ALDACTONE) 25 MG tablet TAKE 1 TABLET BY MOUTH AT BEDTIME Patient taking differently: Take 25 mg by mouth at bedtime. 01/30/21  Yes Bensimhon, Shaune Pascal, MD  torsemide (DEMADEX) 20 MG tablet Take 1 tablet (20 mg total) by mouth daily as needed (for weight gain). 07/21/19  Yes Bensimhon, Shaune Pascal, MD  traMADol (ULTRAM-ER) 100 MG 24 hr tablet Take 100 mg by mouth daily as needed for pain.   Yes [provider]  vitamin C (ASCORBIC ACID) 500 MG tablet Take 500 mg by mouth daily.   Yes [provider]     Allergies:    Allergies  Allergen Reactions   Benadryl [Diphenhydramine] Other (See Comments)    Bad dreams   Codeine Nausea Only    "makes me feel weird"   Penicillins Other (See Comments)    States a long time ago, does not recall reaction     Social History:   Social History   Socioeconomic History   Marital status: Married    Spouse name: Not on file   Number of children: Not on file   Years of education: Not on file   Highest education level: Not on file  Occupational History   Not on file  Tobacco Use   Smoking status: Former    Types: Cigarettes    Quit date: 04/07/1989    Years since quitting: 32.5   Smokeless tobacco: Never  Vaping Use   Vaping Use: Never used  Substance and Sexual Activity   Alcohol use: Not Currently   Drug use: Not Currently   Sexual activity: Yes  Other Topics Concern   Not on file  Social History Narrative   Not on file   Social Determinants of Health   Financial Resource Strain: Not on file  Food Insecurity: Not on  file  Transportation Needs: Not on file  Physical Activity: Not on file  Stress: Not on file  Social Connections: Not on file  Intimate Partner Violence: Not on file    Family History:   The patient's family history includes CAD in his paternal uncle; Heart attack in his paternal grandfather; Heart disease in his father.    ROS:  Review of Systems: [y] = yes, '[ ]'$  = no   General: Weight gain '[ ]'$ ; Weight loss Blue.Reese ]; Anorexia Blue.Reese ]; Fatigue Blue.Reese ]; Fever '[ ]'$ ; Chills '[ ]'$ ; Weakness '[ ]'$    Cardiac: Chest pain/pressure '[ ]'$ ; Resting SOB Blue.Reese ]; Exertional SOB '[ ]'$ ; Orthopnea '[ ]'$ ; Pedal Edema '[ ]'$ ; Palpitations Blue.Reese ]; Syncope '[ ]'$ ;  Presyncope '[ ]'$ ; Paroxysmal nocturnal dyspnea '[ ]'$    Pulmonary: Cough '[ ]'$ ; Wheezing '[ ]'$ ; Hemoptysis '[ ]'$ ; Sputum '[ ]'$ ; Snoring '[ ]'$    GI: Vomiting '[ ]'$ ; Dysphagia '[ ]'$ ; Melena '[ ]'$ ; Hematochezia '[ ]'$ ; Heartburn '[ ]'$ ; Abdominal pain '[ ]'$ ; Constipation '[ ]'$ ; Diarrhea '[ ]'$ ; BRBPR '[ ]'$    GU: Hematuria '[ ]'$ ; Dysuria '[ ]'$ ; Nocturia '[ ]'$  Vascular: Pain in legs with walking '[ ]'$ ; Pain in feet with lying flat '[ ]'$ ; Non-healing sores '[ ]'$ ; Stroke '[ ]'$ ; TIA '[ ]'$ ; Slurred speech '[ ]'$ ;   Neuro: Headaches '[ ]'$ ; Vertigo '[ ]'$ ; Seizures '[ ]'$ ; Paresthesias '[ ]'$ ;Blurred vision '[ ]'$ ; Diplopia '[ ]'$ ; Vision changes '[ ]'$    Ortho/Skin: Arthritis '[ ]'$ ; Joint pain '[ ]'$ ; Muscle pain '[ ]'$ ; Joint swelling '[ ]'$ ; Back Pain '[ ]'$ ; Rash '[ ]'$    Psych: Depression '[ ]'$ ; Anxiety '[ ]'$    Heme: Bleeding problems '[ ]'$ ; Clotting disorders '[ ]'$ ; Anemia '[ ]'$    Endocrine: Diabetes '[ ]'$ ; Thyroid dysfunction '[ ]'$    Physical Exam/Data:   Vitals:   10/16/21 2100 10/16/21 2115 10/16/21 2145 10/16/21 2230  BP: (!) 138/97 130/82 123/78 138/87  Pulse: 72 63 63 66  Resp: '16 12 15 13  '$ Temp:      TempSrc:      SpO2: 99% 96% 97% 99%  Weight:      Height:       No intake or output data in the 24 hours ending 10/16/21 2325    10/16/2021   12:44 PM 07/19/2021    9:03 AM 06/24/2021   10:28 AM  Last 3 Weights  Weight (lbs) 175 lb 175 lb 6.4 oz 177 lb 6.4 oz  Weight (kg)  79.379 kg 79.561 kg 80.468 kg     Body mass index is 25.11 kg/m.  General:  Well nourished, well developed, in no acute distress HEENT: normal Lymph: no adenopathy Neck: no JVD Endocrine:  No thryomegaly Vascular: No carotid bruits; FA pulses 2+ bilaterally without bruits  Cardiac:  normal S1, S2; RRR; no murmur  Lungs:  clear to auscultation bilaterally, no wheezing, rhonchi or rales  Abd: soft, nontender, no hepatomegaly  Ext: no LE edema Musculoskeletal:  No deformities, BUE and BLE strength normal and equal Skin: warm and dry  Neuro:  CNs 2-12 intact, no focal abnormalities noted Psych:  Normal affect   EKG:  The ECG that was done was personally reviewed and demonstrates normal sinus rhythm.   Relevant CV Studies: Echo 5/21: EF 40-45%   Echo 1/22 EF 35-40% RV mildly HK. Personally reviewed    cMRI 3/22 - repeated due to persistent LV dysfunction despite PVC suppression 1. LVEF 40% ECV = 38%.Midwall LGE in the basal anterior and anteroseptal walls, 2. RVEF 43%  3. When compared to the prior study from 05/29/2019, there is decrease in size in both ventricles as well as improvement in biventricular systolic function. ECV is 38%. There are 2 segments of midwall nonspecific LGE that might be associated with idiopathic NICM. Consider sarcoid    Echo (12/28/20): EF 50-55% Personally reviewed  Holter 14 Day (07/17/21) 1. Sinus rhythm with bundle branch block - avg HR of 84 bpm.  2.  768 runs of nonsustained Ventricular Tachycardia occurred, the run with the fastest interval lasting 6 beats with a max rate of 210 bpm, the longest lasting 8 beats with an avg rate of 106 bpm.  3. Two runs of Supraventricular Tachycardia runs  occurred, the run with the fastest interval lasting 5 beats with a max  rate of 190 bpm, the longest lasting 5 beats with an avg rate of 139 bpm. 4. Frequent PVCs (16.5%, [id number]), Ventricular Couplets were occasional (4.0%,  33733), and Ventricular Triplets were  occasional (1.0%, 5782).  5. Ventricular Bigeminy and Trigeminy were present. 6. Two primary PVC morphology with 10.0% and 6.4% burden, respectively  Laboratory Data:  High Sensitivity Troponin:   Recent Labs  Lab 10/16/21 1320 10/16/21 1954  TROPONINIHS 4 5      Chemistry Recent Labs  Lab 10/16/21 1320  NA 139  K 4.3  CL 105  CO2 25  GLUCOSE 165*  BUN 15  CREATININE 1.26*  CALCIUM 9.1  GFRNONAA >60  ANIONGAP 9    Recent Labs  Lab 10/16/21 1320  PROT 6.5  ALBUMIN 4.0  AST 25  ALT 24  ALKPHOS 43  BILITOT 0.4   Hematology Recent Labs  Lab 10/16/21 1320  WBC 4.2  RBC 4.07*  HGB 11.9*  HCT 36.4*  MCV 89.4  MCH 29.2  MCHC 32.7  RDW 14.6  PLT 150   BNP Recent Labs  Lab 10/16/21 1320  BNP 22.6    DDimer No results for input(s): "DDIMER" in the last 168 hours.  Radiology/Studies:  DG Chest 2 View  Result Date: 10/16/2021 CLINICAL DATA:  Generalized weakness. EXAM: CHEST - 2 VIEW COMPARISON:  12/15/2019 FINDINGS: The heart size and mediastinal contours are within normal limits. Both lungs are clear. The visualized skeletal structures are unremarkable. IMPRESSION: No active cardiopulmonary disease. Electronically Signed   By: Kathreen Devoid M.D.   On: 10/16/2021 13:31   {    Assessment and Plan:   # Pre-Syncope # Weakness # Palpitations Patient with multiple complaints however with his palpitations, pre-syncopal episodes and generalized weakness I am most concern about ruling out a malignant arrhyhtmia. I do not suspect that his EF has dramatically changed given his normal physical exam and BNP. It would still be reasonable to get repeat echo to rule out new changes in EF or any structural changes. If his initial workup is reassuring, he can likely be discharged with continue outpatient workup.  - telemetry - orthostatic vitals - TTE in the AM - consider outpatient sleep study if inpatient w/u negative  # Elevated Cr Cr 1.26 from baseline 1. Does not  technically meet criteria for AKI but may represent some volume depletion so will give 1L NS over course of a few hours.  # Chronic Systolic Heart Failure (NICM) Stable NYHA I-II Volume status ok. - Continue carvedilol 6.25 mg bid - Continue Entresto 97/103 bid - Continue spironolactone 25 mg daily.  - Continue Farxiga 10  # Hypertension Blood pressure well controlled. Continue current regimen.  # Hyperlipidemia - Continue Lipitor '40mg'$  qhs.   # Type 2 Diabetes Mellitus - Continue Metformin & Farxiga  - Followed by PCP  Severity of Illness: The appropriate patient status for this patient is OBSERVATION. Observation status is judged to be reasonable and necessary in order to provide the required intensity of service to ensure the patient's safety. The patient's presenting symptoms, physical exam findings, and initial radiographic and laboratory data in the context of their medical condition is felt to place them at decreased risk for further clinical deterioration. Furthermore, it is anticipated that the patient will be medically stable for discharge from the hospital within 2 midnights of admission.    For questions or updates, please contact [facility] Please consult [email or web address] for contact info under     Signed, Margie Billet, MD  10/16/2021 11:25 PM

## 2021-10-16 NOTE — ED Triage Notes (Signed)
Patient has multitude of complaints.  Patient complains of weakness as well as palpitations.

## 2021-10-16 NOTE — ED Provider Notes (Signed)
[facility] EMERGENCY DEPARTMENT Provider Note   CSN: [id number] Arrival date & time: 10/16/21  1159     History  Chief Complaint  Patient presents with  . Weakness  . Palpitations    Roy Duke is a 71 y.o. male.   Weakness Palpitations Associated symptoms: weakness     71 year old male with past medical history significant for HTN, DM 2, CHF (last EF in September 2022 to 59 to 55%) thought to be due to NICM, NSVT  Home Medications Prior to Admission medications   Medication Sig Start Date End Date Taking? Authorizing Provider  albuterol (ACCUNEB) 0.63 MG/3ML nebulizer solution Take 1 ampule by nebulization every 4 (four) hours as needed for shortness of breath.  04/22/19   [provider]  albuterol (VENTOLIN HFA) 108 (90 Base) MCG/ACT inhaler Inhale 2 puffs into the lungs every 4 (four) hours as needed for wheezing. 04/18/19   [provider]  amiodarone (PACERONE) 200 MG tablet Take 1 tablet (200 mg total) by mouth 2 (two) times daily. 07/24/21   Bensimhon, Shaune Pascal, MD  amLODipine (NORVASC) 5 MG tablet Take 1 tablet by mouth once daily 09/12/21   Bensimhon, Shaune Pascal, MD  aspirin EC 81 MG EC tablet Take 1 tablet (81 mg total) by mouth daily. 05/30/19   Cheryln Manly, NP  atorvastatin (LIPITOR) 40 MG tablet Take 40 mg by mouth daily at 6 PM.  02/16/19   [provider]  carvedilol (COREG) 6.25 MG tablet Take 1 tablet by mouth twice daily 07/24/21   Bensimhon, Shaune Pascal, MD  citalopram (CELEXA) 10 MG tablet Take 1 tablet by mouth daily. 05/12/16   [provider]  dapagliflozin propanediol (FARXIGA) 10 MG TABS tablet Take 1 tablet (10 mg total) by mouth daily before breakfast. 05/15/21   Bensimhon, Shaune Pascal, MD  Dupilumab (DUPIXENT [place]) Inject 1 Dose into the skin every 14 (fourteen) days.    [provider]  ENTRESTO 97-103 MG Take 1 tablet by mouth twice daily 06/28/21   Bensimhon, Shaune Pascal, MD  metFORMIN  (GLUCOPHAGE) 500 MG tablet Take 500 mg by mouth 2 (two) times daily with a meal.     [provider]  omeprazole (PRILOSEC) 40 MG capsule Take 40 mg by mouth daily. 01/13/19   [provider]  spironolactone (ALDACTONE) 25 MG tablet TAKE 1 TABLET BY MOUTH AT BEDTIME 01/30/21   Bensimhon, Shaune Pascal, MD  torsemide (DEMADEX) 20 MG tablet Take 1 tablet (20 mg total) by mouth daily as needed (for weight gain). 07/21/19   Bensimhon, Shaune Pascal, MD  traMADol (ULTRAM-ER) 100 MG 24 hr tablet Take 100 mg by mouth daily as needed for pain.    [provider]      Allergies    Benadryl [diphenhydramine], Codeine, and Penicillins    Review of Systems   Review of Systems  Cardiovascular:  Positive for palpitations.  Neurological:  Positive for weakness.    Physical Exam Updated Vital Signs BP (!) 154/83   Pulse 77   Temp 98.2 F (36.8 C) (Oral)   Resp 14   Ht '5\' 10"'$  (1.778 m)   Wt 79.4 kg   SpO2 99%   BMI 25.11 kg/m  Physical Exam  ED Results / Procedures / Treatments   Labs (all labs ordered are listed, but only abnormal results are displayed) Labs Reviewed  COMPREHENSIVE METABOLIC PANEL - Abnormal; Notable for the following components:      Result Value  Glucose, Bld 165 (*)    Creatinine, Ser 1.26 (*)    All other components within normal limits  CBC WITH DIFFERENTIAL/PLATELET - Abnormal; Notable for the following components:   RBC 4.07 (*)    Hemoglobin 11.9 (*)    HCT 36.4 (*)    Lymphs Abs 0.6 (*)    All other components within normal limits  CBG MONITORING, ED - Abnormal; Notable for the following components:   Glucose-Capillary 128 (*)    All other components within normal limits  RESP PANEL BY RT-PCR (FLU A&B, COVID) ARPGX2  MAGNESIUM  TSH  BRAIN NATRIURETIC PEPTIDE  TROPONIN I (HIGH SENSITIVITY)  TROPONIN I (HIGH SENSITIVITY)    EKG None  Radiology DG Chest 2 View  Result Date: 10/16/2021 CLINICAL DATA:  Generalized weakness. EXAM:  CHEST - 2 VIEW COMPARISON:  12/15/2019 FINDINGS: The heart size and mediastinal contours are within normal limits. Both lungs are clear. The visualized skeletal structures are unremarkable. IMPRESSION: No active cardiopulmonary disease. Electronically Signed   By: Kathreen Devoid M.D.   On: 10/16/2021 13:31    Procedures Procedures  {Document cardiac monitor, telemetry assessment procedure when appropriate:1}  Medications Ordered in ED Medications - No data to display  ED Course/ Medical Decision Making/ A&P                           Medical Decision Making  ***  {Document critical care time when appropriate:1} {Document review of labs and clinical decision tools ie heart score, Chads2Vasc2 etc:1}  {Document your independent review of radiology images, and any outside records:1} {Document your discussion with family members, caretakers, and with consultants:1} {Document social determinants of health affecting pt's care:1} {Document your decision making why or why not admission, treatments were needed:1} Final Clinical Impression(s) / ED Diagnoses Final diagnoses:  None    Rx / DC Orders ED Discharge Orders     None

## 2021-10-17 ENCOUNTER — Inpatient Hospital Stay (INDEPENDENT_AMBULATORY_CARE_PROVIDER_SITE_OTHER): Payer: Medicare HMO

## 2021-10-17 ENCOUNTER — Inpatient Hospital Stay (HOSPITAL_BASED_OUTPATIENT_CLINIC_OR_DEPARTMENT_OTHER): Payer: Medicare HMO

## 2021-10-17 ENCOUNTER — Encounter: Payer: Self-pay | Admitting: *Deleted

## 2021-10-17 DIAGNOSIS — I493 Ventricular premature depolarization: Secondary | ICD-10-CM

## 2021-10-17 DIAGNOSIS — R55 Syncope and collapse: Secondary | ICD-10-CM

## 2021-10-17 DIAGNOSIS — R008 Other abnormalities of heart beat: Secondary | ICD-10-CM

## 2021-10-17 LAB — ECHOCARDIOGRAM COMPLETE
Area-P 1/2: 3.27 cm2
Height: 70 in
S' Lateral: 5.9 cm
Weight: 2800 [oz_av]

## 2021-10-17 LAB — BASIC METABOLIC PANEL WITH GFR
Anion gap: 9 (ref 5–15)
BUN: 10 mg/dL (ref 8–23)
CO2: 25 mmol/L (ref 22–32)
Calcium: 8.8 mg/dL — ABNORMAL LOW (ref 8.9–10.3)
Chloride: 105 mmol/L (ref 98–111)
Creatinine, Ser: 0.89 mg/dL (ref 0.61–1.24)
GFR, Estimated: >60 mL/min
Glucose, Bld: 115 mg/dL — ABNORMAL HIGH (ref 70–99)
Potassium: 3.8 mmol/L (ref 3.5–5.1)
Sodium: 139 mmol/L (ref 135–145)

## 2021-10-17 LAB — CBC
HCT: 35.8 % — ABNORMAL LOW (ref 39.0–52.0)
Hemoglobin: 11.5 g/dL — ABNORMAL LOW (ref 13.0–17.0)
MCH: 28.3 pg (ref 26.0–34.0)
MCHC: 32.1 g/dL (ref 30.0–36.0)
MCV: 88.2 fL (ref 80.0–100.0)
Platelets: 129 10*3/uL — ABNORMAL LOW (ref 150–400)
RBC: 4.06 MIL/uL — ABNORMAL LOW (ref 4.22–5.81)
RDW: 14.8 % (ref 11.5–15.5)
WBC: 4.1 10*3/uL (ref 4.0–10.5)
nRBC: 0 % (ref 0.0–0.2)

## 2021-10-17 LAB — CBG MONITORING, ED: Glucose-Capillary: 124 mg/dL — ABNORMAL HIGH (ref 70–99)

## 2021-10-17 LAB — MAGNESIUM: Magnesium: 1.8 mg/dL (ref 1.7–2.4)

## 2021-10-17 MED ORDER — TRAMADOL HCL 50 MG PO TABS
100.0000 mg | ORAL_TABLET | Freq: Three times a day (TID) | ORAL | Status: DC | PRN
  Administered 2021-10-17: 100 mg via ORAL
  Filled 2021-10-17: qty 2

## 2021-10-17 MED ORDER — SODIUM CHLORIDE 0.9 % IV SOLN
INTRAVENOUS | Status: AC
Start: 2021-10-17 — End: 2021-10-17

## 2021-10-17 NOTE — Progress Notes (Unsigned)
Enrolled for [employer] to mail a ZIO XT long term holter monitor to the patients address on file.   Dr. Haroldine Laws to read.

## 2021-10-17 NOTE — Progress Notes (Signed)
  Echocardiogram 2D Echocardiogram has been performed.  Roy Duke 10/17/2021, 10:05 AM

## 2021-10-17 NOTE — Discharge Summary (Addendum)
Discharge Summary    Patient ID: Roy Duke MRN: [id number]; DOB: 10/14/1950  Admit date: 10/16/2021 Discharge date: 10/17/2021  PCP:  [name], MD   [facility] Providers Cardiologist:  None  Advanced Heart Failure:  Glori Bickers, MD     Discharge Diagnoses    Principal Problem:   Pre-syncope Active Problems:   COPD not affecting current episode of care [facility])   Diabetes mellitus ([place])   Essential hypertension   Hypercholesterolemia   Congestive heart failure ([place])   Frequent PVCs   Diagnostic Studies/Procedures    Echocardiogram 10/17/21   1. Left ventricular ejection fraction, by estimation, is 45 to 50%. The  left ventricle has mildly decreased function. The left ventricle  demonstrates global hypokinesis. The left ventricular internal cavity size  was mildly dilated. Left ventricular  diastolic parameters are consistent with Grade I diastolic dysfunction  (impaired relaxation).   2. Right ventricular systolic function is normal. The right ventricular  size is normal. Tricuspid regurgitation signal is inadequate for assessing  PA pressure.   3. Left atrial size was mildly dilated.   4. The mitral valve is normal in structure. No evidence of mitral valve  regurgitation. No evidence of mitral stenosis.   5. The aortic valve is tricuspid. There is mild calcification of the  aortic valve. Aortic valve regurgitation is not visualized. No aortic  stenosis is present.   6. The inferior vena cava is normal in size with greater than 50%  respiratory variability, suggesting right atrial pressure of 3 mmHg.  _____________   History of Present Illness     Roy Duke is a 71 y.o. 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  Per chart review, patient has been followed  by Dr. Haroldine Laws.  Had an echocardiogram in 04/2020 that showed EF 40-98%, grade 1 diastolic dysfunction, mildly reduced RV systolic function.  Follow-up cardiac MRI in 06/2020 showed a mildly dilated left ventricle with mild LVH, moderately decreased systolic function (LVEF 11%).  Most recent echocardiogram completed 12/2020 that showed EF had improved to 91-47%, grade 1 diastolic dysfunction, moderately dilated left atrium and right atrium.    Patient was last seen by Dr. Haroldine Laws in March 2023.  At that visit, patient did not have any notable complaints.  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  Patient presented to the ED on 7/12 complaining of a few months of progressive fatigue.  He also noted shortness of breath with exertion that improved with rest.  Complained of often waking up with significant feeling of doom that tended to improve as the day went on.  Also reported intermittent fluttering sensation in his chest and left arm.  These palpitations could last for a few days at a time.  The palpitations are not necessarily tied to his episodes of fatigue.  Most recently, patient has noticed significant episodes of dizziness in the past week.  He notes they have been bad enough that he felt as though he was going to pass out.   Hospital Course     Consultants: None   Presyncope  Palpitations  PVCs  - Patient has had a high PVC burden in the past (16.5% on monitor in 07/2021) - Patient was seen by Dr. Rayann Heman in 07/2021 after monitor resulted-- started amiodarone (has successfully suppressed PVCs in the past) - Now that patient is on amiodarone, I have ordered a 2 week monitor to reassess PVC burden and response to amiodarone. Monitor will also help  identify any possible arrhythmias contributing to patient's recent presyncope. Monitor will be mailed to patient's home - Continue carvedilol 6.25 mg daily   Chronic combined systolic and diastolic CHF  - Most recent echocardiogram from 12/2020 showed EF had improved to 50-55% - Echocardiogram this admission showed EF 23-76%, grade I diastolic dysfunction, normal RV systolic function  - Volume status normal, BNP 22.6 - Continue carvedilol 6.25 mg bid - Continue Entresto 97/103 bid - Continue spironolactone 25 mg daily.  - Continue Farxiga 10 - Continue PRN torsemide   HTN  - BP overall very well controlled this admission  - Continue amlodipine 5 mg daily, carvedilol 6.25 mg BID, entresto 97-103 mg BID, aldactone 25 mg daily   HLD  - Continue lipitor 40 mg nightly   Type 2 DM  - Continue metformin and farxiga  - Follow with PCP  Elevated Creatinine - Resolved  - Creatinine was elevated to 1.26 on admission, improved to 0.89 after 1L NS    Did the patient have an acute coronary syndrome (MI, NSTEMI, STEMI, etc) this admission?:  No                               Did the patient have a percutaneous coronary intervention (stent / angioplasty)?:  No.    Patient was seen and examined by Dr. Harriet Masson and deemed stable for discharge   I have sent a message to out office staff to arrange an outpatient follow up appointment within the next month.        _____________  Discharge Vitals Blood pressure 120/75, pulse 64, temperature 98.2 F (36.8 C), temperature source Oral, resp. rate 15, height '5\' 10"'$  (1.778 m), weight 79.4 kg, SpO2 96 %.  Filed Weights   10/16/21 1244  Weight: 79.4 kg    Labs & Radiologic Studies    CBC Recent Labs    10/16/21 1320 10/17/21 0343  WBC 4.2 4.1  NEUTROABS 3.1  --   HGB 11.9* 11.5*  HCT 36.4* 35.8*  MCV 89.4 88.2  PLT 150 283*   Basic Metabolic Panel Recent Labs    10/16/21 1320 10/17/21 0343  NA 139 139  K 4.3 3.8  CL 105 105  CO2 25  25  GLUCOSE 165* 115*  BUN 15 10  CREATININE 1.26* 0.89  CALCIUM 9.1 8.8*  MG 1.9 1.8   Liver Function Tests Recent Labs    10/16/21 1320  AST 25  ALT 24  ALKPHOS 43  BILITOT 0.4  PROT 6.5  ALBUMIN 4.0   No results for input(s): "LIPASE", "AMYLASE" in the last 72 hours. High Sensitivity Troponin:   Recent Labs  Lab 10/16/21 1320 10/16/21 1954  TROPONINIHS 4 5    BNP Invalid input(s): "POCBNP" D-Dimer No results for input(s): "DDIMER" in the last 72 hours. Hemoglobin A1C No results for input(s): "HGBA1C" in the last 72 hours. Fasting Lipid Panel No results for input(s): "CHOL", "HDL", "LDLCALC", "TRIG", "CHOLHDL", "LDLDIRECT" in the last 72 hours. Thyroid Function Tests Recent Labs    10/16/21 1320  TSH  2.668   _____________  ECHOCARDIOGRAM COMPLETE  Result Date: 10/17/2021    ECHOCARDIOGRAM REPORT   Patient Name:   Roy Duke Date of Exam: 10/17/2021 Medical Rec #:  [id number]         Height:       70.0 in Accession #:    [id number]        Weight:       175.0 lb Date of Birth:  August 02, 1950         BSA:          1.972 m Patient Age:    31 years          BP:           150/103 mmHg Patient Gender: M                 HR:           60 bpm. Exam Location:  Inpatient Procedure: 2D Echo, Cardiac Doppler and Color Doppler Indications:     Other abnormalities of the heart R00.8  History:         Patient has prior history of Echocardiogram examinations, most                  recent 05/26/2019. CHF, COPD, Signs/Symptoms:Shortness of                  Breath; Risk Factors:Diabetes and Hypertension.  Sonographer:     Ronny Flurry Referring Phys:  [id number] [place] Diagnosing Phys: Franki Monte IMPRESSIONS  1. Left ventricular ejection fraction, by estimation, is 45 to 50%. The left ventricle has mildly decreased function. The left ventricle demonstrates global hypokinesis. The left ventricular internal cavity size was mildly dilated. Left ventricular diastolic parameters  are consistent with Grade I diastolic dysfunction (impaired relaxation).  2. Right ventricular systolic function is normal. The right ventricular size is normal. Tricuspid regurgitation signal is inadequate for assessing PA pressure.  3. Left atrial size was mildly dilated.  4. The mitral valve is normal in structure. No evidence of mitral valve regurgitation. No evidence of mitral stenosis.  5. The aortic valve is tricuspid. There is mild calcification of the aortic valve. Aortic valve regurgitation is not visualized. No aortic stenosis is present.  6. The inferior vena cava is normal in size with greater than 50% respiratory variability, suggesting right atrial pressure of 3 mmHg. FINDINGS  Left Ventricle: Left ventricular ejection fraction, by estimation, is 45 to 50%. The left ventricle has mildly decreased function. The left ventricle demonstrates global hypokinesis. The left ventricular internal cavity size was mildly dilated. There is  no left ventricular hypertrophy. Left ventricular diastolic parameters are consistent with Grade I diastolic dysfunction (impaired relaxation). Right Ventricle: The right ventricular size is normal. No increase in right ventricular wall thickness. Right ventricular systolic function is normal. Tricuspid regurgitation signal is inadequate for assessing PA pressure. Left Atrium: Left atrial size was mildly dilated. Right Atrium: Right atrial size was normal in size. Pericardium: There is no evidence of pericardial effusion. Mitral Valve: The mitral valve is normal in structure. No evidence of mitral valve regurgitation. No evidence of mitral valve stenosis. Tricuspid Valve: The tricuspid valve is normal in structure. Tricuspid valve regurgitation is trivial. Aortic Valve: The aortic valve is tricuspid. There is mild calcification of the aortic valve. Aortic valve regurgitation is not visualized. No aortic stenosis is present. Pulmonic Valve: The pulmonic valve was normal in  structure. Pulmonic valve regurgitation is not  visualized. Aorta: The aortic root is normal in size and structure. Venous: The inferior vena cava is normal in size with greater than 50% respiratory variability, suggesting right atrial pressure of 3 mmHg. IAS/Shunts: No atrial level shunt detected by color flow Doppler.  LEFT VENTRICLE PLAX 2D LVIDd:         5.10 cm   Diastology LVIDs:         5.90 cm   LV e' medial:    6.63 cm/s LV PW:         1.50 cm   LV E/e' medial:  8.3 LV IVS:        1.30 cm   LV e' lateral:   7.29 cm/s LVOT diam:     2.30 cm   LV E/e' lateral: 7.6 LV SV:         76 LV SV Index:   39 LVOT Area:     4.15 cm  RIGHT VENTRICLE RV S prime:     14.10 cm/s TAPSE (M-mode): 2.6 cm LEFT ATRIUM             Index        RIGHT ATRIUM           Index LA diam:        3.30 cm 1.67 cm/m   RA Area:     11.50 cm LA Vol (A2C):   58.9 ml 29.87 ml/m  RA Volume:   24.50 ml  12.42 ml/m LA Vol (A4C):   44.1 ml 22.36 ml/m LA Biplane Vol: 53.6 ml 27.18 ml/m  AORTIC VALVE LVOT Vmax:   88.60 cm/s LVOT Vmean:  67.500 cm/s LVOT VTI:    0.184 m  AORTA Ao Root diam: 3.40 cm Ao Asc diam:  3.30 cm MITRAL VALVE MV Area (PHT): 3.27 cm    SHUNTS MV Decel Time: 232 msec    Systemic VTI:  0.18 m MV E velocity: 55.10 cm/s  Systemic Diam: 2.30 cm MV A velocity: 63.40 cm/s MV E/A ratio:  0.87 Dalton McleanMD Electronically signed by Franki Monte Signature Date/Time: 10/17/2021/10:12:01 AM    Final (Updated)    DG Chest 2 View  Result Date: 10/16/2021 CLINICAL DATA:  Generalized weakness. EXAM: CHEST - 2 VIEW COMPARISON:  12/15/2019 FINDINGS: The heart size and mediastinal contours are within normal limits. Both lungs are clear. The visualized skeletal structures are unremarkable. IMPRESSION: No active cardiopulmonary disease. Electronically Signed   By: Kathreen Devoid M.D.   On: 10/16/2021 13:31    Disposition   Pt is being discharged home today in good condition.  Follow-up Plans & Appointments     Discharge  Instructions     Diet - low sodium heart healthy   Complete by: As directed    Increase activity slowly   Complete by: As directed        Discharge Medications   Allergies as of 10/17/2021       Reactions   Benadryl [diphenhydramine] Other (See Comments)   Bad dreams   Codeine Nausea Only   "makes me feel weird"   Penicillins Other (See Comments)   States a long time ago, does not recall reaction         Medication List     TAKE these medications    albuterol 108 (90 Base) MCG/ACT inhaler Commonly known as: VENTOLIN HFA Inhale 2 puffs into the lungs every 4 (four) hours as needed for wheezing.   albuterol 0.63 MG/3ML nebulizer solution Commonly known as: ACCUNEB  Take 1 ampule by nebulization every 4 (four) hours as needed for shortness of breath.   amiodarone 200 MG tablet Commonly known as: PACERONE Take 1 tablet (200 mg total) by mouth 2 (two) times daily.   amLODipine 5 MG tablet Commonly known as: NORVASC Take 1 tablet by mouth once daily   aspirin EC 81 MG tablet Take 1 tablet (81 mg total) by mouth daily.   atorvastatin 40 MG tablet Commonly known as: LIPITOR Take 40 mg by mouth daily at 6 PM.   carvedilol 6.25 MG tablet Commonly known as: COREG Take 1 tablet by mouth twice daily   cholecalciferol 25 MCG (1000 UNIT) tablet Commonly known as: VITAMIN D3 Take 1,000 Units by mouth daily.   dapagliflozin propanediol 10 MG Tabs tablet Commonly known as: Farxiga Take 1 tablet (10 mg total) by mouth daily before breakfast.   DUPIXENT [place] Inject 1 Dose into the skin every 14 (fourteen) days.   Entresto 97-103 MG Generic drug: sacubitril-valsartan Take 1 tablet by mouth twice daily   metFORMIN 500 MG tablet Commonly known as: GLUCOPHAGE Take 500 mg by mouth 2 (two) times daily with a meal.   omeprazole 40 MG capsule Commonly known as: PRILOSEC Take 40 mg by mouth daily.   spironolactone 25 MG tablet Commonly known as: ALDACTONE TAKE 1  TABLET BY MOUTH AT BEDTIME   torsemide 20 MG tablet Commonly known as: DEMADEX Take 1 tablet (20 mg total) by mouth daily as needed (for weight gain).   traMADol 100 MG 24 hr tablet Commonly known as: ULTRAM-ER Take 100 mg by mouth daily as needed for pain.   vitamin C 500 MG tablet Commonly known as: ASCORBIC ACID Take 500 mg by mouth daily.           Outstanding Labs/Studies    Duration of Discharge Encounter   Greater than 30 minutes including physician time. Signed, Margie Billet, PA-C 10/17/2021, 12:32 PM  Patient seen and examined, note reviewed with the signed Advanced Practice Provider. I personally reviewed laboratory data, imaging studies and relevant notes. I independently examined the patient and formulated the important aspects of the plan. I have personally discussed the plan with the patient and/or family. Comments or changes to the note/plan are indicated below.  Patient is stable for discharge.   Berniece Salines DO, MS [facility] Attending Cardiologist [place]  [address] [place], [place] [address] [phone] Website: [email or web address]

## 2021-10-20 DIAGNOSIS — R55 Syncope and collapse: Secondary | ICD-10-CM

## 2021-10-20 DIAGNOSIS — I493 Ventricular premature depolarization: Secondary | ICD-10-CM | POA: Diagnosis not present

## 2021-11-11 ENCOUNTER — Other Ambulatory Visit (HOSPITAL_COMMUNITY): Payer: Self-pay | Admitting: Internal Medicine

## 2021-11-14 ENCOUNTER — Encounter: Payer: Self-pay | Admitting: Gastroenterology

## 2021-11-20 NOTE — Progress Notes (Incomplete)
Advanced HF Clinic Note   Date:  11/21/2021   ID:  Roy Duke, DOB 1950-12-30, MRN [id number]  Location: Home  Provider location: [facility] Type of Visit: Established patient  PCP:  [name], MD  Cardiologist:  None Primary HF: [place]  Chief Complaint: hospital follow-up   History of Present Illness:  Roy Duke is a 71 y/o male with HTN, HL, DM2, former tobacco, ETOH use (quit 30 years ago) and systolic HF due to NICM.  Admitted 12/20 to [facility] with severe hyponatremia and HF.  Echo EF 25%. Developed NSVT with polymorphic ectopy. ECG with RBBB and new lateral TWI. He was transferred to [facility].   LHC w/ mild, nonobstructive CAD. Well compensated hemodynamics on RHC. cMRi EF 28% w/ nonspecific inferoseptal RV insertion site LGE. ECV percentage elevated at 39%. 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  He was started on amiodarone for suppression of PVCs and fitted w/ a LifeVest prior to discharge.    Sleep study 3/21 AHI 2.3   Echo 5/21: EF 40-45%  Echo 1/22 EF 35-40% RV mildly HK. Personally reviewed   cMRI 3/22 - repeated due to persistent LV dysfunction despite PVC suppression 1. LVEF 40% ECV = 38%.Midwall LGE in the basal anterior and anteroseptal walls, 2. RVEF 43%  3. When compared to the prior study from 05/29/2019, there is decrease in size in both ventricles as well as improvement in biventricular systolic function. ECV is 38%. There are 2 segments of midwall nonspecific LGE that might be associated with idiopathic NICM. Consider sarcoid   Echo (12/28/20): EF 50-55% Personally reviewed  Amio stopped in 09/22.  Cardiac monitor 04/23: 16.5% PVC burden. He was unable to tolerate mexiletine and was started on amiodarone. Saw Dr. Rayann Heman and PVC ablation to be considered if PVC burden did not improve with amiodarone.  Admitted overnight on 10/16/21 with fatigue dyspnea, palpitations and  presyncope. Echo with EF 45-50%, RV okay. Volume okay. 14 day monitor placed to reassess PVC burden and identify any arrhythmias.  14 day zio 07/23: SR with < 1% PVC burden  He is here today for f/u. Accompanied by his wife. Has been dealing with a lot of fatigue, intrusive thoughts and anxiety first thing in am. This tends to improve throughout the day but very bothersome. Has been started on Lexapro by PCP. Notes some recent BPs down to 174B systolic.  Wonders if this may be contributing to fatigue. Still working at [employer] about 32 hrs a week. Performing some strenuous labor. Not limited by any dyspnea. No orthopnea, PND or LE edema. Has occasional palpitations, correlated with NSR on recent Zio.  Cardiac Studies   Echo 07/23 IMPRESSIONS   1. Left ventricular ejection fraction, by estimation, is 45 to 50%. The  left ventricle has mildly decreased function. The left ventricle  demonstrates global hypokinesis. The left ventricular internal cavity size  was mildly dilated. Left ventricular  diastolic parameters are consistent with Grade I diastolic dysfunction  (impaired relaxation).   2. Right ventricular systolic function is normal. The right ventricular  size is normal. Tricuspid regurgitation signal is inadequate for assessing  PA pressure.   3. Left atrial size was mildly dilated.   4. The mitral valve is normal in structure. No evidence of mitral valve  regurgitation. No evidence of mitral stenosis.   5. The aortic valve is tricuspid. There is mild calcification of the  aortic valve.  Aortic valve regurgitation is not visualized. No aortic  stenosis is present.   6. The inferior vena cava is normal in size with greater than 50%  respiratory variability, suggesting right atrial pressure of 3 mmHg.   2D echo 05/2019 Left ventricular ejection fraction, by estimation, is 20 to 25%. The left ventricle has severely decreased function. The left ventricle demonstrates global  hypokinesis. The left ventricular internal cavity size was moderately dilated. Left ventricular diastolic parameters are indeterminate. 2. Right ventricular systolic function is mildly reduced. The right ventricular size is normal. There is normal pulmonary artery systolic pressure. The estimated right ventricular systolic pressure is 14.4 mmHg. 3. Left atrial size was moderately dilated. 4. The mitral valve is normal in structure and function. Mild mitral valve regurgitation. No evidence of mitral stenosis. 5. The aortic valve is tricuspid. Aortic valve regurgitation is not visualized. No aortic stenosis is present. 6. The inferior vena cava is normal in size with greater than 50% respiratory variability, suggesting right atrial pressure of 3 mmHg.     [facility] 05/26/19 Ost LAD to Prox LAD lesion is 20% stenosed.   Findings:   Ao = 100/72 (86) LV = 103/14 RA =  2 RV = 27/4 PA = 32/12 (22) PCW = 14 Fick cardiac output/index = 6.5/3.3 PVR = 1.2 WU FA sat = 99% PA sat = 75%, 76%   Assessment: 1. Minimal CAD  2. Severe NICM EF 15% 3. Well-compensated hemodynamics   cMRi 05/26/19 IMPRESSION: 1.  Severely dilated LV with EF 18%, diffuse hypokinesis.   2.  Moderately dilated RV with EF 28%.   3. Nonspecific inferoseptal RV insertion site LGE, can be suggestive of pressure overload/CHF.   4. ECV percentage elevated at 39%, can see with increased fibrosis with cardiomyopathy or myocarditis. Generally amyloidosis tends to be >40%.       Past Medical History:  Diagnosis Date   CHF (congestive heart failure) (HCC)    Diabetes mellitus without complication (HCC)    Hypertension    NSVT (nonsustained ventricular tachycardia) (HCC)    PVC's (premature ventricular contractions)    Past Surgical History:  Procedure Laterality Date   RIGHT/LEFT HEART CATH AND CORONARY ANGIOGRAPHY N/A 05/26/2019   Procedure: RIGHT/LEFT HEART CATH AND CORONARY ANGIOGRAPHY;  Surgeon: [name], MD;  Location: [place] [facility];  Service: Cardiovascular;  Laterality: N/A;     Current Outpatient Medications  Medication Sig Dispense Refill   amiodarone (PACERONE) 200 MG tablet Take 1 tablet (200 mg total) by mouth 2 (two) times daily. 60 tablet 3   amLODipine (NORVASC) 5 MG tablet Take 1 tablet by mouth once daily 90 tablet 3   aspirin EC 81 MG EC tablet Take 1 tablet (81 mg total) by mouth daily. 90 tablet 0   atorvastatin (LIPITOR) 40 MG tablet Take 40 mg by mouth daily at 6 PM.      carvedilol (COREG) 6.25 MG tablet Take 1 tablet by mouth twice daily 180 tablet 3   cholecalciferol (VITAMIN D3) 25 MCG (1000 UNIT) tablet Take 1,000 Units by mouth daily.     dapagliflozin propanediol (FARXIGA) 10 MG TABS tablet Take 1 tablet (10 mg total) by mouth daily before breakfast. 90 tablet 3   Dupilumab (DUPIXENT [place]) Inject 1 Dose into the skin every 14 (fourteen) days.     escitalopram (LEXAPRO) 10 MG tablet Take 5 mg by mouth daily.     metFORMIN (GLUCOPHAGE) 500 MG tablet Take 500 mg by mouth 2 (  two) times daily with a meal.      omeprazole (PRILOSEC) 40 MG capsule Take 40 mg by mouth daily.     spironolactone (ALDACTONE) 25 MG tablet TAKE 1 TABLET BY MOUTH AT BEDTIME 90 tablet 3   torsemide (DEMADEX) 20 MG tablet Take 1 tablet (20 mg total) by mouth daily as needed (for weight gain). 30 tablet 1   traMADol (ULTRAM-ER) 100 MG 24 hr tablet Take 100 mg by mouth daily as needed for pain.     vitamin C (ASCORBIC ACID) 500 MG tablet Take 500 mg by mouth daily.     No current facility-administered medications for this encounter.    Allergies:   Benadryl [diphenhydramine], Codeine, and Penicillins   Social History:  The patient  reports that he quit smoking about 32 years ago. His smoking use included cigarettes. He has never used smokeless tobacco. He reports that he does not currently use alcohol. He reports that he does not currently use drugs.   Family History:  The patient's  family history includes CAD in his paternal uncle; Heart attack in his paternal grandfather; Heart disease in his father.   ROS:  Please see the history of present illness.   All other systems are personally reviewed and negative.   Vitals:   11/21/21 0924  BP: (!) 100/50  Pulse: 62  SpO2: 97%  Weight: 73.1 kg (161 lb 3.2 oz)      Exam:   General:  Well appearing.  HEENT: normal Neck: supple. no JVD. Carotids 2+ bilat; no bruits.  Cor: PMI nondisplaced. Regular rate & rhythm. No rubs, gallops or murmurs. Lungs: clear Abdomen: soft, nontender, nondistended.  Extremities: no cyanosis, clubbing, rash, edema Neuro: alert & orientedx3, cranial nerves grossly intact. moves all 4 extremities w/o difficulty. Affect pleasant   ECG: SR 60 bpm, RBBB   10/16/2021: ALT 24; B Natriuretic Peptide 22.6; TSH 2.668 10/17/2021: BUN 10; Creatinine, Ser 0.89; Hemoglobin 11.5; Magnesium 1.8; Platelets 129; Potassium 3.8; Sodium 139  Personally reviewed   Wt Readings from Last 3 Encounters:  11/21/21 73.1 kg (161 lb 3.2 oz)  10/16/21 79.4 kg (175 lb)  07/19/21 79.6 kg (175 lb 6.4 oz)      ASSESSMENT AND PLAN:  1. Chronic Systolic Heart Failure (NICM) -Echo @ [place] 2/21 EF of 25 to 30% with severe global hypokinesis.  RV also noted to be moderately enlarged with moderately impaired RV systolic function. -LHC 05/26/19 w/ mild, nonobstructive CAD. RHC w/ compensated hemodynamics  - cMRI 2/21 EF 18% w/ nonspecific inferoseptal RV insertion site LGE. ECV percentage elevated at 39%, can see w/ increased fibrosis with cardiomyopathy or myocarditis. Suspect PVC also contributing. PVCs now suppressed with amio.  - Echo 08/29/19 EF 40-45% (improved with PVC suppression) - Echo 04/24/20 EF 35-40% RV mildly HK. - cMRI 3/22 LVEF 40% RVEF 43% mild LGE ECV 38%  - Echo 12/28/20 EF 50-55%  - Echo 07/23: EF 45-50%, RV okay - Stable NYHA I-II Volume status ok Continue PRN torsemide.  - Continue carvedilol 6.25  mg bid - Will cut back Entresto to 49/51 mg BID d/t recent fatigue and soft blood pressures. Pharmacy tech will look into options for Entresto, currently in donut hole. - Continue spironolactone 25 mg daily.  - Continue Farxiga 10 - Based on improvement in cMRI, I suspect main issue was resolving myocarditis vs PVCs cardiomyopathy. Doubt sarcoid as EF unlikely to get better without steroid treatment - Labs today  2. PVCs - Zio 2/21 burden ~  8-10% - PVCs suppressed with amio but EF has not normalized - Sleep study negative - Zio 2/22 Rare PVCs (<1%) - Amio stopped 9/22 -Zio 04/23: 16.5% PVC burden - Unable to tolerate mexiletine. Now on amiodarone. TSH normal 07/23. Check LFTs today. - 14 day zio 07/23: < 1% PVC burden -  Saw Dr. Rayann Heman 04/23. Ablation to be considered if failed AAD. Patient preferred medical therapy. Missed appt to establish with Dr. Curt Bears in July. Will refer back to EP.   3. Hypertension - Blood pressure well controlled. Low at times.  - See changes above  4. Hyperlipidemia - Continue Lipitor '40mg'$  qhs. General Cardiology to continue to follow.   5. Type 2 Diabetes Mellitus - Continue Metformin & Farxiga  - Followed by PCP.  6. COPD - PFTs 9/21 Mild obstruction FEV1 2.55L (77%)  7. Fatigue - sounds like OSA but HST normal in 3/21 - TSH normal 07/23 - Believe anxiety and depression may be contributing. Planning to follow-up with PCP. On Lexapro. - Cut back Entresto d/t low BP   Follow-up: Dr. Haroldine Laws as previously scheduled next month  Signed, Christiane Ha  11/21/2021 10:16 AM  Advanced Heart Failure [place] [address] [phone] (office) [phone] (fax)

## 2021-11-21 ENCOUNTER — Encounter (HOSPITAL_COMMUNITY): Payer: Self-pay

## 2021-11-21 ENCOUNTER — Telehealth (HOSPITAL_COMMUNITY): Payer: Self-pay | Admitting: Pharmacy Technician

## 2021-11-21 ENCOUNTER — Ambulatory Visit (HOSPITAL_COMMUNITY)
Admission: RE | Admit: 2021-11-21 | Discharge: 2021-11-21 | Disposition: A | Discharge status: Home / Self Care | Payer: Medicare HMO | Source: Ambulatory Visit | Attending: Physician Assistant | Admitting: Physician Assistant

## 2021-11-21 VITALS — BP 100/50 | HR 62 | Wt 161.2 lb

## 2021-11-21 DIAGNOSIS — I5022 Chronic systolic (congestive) heart failure: Secondary | ICD-10-CM

## 2021-11-21 DIAGNOSIS — I493 Ventricular premature depolarization: Secondary | ICD-10-CM

## 2021-11-21 DIAGNOSIS — Z7984 Long term (current) use of oral hypoglycemic drugs: Secondary | ICD-10-CM | POA: Diagnosis not present

## 2021-11-21 DIAGNOSIS — I4510 Unspecified right bundle-branch block: Secondary | ICD-10-CM | POA: Diagnosis not present

## 2021-11-21 DIAGNOSIS — E119 Type 2 diabetes mellitus without complications: Secondary | ICD-10-CM | POA: Insufficient documentation

## 2021-11-21 DIAGNOSIS — Z87891 Personal history of nicotine dependence: Secondary | ICD-10-CM | POA: Insufficient documentation

## 2021-11-21 DIAGNOSIS — F419 Anxiety disorder, unspecified: Secondary | ICD-10-CM | POA: Diagnosis not present

## 2021-11-21 DIAGNOSIS — J449 Chronic obstructive pulmonary disease, unspecified: Secondary | ICD-10-CM | POA: Diagnosis not present

## 2021-11-21 DIAGNOSIS — E871 Hypo-osmolality and hyponatremia: Secondary | ICD-10-CM | POA: Insufficient documentation

## 2021-11-21 DIAGNOSIS — I959 Hypotension, unspecified: Secondary | ICD-10-CM | POA: Insufficient documentation

## 2021-11-21 DIAGNOSIS — Z79899 Other long term (current) drug therapy: Secondary | ICD-10-CM | POA: Insufficient documentation

## 2021-11-21 DIAGNOSIS — I2510 Atherosclerotic heart disease of native coronary artery without angina pectoris: Secondary | ICD-10-CM | POA: Diagnosis not present

## 2021-11-21 DIAGNOSIS — I110 Hypertensive heart disease with heart failure: Secondary | ICD-10-CM | POA: Diagnosis present

## 2021-11-21 DIAGNOSIS — I10 Essential (primary) hypertension: Secondary | ICD-10-CM

## 2021-11-21 DIAGNOSIS — F32A Depression, unspecified: Secondary | ICD-10-CM | POA: Diagnosis not present

## 2021-11-21 DIAGNOSIS — I472 Ventricular tachycardia, unspecified: Secondary | ICD-10-CM | POA: Diagnosis not present

## 2021-11-21 DIAGNOSIS — I428 Other cardiomyopathies: Secondary | ICD-10-CM

## 2021-11-21 DIAGNOSIS — R5383 Other fatigue: Secondary | ICD-10-CM

## 2021-11-21 DIAGNOSIS — E785 Hyperlipidemia, unspecified: Secondary | ICD-10-CM | POA: Insufficient documentation

## 2021-11-21 LAB — COMPREHENSIVE METABOLIC PANEL
ALT: 37 U/L (ref 0–44)
AST: 36 U/L (ref 15–41)
Albumin: 3.9 g/dL (ref 3.5–5.0)
Alkaline Phosphatase: 45 U/L (ref 38–126)
Anion gap: 8 (ref 5–15)
BUN: 15 mg/dL (ref 8–23)
CO2: 24 mmol/L (ref 22–32)
Calcium: 9.1 mg/dL (ref 8.9–10.3)
Chloride: 105 mmol/L (ref 98–111)
Creatinine, Ser: 1.02 mg/dL (ref 0.61–1.24)
GFR, Estimated: >60 mL/min (ref >60)
Glucose, Bld: 118 mg/dL — ABNORMAL HIGH (ref 70–99)
Potassium: 4.6 mmol/L (ref 3.5–5.1)
Sodium: 137 mmol/L (ref 135–145)
Total Bilirubin: 0.4 mg/dL (ref 0.3–1.2)
Total Protein: 6.3 g/dL — ABNORMAL LOW (ref 6.5–8.1)

## 2021-11-21 LAB — BRAIN NATRIURETIC PEPTIDE: B Natriuretic Peptide: 28.3 pg/mL (ref 0.0–100.0)

## 2021-11-21 MED ORDER — ENTRESTO 49-51 MG PO TABS: 1.0000 | ORAL_TABLET | Freq: Two times a day (BID) | ORAL | 11 refills | Status: DC

## 2021-11-21 NOTE — Patient Instructions (Addendum)
Decrease Entresto to 49/51 Twice daily  Labs done today, your results will be available in MyChart, we will contact you for abnormal readings.   You have been referred to Dr. Tomasa Blase. His office will call you to schedule the appointment.  Keep follow up appointment with Dr. Haroldine Laws as scheduled   If you have any questions or concerns before your next appointment please send [place] a message through [facility] or call our office at [phone].    TO LEAVE A MESSAGE FOR THE NURSE SELECT OPTION 2, PLEASE LEAVE A MESSAGE INCLUDING: YOUR NAME DATE OF BIRTH CALL BACK NUMBER REASON FOR CALL**this is important as we prioritize the call backs  YOU WILL RECEIVE A CALL BACK THE SAME DAY AS LONG AS YOU CALL BEFORE 4:00 PM  At the [facility], you and your health needs are our priority. As part of our continuing mission to provide you with exceptional heart care, we have created designated Provider Care Teams. These Care Teams include your primary Cardiologist (physician) and Advanced Practice Providers (APPs- Physician Assistants and Nurse Practitioners) who all work together to provide you with the care you need, when you need it.   You may see any of the following providers on your designated Care Team at your next follow up: Dr Glori Bickers Dr Haynes Kerns, NP Lyda Jester, [place] [facility] [place], [place] Audry Riles, PharmD   Please be sure to bring in all your medications bottles to every appointment.

## 2021-11-21 NOTE — Telephone Encounter (Signed)
Advanced Heart Failure Patient Advocate Encounter  The patient was approved for a [place] that will help cover the cost of Entresto. Total amount awarded, $10,000. Eligibility, 10/22/21 - 10/21/22.  ID [id number]  BIN [id number]  PCN [id number]  Group [id number]  Patient was given a copy of the grant information to take to the pharmacy.  Charlann Boxer, CPhT

## 2021-11-27 ENCOUNTER — Other Ambulatory Visit (HOSPITAL_COMMUNITY): Payer: Self-pay | Admitting: *Deleted

## 2021-11-27 MED ORDER — DAPAGLIFLOZIN PROPANEDIOL 10 MG PO TABS: 10.0000 mg | ORAL_TABLET | Freq: Every day | ORAL | 3 refills | Status: DC

## 2021-12-01 ENCOUNTER — Other Ambulatory Visit (HOSPITAL_COMMUNITY): Payer: Self-pay | Admitting: Internal Medicine

## 2021-12-11 ENCOUNTER — Other Ambulatory Visit (HOSPITAL_COMMUNITY): Payer: Self-pay

## 2021-12-11 DIAGNOSIS — I5022 Chronic systolic (congestive) heart failure: Secondary | ICD-10-CM

## 2021-12-11 NOTE — Progress Notes (Signed)
Orders Placed This Encounter  Procedures   ECHOCARDIOGRAM COMPLETE    Standing Status:   Future    Standing Expiration Date:   12/12/2022    Order Specific Question:   Where should this test be performed    Answer:   [place]    Order Specific Question:   Perflutren DEFINITY (image enhancing agent) should be administered unless hypersensitivity or allergy exist    Answer:   Administer Perflutren    Order Specific Question:   Reason for exam-Echo    Answer:   Congestive Heart Failure  I50.9    Order Specific Question:   Release to patient    Answer:   Immediate    

## 2021-12-13 ENCOUNTER — Ambulatory Visit (AMBULATORY_SURGERY_CENTER): Payer: Self-pay

## 2021-12-13 ENCOUNTER — Other Ambulatory Visit: Payer: Self-pay

## 2021-12-13 VITALS — Ht 70.0 in | Wt 167.0 lb

## 2021-12-13 DIAGNOSIS — Z86010 Personal history of colonic polyps: Secondary | ICD-10-CM

## 2021-12-13 MED ORDER — PLENVU 140 G PO SOLR: 1.0000 | Freq: Once | ORAL | 0 refills | Status: AC

## 2021-12-13 NOTE — Progress Notes (Signed)
Denies allergies to eggs or soy products. Denies complication of anesthesia or sedation. Denies use of weight loss medication. Denies use of O2.   Emmi instructions given for colonoscopy.  

## 2021-12-17 ENCOUNTER — Encounter: Payer: Self-pay | Admitting: Gastroenterology

## 2021-12-24 ENCOUNTER — Encounter: Payer: Self-pay | Admitting: Certified Registered Nurse Anesthetist

## 2021-12-26 ENCOUNTER — Encounter (HOSPITAL_COMMUNITY): Payer: Self-pay | Admitting: Internal Medicine

## 2021-12-26 ENCOUNTER — Ambulatory Visit (HOSPITAL_BASED_OUTPATIENT_CLINIC_OR_DEPARTMENT_OTHER)
Admission: RE | Admit: 2021-12-26 | Discharge: 2021-12-26 | Disposition: A | Discharge status: Home / Self Care | Payer: Medicare HMO | Source: Ambulatory Visit | Attending: Internal Medicine | Admitting: Internal Medicine

## 2021-12-26 ENCOUNTER — Ambulatory Visit (HOSPITAL_COMMUNITY)
Admission: RE | Admit: 2021-12-26 | Discharge: 2021-12-26 | Disposition: A | Discharge status: Home / Self Care | Payer: Medicare HMO | Source: Ambulatory Visit | Attending: Internal Medicine | Admitting: Internal Medicine

## 2021-12-26 VITALS — BP 122/70 | HR 54 | Wt 163.6 lb

## 2021-12-26 DIAGNOSIS — J449 Chronic obstructive pulmonary disease, unspecified: Secondary | ICD-10-CM | POA: Insufficient documentation

## 2021-12-26 DIAGNOSIS — I428 Other cardiomyopathies: Secondary | ICD-10-CM | POA: Insufficient documentation

## 2021-12-26 DIAGNOSIS — I493 Ventricular premature depolarization: Secondary | ICD-10-CM | POA: Diagnosis not present

## 2021-12-26 DIAGNOSIS — E119 Type 2 diabetes mellitus without complications: Secondary | ICD-10-CM | POA: Insufficient documentation

## 2021-12-26 DIAGNOSIS — I4510 Unspecified right bundle-branch block: Secondary | ICD-10-CM | POA: Insufficient documentation

## 2021-12-26 DIAGNOSIS — Z87891 Personal history of nicotine dependence: Secondary | ICD-10-CM | POA: Insufficient documentation

## 2021-12-26 DIAGNOSIS — I10 Essential (primary) hypertension: Secondary | ICD-10-CM

## 2021-12-26 DIAGNOSIS — E871 Hypo-osmolality and hyponatremia: Secondary | ICD-10-CM | POA: Diagnosis not present

## 2021-12-26 DIAGNOSIS — E785 Hyperlipidemia, unspecified: Secondary | ICD-10-CM | POA: Diagnosis not present

## 2021-12-26 DIAGNOSIS — I2510 Atherosclerotic heart disease of native coronary artery without angina pectoris: Secondary | ICD-10-CM | POA: Diagnosis not present

## 2021-12-26 DIAGNOSIS — I110 Hypertensive heart disease with heart failure: Secondary | ICD-10-CM | POA: Insufficient documentation

## 2021-12-26 DIAGNOSIS — I5022 Chronic systolic (congestive) heart failure: Secondary | ICD-10-CM | POA: Diagnosis not present

## 2021-12-26 DIAGNOSIS — Z7984 Long term (current) use of oral hypoglycemic drugs: Secondary | ICD-10-CM | POA: Insufficient documentation

## 2021-12-26 DIAGNOSIS — Z79899 Other long term (current) drug therapy: Secondary | ICD-10-CM | POA: Insufficient documentation

## 2021-12-26 LAB — ECHOCARDIOGRAM COMPLETE
Area-P 1/2: 1.68 cm2
S' Lateral: 3.1 cm

## 2021-12-26 MED ORDER — AMIODARONE HCL 200 MG PO TABS: 200.0000 mg | ORAL_TABLET | Freq: Every day | ORAL | 0 refills | Status: DC

## 2021-12-26 NOTE — Progress Notes (Signed)
Advanced HF Clinic Note   Date:  12/26/2021   ID:  Roy Duke, DOB 03/07/1951, MRN [id number]  Location: Home  Provider location: [facility] Type of Visit: Established patient  PCP:  Roy Nasuti, MD  Cardiologist:  None Primary HF: Roy Duke  Chief Complaint: hospital follow-up   History of Present Illness:  Roy Duke is a 71 y/o male with HTN, HL, DM2, former tobacco, ETOH use (quit 30 years ago) and systolic HF due to NICM.  Admitted 12/20 to [facility] with severe hyponatremia and HF.  Echo EF 25%. Developed NSVT with polymorphic ectopy. ECG with RBBB and new lateral TWI. He was transferred to [facility].   LHC w/ mild, nonobstructive CAD. Well compensated hemodynamics on RHC. cMRi EF 28% w/ nonspecific inferoseptal RV insertion site LGE. ECV percentage elevated at 39%. 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  He was started on amiodarone for suppression of PVCs and fitted w/ a LifeVest prior to discharge.    Sleep study 3/21 AHI 2.3   Echo 5/21: EF 40-45%  Echo 1/22 EF 35-40% RV mildly HK. Personally reviewed   cMRI 3/22 - repeated due to persistent LV dysfunction despite PVC suppression 1. LVEF 40% ECV = 38%.Midwall LGE in the basal anterior and anteroseptal walls, 2. RVEF 43%  3. When compared to the prior study from 05/29/2019, there is decrease in size in both ventricles as well as improvement in biventricular systolic function. ECV is 38%. There are 2 segments of midwall nonspecific LGE that might be associated with idiopathic NICM. Consider sarcoid   Echo (12/28/20): EF 50-55% Personally reviewed  Amio stopped in 09/22.  Cardiac monitor 04/23: 16.5% PVC burden. He was unable to tolerate mexiletine and was started on amiodarone. Saw Dr. Rayann Roy Duke and PVC ablation to be considered if PVC burden did not improve with amiodarone.  Admitted overnight on 10/16/21 with fatigue dyspnea, palpitations and  presyncope. Echo with EF 45-50%, RV okay. Volume okay. 14 day monitor placed to reassess PVC burden and identify any arrhythmias.  14 day zio 07/23: SR with < 1% PVC burden  He is here today for f/u. Accompanied by his wife. 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 Denies CP, SOB, orthopnea or PND. Struggles with stress and anxiety. On lexapro per PCP. Trying to limit tramadol   Echo today 12/26/21 EF 50-55% mild HK of mid to distal lateral.inferolateral walls G1DD Personally reviewed  Cardiac Studies   Echo 07/23 IMPRESSIONS   1. Left ventricular ejection fraction, by estimation, is 45 to 50%. The  left ventricle has mildly decreased function. The left ventricle  demonstrates global hypokinesis. The left ventricular internal cavity size  was mildly dilated. Left ventricular  diastolic parameters are consistent with Grade I diastolic dysfunction  (impaired relaxation).   2. Right ventricular systolic function is normal. The right ventricular  size is normal. Tricuspid regurgitation signal is inadequate for assessing  PA pressure.   3. Left atrial size was mildly dilated.   4. The mitral valve is normal in structure. No evidence of mitral valve  regurgitation. No evidence of mitral stenosis.   5. The aortic valve is tricuspid. There is mild calcification of the  aortic valve. Aortic valve regurgitation is not visualized. No aortic  stenosis is present.   6. The inferior vena cava is normal in size with greater than 50%  respiratory variability, suggesting right atrial pressure of 3  mmHg.   2D echo 05/2019 Left ventricular ejection fraction, by estimation, is 20 to 25%. The left ventricle has severely decreased function. The left ventricle demonstrates global hypokinesis. The left ventricular internal cavity size was moderately dilated. Left ventricular diastolic parameters are indeterminate. 2. Right ventricular systolic function is mildly reduced. The right  ventricular size is normal. There is normal pulmonary artery systolic pressure. The estimated right ventricular systolic pressure is 76.7 mmHg. 3. Left atrial size was moderately dilated. 4. The mitral valve is normal in structure and function. Mild mitral valve regurgitation. No evidence of mitral stenosis. 5. The aortic valve is tricuspid. Aortic valve regurgitation is not visualized. No aortic stenosis is present. 6. The inferior vena cava is normal in size with greater than 50% respiratory variability, suggesting right atrial pressure of 3 mmHg.     [facility] 05/26/19 Ost LAD to Prox LAD lesion is 20% stenosed.   Findings:   Ao = 100/72 (86) LV = 103/14 RA =  2 RV = 27/4 PA = 32/12 (22) PCW = 14 Fick cardiac output/index = 6.5/3.3 PVR = 1.2 WU FA sat = 99% PA sat = 75%, 76%   Assessment: 1. Minimal CAD  2. Severe NICM EF 15% 3. Well-compensated hemodynamics   cMRi 05/26/19 IMPRESSION: 1.  Severely dilated LV with EF 18%, diffuse hypokinesis.   2.  Moderately dilated RV with EF 28%.   3. Nonspecific inferoseptal RV insertion site LGE, can be suggestive of pressure overload/CHF.   4. ECV percentage elevated at 39%, can see with increased fibrosis with cardiomyopathy or myocarditis. Generally amyloidosis tends to be >40%.       Past Medical History:  Diagnosis Date   Anxiety    Asthma    CHF (congestive heart failure) ([place])    Diabetes mellitus without complication (HCC)    GERD (gastroesophageal reflux disease)    Hyperlipidemia    Hypertension    NSVT (nonsustained ventricular tachycardia) (HCC)    PVC's (premature ventricular contractions)    Substance abuse ([place])    Past Surgical History:  Procedure Laterality Date   INGUINAL HERNIA REPAIR Bilateral    RIGHT/LEFT HEART CATH AND CORONARY ANGIOGRAPHY N/A 05/26/2019   Procedure: RIGHT/LEFT HEART CATH AND CORONARY ANGIOGRAPHY;  Surgeon: [name], MD;  Location: [facility];  Service:  Cardiovascular;  Laterality: N/A;     Current Outpatient Medications  Medication Sig Dispense Refill   amiodarone (PACERONE) 200 MG tablet Take 1 tablet by mouth twice daily 180 tablet 3   amLODipine (NORVASC) 5 MG tablet Take 1 tablet by mouth once daily 90 tablet 3   aspirin EC 81 MG EC tablet Take 1 tablet (81 mg total) by mouth daily. 90 tablet 0   atorvastatin (LIPITOR) 40 MG tablet Take 40 mg by mouth daily at 6 PM.      carvedilol (COREG) 6.25 MG tablet Take 1 tablet by mouth twice daily 180 tablet 3   cholecalciferol (VITAMIN D3) 25 MCG (1000 UNIT) tablet Take 1,000 Units by mouth daily.     dapagliflozin propanediol (FARXIGA) 10 MG TABS tablet Take 1 tablet (10 mg total) by mouth daily before breakfast. 90 tablet 3   Dupilumab (DUPIXENT [place]) Inject 1 Dose into the skin every 14 (fourteen) days.     escitalopram (LEXAPRO) 10 MG tablet Take 7.5 mg by mouth daily.     metFORMIN (GLUCOPHAGE) 500 MG tablet Take 500 mg by mouth 2 (two) times daily with a meal.  omeprazole (PRILOSEC) 40 MG capsule Take 40 mg by mouth daily.     sacubitril-valsartan (ENTRESTO) 49-51 MG Take 1 tablet by mouth 2 (two) times daily. 60 tablet 11   spironolactone (ALDACTONE) 25 MG tablet TAKE 1 TABLET BY MOUTH AT BEDTIME 90 tablet 3   torsemide (DEMADEX) 20 MG tablet Take 1 tablet (20 mg total) by mouth daily as needed (for weight gain). 30 tablet 1   traMADol (ULTRAM-ER) 100 MG 24 hr tablet Take 100 mg by mouth daily as needed for pain.     vitamin C (ASCORBIC ACID) 500 MG tablet Take 500 mg by mouth daily.     No current facility-administered medications for this encounter.    Allergies:   Benadryl [diphenhydramine], Codeine, and Penicillins   Social History:  The patient  reports that he quit smoking about 32 years ago. His smoking use included cigarettes. He has never used smokeless tobacco. He reports that he does not currently use alcohol. He reports that he does not currently use drugs.   Family  History:  The patient's family history includes CAD in his paternal uncle; Heart attack in his paternal grandfather; Heart disease in his father.   ROS:  Please see the history of present illness.   All other systems are personally reviewed and negative.   Vitals:   12/26/21 1120  BP: 122/70  Pulse: (!) 54  SpO2: 97%  Weight: 74.2 kg (163 lb 9.6 oz)    Exam:   General:  Well appearing. No resp difficulty HEENT: normal Neck: supple. no JVD. Carotids 2+ bilat; no bruits. No lymphadenopathy or thryomegaly appreciated. Cor: PMI nondisplaced. Regular. Brady No rubs, gallops or murmurs. Lungs: clear Abdomen: soft, nontender, nondistended. No hepatosplenomegaly. No bruits or masses. Good bowel sounds. Extremities: no cyanosis, clubbing, rash, edema Neuro: alert & orientedx3, cranial nerves grossly intact. moves all 4 extremities w/o difficulty. Affect pleasant  ECG: SB 55 1AVB 226 RBBB Personally reviewed    10/16/2021: TSH 2.668 10/17/2021: Hemoglobin 11.5; Magnesium 1.8; Platelets 129 11/21/2021: ALT 37; B Natriuretic Peptide 28.3; BUN 15; Creatinine, Ser 1.02; Potassium 4.6; Sodium 137    Wt Readings from Last 3 Encounters:  12/26/21 74.2 kg (163 lb 9.6 oz)  12/13/21 75.8 kg (167 lb)  11/21/21 73.1 kg (161 lb 3.2 oz)      ASSESSMENT AND PLAN:  1. Chronic Systolic Heart Failure (NICM) -Echo @ [place] 2/21 EF of 25 to 30% with severe global hypokinesis.  RV also noted to be moderately enlarged with moderately impaired RV systolic function. -LHC 05/26/19 w/ mild, nonobstructive CAD. RHC w/ compensated hemodynamics  - cMRI 2/21 EF 18% w/ nonspecific inferoseptal RV insertion site LGE. ECV percentage elevated at 39%, can see w/ increased fibrosis with cardiomyopathy or myocarditis. Suspect PVC also contributing. PVCs now suppressed with amio.  - Echo 08/29/19 EF 40-45% (improved with PVC suppression) - Echo 04/24/20 EF 35-40% RV mildly HK. - cMRI 3/22 LVEF 40% RVEF 43% mild LGE ECV  38%  - Echo 12/28/20 EF 50-55%  - Echo 07/23: EF 45-50%, RV okay - Echo today 12/26/21 EF 50-55% mild HK of mid to distal lateral.inferolateral walls G1DD Personally reviewed - Stable NYHA I-II Volume status ok Continue PRN torsemide.  - Continue carvedilol 6.25 mg bid - Continue Entresto  49/51  - Continue spironolactone 25 mg daily.  - Continue Farxiga 10 - Based on improvement in cMRI, I suspect main issue was resolving myocarditis vs PVCs cardiomyopathy. Doubt sarcoid as EF unlikely to get  better without steroid treatment - Management of PVCs as below - Labs today  2. PVCs - Zio 2/21 burden ~8-10% - PVCs suppressed with amio but EF has not normalized - Sleep study negative - Zio 2/22 Rare PVCs (<1%) - Amio stopped 9/22 -Zio 04/23: 16.5% PVC burden - Unable to tolerate mexiletine. Now back on amiodarone. TSH normal 07/23.  - 14 day zio 07/23: < 1% PVC burden - Will start wean amio. Drop to 200 daily today and then stop in 2 weeks.  -  Saw Dr. Rayann Roy Duke 04/23. Ablation to be considered if failed AAD. Patient preferred medical therapy. Missed appt to establish with Dr. Curt Bears in July. Has f/u in October. - Will see back in 4 months and consider repeat monitor when off amio. If PVCs recur can consider ablation.    3. Hypertension - BP well controlled. GDMT as above  4. Hyperlipidemia - Continue statin   5. Type 2 Diabetes Mellitus - Continue Metformin & Farxiga  - Followed by PCP.  6. COPD - PFTs 9/21 Mild obstruction FEV1 2.55L (77%)  7. Tramadol addiction - managed by his wife  Signed, Glori Bickers, MD  12/26/2021 12:10 PM  [facility] [address] [place] [address] [phone] (office) [phone] (fax)

## 2021-12-26 NOTE — Patient Instructions (Signed)
DECREASE Amiodarone to 200 mg Daily  ON OCTOBER  1 ST Stop Amiodarone completely  Follow up appointment in 4 months(January 2024) Call to schedule an appointment  November 2023  If you have any questions or concerns before your next appointment please send [place] a message through [place] or call our office at [phone].    TO LEAVE A MESSAGE FOR THE NURSE SELECT OPTION 2, PLEASE LEAVE A MESSAGE INCLUDING: YOUR NAME DATE OF BIRTH CALL BACK NUMBER REASON FOR CALL**this is important as we prioritize the call backs  YOU WILL RECEIVE A CALL BACK THE SAME DAY AS LONG AS YOU CALL BEFORE 4:00 PM  At the [facility], you and your health needs are our priority. As part of our continuing mission to provide you with exceptional heart care, we have created designated Provider Care Teams. These Care Teams include your primary Cardiologist (physician) and Advanced Practice Providers (APPs- Physician Assistants and Nurse Practitioners) who all work together to provide you with the care you need, when you need it.   You may see any of the following providers on your designated Care Team at your next follow up: Dr Glori Bickers Dr [name] Dr. Roxana Hires, NP Lyda Jester, [place] [facility], [place] Forestine Na, NP Audry Riles, PharmD   Please be sure to bring in all your medications bottles to every appointment.

## 2022-01-01 ENCOUNTER — Encounter: Payer: Self-pay | Admitting: Gastroenterology

## 2022-01-01 ENCOUNTER — Ambulatory Visit (AMBULATORY_SURGERY_CENTER): Payer: Medicare HMO | Admitting: Gastroenterology

## 2022-01-01 VITALS — BP 100/66 | HR 58 | Temp 96.4°F | Resp 11 | Ht 70.0 in | Wt 167.0 lb

## 2022-01-01 DIAGNOSIS — Z09 Encounter for follow-up examination after completed treatment for conditions other than malignant neoplasm: Secondary | ICD-10-CM | POA: Diagnosis present

## 2022-01-01 DIAGNOSIS — Z86010 Personal history of colonic polyps: Secondary | ICD-10-CM | POA: Diagnosis not present

## 2022-01-01 DIAGNOSIS — K635 Polyp of colon: Secondary | ICD-10-CM

## 2022-01-01 DIAGNOSIS — D124 Benign neoplasm of descending colon: Secondary | ICD-10-CM

## 2022-01-01 MED ORDER — SODIUM CHLORIDE 0.9 % IV SOLN: 500.0000 mL | Freq: Once | INTRAVENOUS | Status: DC

## 2022-01-01 NOTE — Patient Instructions (Signed)
Discharge instructions given. Handouts on polyps,diverticulosis and hemorrhoids. Resume previous medications. YOU HAD AN ENDOSCOPIC PROCEDURE TODAY AT THE [facility]:   Refer to the procedure report that was given to you for any specific questions about what was found during the examination.  If the procedure report does not answer your questions, please call your gastroenterologist to clarify.  If you requested that your care partner not be given the details of your procedure findings, then the procedure report has been included in a sealed envelope for you to review at your convenience later.  YOU SHOULD EXPECT: Some feelings of bloating in the abdomen. Passage of more gas than usual.  Walking can help get rid of the air that was put into your GI tract during the procedure and reduce the bloating. If you had a lower endoscopy (such as a colonoscopy or flexible sigmoidoscopy) you may notice spotting of blood in your stool or on the toilet paper. If you underwent a bowel prep for your procedure, you may not have a normal bowel movement for a few days.  Please Note:  You might notice some irritation and congestion in your nose or some drainage.  This is from the oxygen used during your procedure.  There is no need for concern and it should clear up in a day or so.  SYMPTOMS TO REPORT IMMEDIATELY:  Following lower endoscopy (colonoscopy or flexible sigmoidoscopy):  Excessive amounts of blood in the stool  Significant tenderness or worsening of abdominal pains  Swelling of the abdomen that is new, acute  Fever of 100F or higher   For urgent or emergent issues, a gastroenterologist can be reached at any hour by calling [phone]. Do not use MyChart messaging for urgent concerns.    DIET:  We do recommend a small meal at first, but then you may proceed to your regular diet.  Drink plenty of fluids but you should avoid alcoholic beverages for 24 hours.  ACTIVITY:  You should  plan to take it easy for the rest of today and you should NOT DRIVE or use heavy machinery until tomorrow (because of the sedation medicines used during the test).    FOLLOW UP: Our staff will call the number listed on your records the next business day following your procedure.  We will call around 7:15- 8:00 am to check on you and address any questions or concerns that you may have regarding the information given to you following your procedure. If we do not reach you, we will leave a message.     If any biopsies were taken you will be contacted by phone or by letter within the next 1-3 weeks.  Please call us at [phone] if you have not heard about the biopsies in 3 weeks.    SIGNATURES/CONFIDENTIALITY: You and/or your care partner have signed paperwork which will be entered into your electronic medical record.  These signatures attest to the fact that that the information above on your After Visit Summary has been reviewed and is understood.  Full responsibility of the confidentiality of this discharge information lies with you and/or your care-partner. 

## 2022-01-01 NOTE — Progress Notes (Signed)
0938 Ephedrine 10 mg given IV due to low BP, MD updated.  

## 2022-01-01 NOTE — Op Note (Signed)
[place] Patient Name: Roy Duke Procedure Date: 01/01/2022 9:20 AM MRN: [id number] Endoscopist: Jackquline Denmark , MD Age: 71 Referring MD:  Date of Birth: 07-Mar-1951 Gender: Male Account #: [id number] Procedure:                Colonoscopy Indications:              High risk colon cancer surveillance: Personal                            history of colonic polyps Medicines:                Monitored Anesthesia Care Procedure:                Pre-Anesthesia Assessment:                           - Prior to the procedure, a History and Physical                            was performed, and patient medications and                            allergies were reviewed. The patient's tolerance of                            previous anesthesia was also reviewed. The risks                            and benefits of the procedure and the sedation                            options and risks were discussed with the patient.                            All questions were answered, and informed consent                            was obtained. Prior Anticoagulants: The patient has                            taken no previous anticoagulant or antiplatelet                            agents. ASA Grade Assessment: II - A patient with                            mild systemic disease. After reviewing the risks                            and benefits, the patient was deemed in                            satisfactory condition to undergo the procedure.  After obtaining informed consent, the colonoscope                            was passed under direct vision. Throughout the                            procedure, the patient's blood pressure, pulse, and                            oxygen saturations were monitored continuously. The                            Olympus PCF-H190DL (#[id number]) Colonoscope was                            introduced through the anus and advanced to  the the                            cecum, identified by appendiceal orifice and                            ileocecal valve. The colonoscopy was performed                            without difficulty. The patient tolerated the                            procedure well. The quality of the bowel                            preparation was good except in some areas of the                            right colon where there was adherent stool which                            could not be fully washed. The colon was highly                            redundant. The passage of scope was assisted by                            abdominal pressure. The ileocecal valve,                            appendiceal orifice, and rectum were photographed. Scope In: 9:26:37 AM Scope Out: 9:42:12 AM Scope Withdrawal Time: 0 hours 10 minutes 14 seconds  Total Procedure Duration: 0 hours 15 minutes 35 seconds  Findings:                 A 4 mm polyp was found in the mid descending colon.                            The  polyp was sessile. The polyp was removed with a                            cold snare. Resection and retrieval were complete.                           Many small-mouthed diverticula were found in the                            sigmoid colon, transverse colon and ascending colon.                           Non-bleeding external and internal hemorrhoids were                            found during retroflexion and during perianal exam.                            The hemorrhoids were small.                           The exam was otherwise without abnormality on                            direct and retroflexion views. The colon was highly                            redundant. Complications:            No immediate complications. Estimated Blood Loss:     Estimated blood loss: none. Impression:               - One 4 mm polyp in the mid descending colon,                            removed with a cold  snare. Resected and retrieved.                           - Mild pancolonic diverticulosis.                           - Non-bleeding external and internal hemorrhoids.                           - The examination was otherwise normal on direct                            and retroflexion views. Recommendation:           - Patient has a contact number available for                            emergencies. The signs and symptoms of potential                            delayed complications were discussed  with the                            patient. Return to normal activities tomorrow.                            Written discharge instructions were provided to the                            patient.                           - High fiber diet.                           - Continue present medications.                           - Await pathology results.                           - Repeat colonoscopy for surveillance based on                            pathology results.                           - The findings and recommendations were discussed                            with the patient's family. Jackquline Denmark, MD 01/01/2022 9:47:25 AM This report has been signed electronically.

## 2022-01-01 NOTE — Progress Notes (Signed)
Called to room to assist during endoscopic procedure.  Patient ID and intended procedure confirmed with present staff. Received instructions for my participation in the procedure from the performing physician.  

## 2022-01-01 NOTE — Progress Notes (Signed)
[facility] History and Physical   Primary Care Physician:  [name], MD   Reason for Procedure:    history of polyps  Plan:     colonoscopy     HPI: Roy Duke is a 71 y.o. male    Past Medical History:  Diagnosis Date   Anxiety    Asthma    CHF (congestive heart failure) ([place])    Diabetes mellitus without complication (HCC)    GERD (gastroesophageal reflux disease)    Hyperlipidemia    Hypertension    NSVT (nonsustained ventricular tachycardia) (HCC)    PVC's (premature ventricular contractions)    Substance abuse ([place])     Past Surgical History:  Procedure Laterality Date   INGUINAL HERNIA REPAIR Bilateral    RIGHT/LEFT HEART CATH AND CORONARY ANGIOGRAPHY N/A 05/26/2019   Procedure: RIGHT/LEFT HEART CATH AND CORONARY ANGIOGRAPHY;  Surgeon: [name], MD;  Location: [place];  Service: Cardiovascular;  Laterality: N/A;    Prior to Admission medications   Medication Sig Start Date End Date Taking? Authorizing Provider  amiodarone (PACERONE) 200 MG tablet Take 1 tablet (200 mg total) by mouth daily. 12/26/21  Yes Bensimhon, Shaune Pascal, MD  amLODipine (NORVASC) 5 MG tablet Take 1 tablet by mouth once daily 09/12/21  Yes Bensimhon, Shaune Pascal, MD  aspirin EC 81 MG EC tablet Take 1 tablet (81 mg total) by mouth daily. 05/30/19  Yes Cheryln Manly, NP  atorvastatin (LIPITOR) 40 MG tablet Take 40 mg by mouth daily at 6 PM.  02/16/19  Yes [provider]  carvedilol (COREG) 6.25 MG tablet Take 1 tablet by mouth twice daily 07/24/21  Yes Bensimhon, Shaune Pascal, MD  cholecalciferol (VITAMIN D3) 25 MCG (1000 UNIT) tablet Take 1,000 Units by mouth daily.   Yes [provider]  dapagliflozin propanediol (FARXIGA) 10 MG TABS tablet Take 1 tablet (10 mg total) by mouth daily before breakfast. 11/27/21  Yes Bensimhon, Shaune Pascal, MD  escitalopram (LEXAPRO) 10 MG tablet Take 7.5 mg by mouth daily.   Yes [provider]   metFORMIN (GLUCOPHAGE) 500 MG tablet Take 500 mg by mouth 2 (two) times daily with a meal.    Yes [provider]  omeprazole (PRILOSEC) 40 MG capsule Take 40 mg by mouth daily. 01/13/19  Yes [provider]  sacubitril-valsartan (ENTRESTO) 49-51 MG Take 1 tablet by mouth 2 (two) times daily. 11/21/21  Yes Joette Catching, PA-C  spironolactone (ALDACTONE) 25 MG tablet TAKE 1 TABLET BY MOUTH AT BEDTIME 01/30/21  Yes Bensimhon, Shaune Pascal, MD  traMADol (ULTRAM-ER) 100 MG 24 hr tablet Take 100 mg by mouth daily as needed for pain.   Yes [provider]  vitamin C (ASCORBIC ACID) 500 MG tablet Take 500 mg by mouth daily.   Yes [provider]  Dupilumab (DUPIXENT [place]) Inject 1 Dose into the skin every 14 (fourteen) days.    [provider]  torsemide (DEMADEX) 20 MG tablet Take 1 tablet (20 mg total) by mouth daily as needed (for weight gain). 07/21/19   Bensimhon, Shaune Pascal, MD    Current Outpatient Medications  Medication Sig Dispense Refill   amiodarone (PACERONE) 200 MG tablet Take 1 tablet (200 mg total) by mouth daily. 60 tablet 0   amLODipine (NORVASC) 5 MG tablet Take 1 tablet by mouth once daily 90 tablet 3   aspirin EC 81 MG EC tablet Take 1 tablet (81 mg total) by mouth daily. [place]  tablet 0   atorvastatin (LIPITOR) 40 MG tablet Take 40 mg by mouth daily at 6 PM.      carvedilol (COREG) 6.25 MG tablet Take 1 tablet by mouth twice daily 180 tablet 3   cholecalciferol (VITAMIN D3) 25 MCG (1000 UNIT) tablet Take 1,000 Units by mouth daily.     dapagliflozin propanediol (FARXIGA) 10 MG TABS tablet Take 1 tablet (10 mg total) by mouth daily before breakfast. 90 tablet 3   escitalopram (LEXAPRO) 10 MG tablet Take 7.5 mg by mouth daily.     metFORMIN (GLUCOPHAGE) 500 MG tablet Take 500 mg by mouth 2 (two) times daily with a meal.      omeprazole (PRILOSEC) 40 MG capsule Take 40 mg by mouth daily.     sacubitril-valsartan (ENTRESTO) 49-51 MG Take 1  tablet by mouth 2 (two) times daily. 60 tablet 11   spironolactone (ALDACTONE) 25 MG tablet TAKE 1 TABLET BY MOUTH AT BEDTIME 90 tablet 3   traMADol (ULTRAM-ER) 100 MG 24 hr tablet Take 100 mg by mouth daily as needed for pain.     vitamin C (ASCORBIC ACID) 500 MG tablet Take 500 mg by mouth daily.     Dupilumab (DUPIXENT [place]) Inject 1 Dose into the skin every 14 (fourteen) days.     torsemide (DEMADEX) 20 MG tablet Take 1 tablet (20 mg total) by mouth daily as needed (for weight gain). 30 tablet 1   Current Facility-Administered Medications  Medication Dose Route Frequency Provider Last Rate Last Admin   0.9 %  sodium chloride infusion  500 mL Intravenous Once Jackquline Denmark, MD        Allergies as of 01/01/2022 - Review Complete 01/01/2022  Allergen Reaction Noted   Benadryl [diphenhydramine] Other (See Comments) 06/25/2020   Codeine Nausea Only 05/29/2016   Penicillins Other (See Comments) 05/29/2016    Family History  Problem Relation Age of Onset   Heart disease Father        cardiac arrest in his 37's - surviced this and had PPM placed.    CAD Paternal Uncle        CABG   Heart attack Paternal Grandfather    Colon cancer Neg Hx    Esophageal cancer Neg Hx    Rectal cancer Neg Hx    Stomach cancer Neg Hx     Social History   Socioeconomic History   Marital status: Married    Spouse name: Not on file   Number of children: Not on file   Years of education: Not on file   Highest education level: Not on file  Occupational History   Not on file  Tobacco Use   Smoking status: Former    Types: Cigarettes    Quit date: 04/07/1989    Years since quitting: 32.7   Smokeless tobacco: Never  Vaping Use   Vaping Use: Never used  Substance and Sexual Activity   Alcohol use: Not Currently   Drug use: Not Currently   Sexual activity: Yes  Other Topics Concern   Not on file  Social History Narrative   Not on file   Social Determinants of Health   Financial Resource Strain:  Not on file  Food Insecurity: Not on file  Transportation Needs: Not on file  Physical Activity: Not on file  Stress: Not on file  Social Connections: Not on file  Intimate Partner Violence: Not on file    Review of Systems: Positive for none All other review of systems  negative except as mentioned in the HPI.  Physical Exam: Vital signs in last 24 hours: '@VSRANGES'$ @   General:   Alert,  Well-developed, well-nourished, pleasant and cooperative in NAD Lungs:  Clear throughout to auscultation.   Heart:  Regular rate and rhythm; no murmurs, clicks, rubs,  or gallops. Abdomen:  Soft, nontender and nondistended. Normal bowel sounds.   Neuro/Psych:  Alert and cooperative. Normal mood and affect. A and O x 3    No significant changes were identified.  The patient continues to be an appropriate candidate for the planned procedure and anesthesia.   Carmell Austria, MD. [facility] 01/01/2022 9:47 AM@

## 2022-01-01 NOTE — Progress Notes (Signed)
Report given to PACU, vss 

## 2022-01-01 NOTE — Progress Notes (Signed)
Pt's states no medical or surgical changes since previsit or office visit. 

## 2022-01-02 ENCOUNTER — Telehealth: Payer: Self-pay

## 2022-01-02 NOTE — Telephone Encounter (Signed)
  Follow up Call-     01/01/2022    8:42 AM  Call back number  Post procedure Call Back phone  # [phone]  Permission to leave phone message Yes     Patient questions:  Do you have a fever, pain , or abdominal swelling? No. Pain Score  0 *  Have you tolerated food without any problems? Yes.    Have you been able to return to your normal activities? Yes.    Do you have any questions about your discharge instructions: Diet   No. Medications  No. Follow up visit  No.  Do you have questions or concerns about your Care? No.  Actions: * If pain score is 4 or above: No action needed, pain <4.

## 2022-01-03 ENCOUNTER — Encounter: Payer: Self-pay | Admitting: Gastroenterology

## 2022-01-06 ENCOUNTER — Ambulatory Visit: Payer: Medicare HMO | Attending: Cardiology | Admitting: Cardiology

## 2022-01-06 ENCOUNTER — Encounter: Payer: Self-pay | Admitting: Cardiology

## 2022-01-06 VITALS — BP 122/72 | HR 63 | Ht 70.0 in | Wt 167.0 lb

## 2022-01-06 DIAGNOSIS — I10 Essential (primary) hypertension: Secondary | ICD-10-CM | POA: Diagnosis not present

## 2022-01-06 DIAGNOSIS — I5022 Chronic systolic (congestive) heart failure: Secondary | ICD-10-CM | POA: Diagnosis not present

## 2022-01-06 DIAGNOSIS — I493 Ventricular premature depolarization: Secondary | ICD-10-CM | POA: Diagnosis not present

## 2022-01-06 NOTE — Progress Notes (Signed)
Electrophysiology Office Note   Date:  01/06/2022   ID:  Roy Duke, Roy Duke [birth year], MRN [id number]  PCP:  [name], MD  Cardiologist:  Beinsimhon Primary Electrophysiologist:  Rinaldo Macqueen Meredith Leeds, MD    Chief Complaint: PVC   History of Present Illness: Roy Duke is a 71 y.o. male who is being seen today for the evaluation of PVC at the request of Joette Catching, *. Presenting today for electrophysiology evaluation.  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  He was admitted to [facility] December 2020 with severe hyponatremia and heart failure.  Ejection fraction was found to be 25%.  He developed nonsustained VT with polymorphic ectopy.  EKG showed right bundle branch block with T wave inversions.  He was transferred to [facility].  Left heart catheterization showed nonobstructive coronary artery disease.  Cardiac MRI showed ejection fraction 28% with nonspecific inferoseptal RV insertion site LGE.  He continued to have PVCs and was started on amiodarone.  PVC burden was 8 to 10%.  He had suppression of PVCs.  He has since had an improvement in his ejection fraction.  Amiodarone was stopped September 2022.  His PVC burden was found to be 16.5%.  Again amiodarone was restarted and PVCs went away.  Currently he is doing well on amiodarone.  He is having no PVCs.  Amiodarone was stopped on Sunday to see if PVCs were resumed.  Today, he denies symptoms of palpitations, chest pain, shortness of breath, orthopnea, PND, lower extremity edema, claudication, dizziness, presyncope, syncope, bleeding, or neurologic sequela. The patient is tolerating medications without difficulties.    Past Medical History:  Diagnosis Date   Anxiety    Asthma    CHF (congestive heart failure) ([place])    Diabetes mellitus without complication  (HCC)    GERD (gastroesophageal reflux disease)    Hyperlipidemia    Hypertension    NSVT (nonsustained ventricular tachycardia) (HCC)    PVC's (premature ventricular contractions)    Substance abuse ([place])    Past Surgical History:  Procedure Laterality Date   INGUINAL HERNIA REPAIR Bilateral    RIGHT/LEFT HEART CATH AND CORONARY ANGIOGRAPHY N/A 05/26/2019   Procedure: RIGHT/LEFT HEART CATH AND CORONARY ANGIOGRAPHY;  Surgeon: [name], MD;  Location: [place] [facility];  Service: Cardiovascular;  Laterality: N/A;     Current Outpatient Medications  Medication Sig Dispense Refill   amLODipine (NORVASC) 5 MG tablet Take 1 tablet by mouth once daily 90 tablet 3   aspirin EC 81 MG EC tablet Take 1 tablet (81 mg total) by mouth daily. 90 tablet 0   atorvastatin (LIPITOR) 40 MG tablet Take 40 mg by mouth daily at 6 PM.      carvedilol (COREG) 6.25 MG tablet Take 1 tablet by mouth twice daily 180 tablet 3   cholecalciferol (VITAMIN D3) 25 MCG (1000 UNIT) tablet Take 1,000 Units by mouth daily.     dapagliflozin propanediol (FARXIGA) 10 MG TABS tablet Take 1 tablet (10 mg total) by mouth daily before breakfast. 90 tablet 3   Dupilumab (DUPIXENT [place]) Inject 1 Dose into the skin every 14 (fourteen) days.     escitalopram (LEXAPRO) 10 MG tablet Take 10 mg by mouth daily.     metFORMIN (GLUCOPHAGE) 500 MG tablet Take 500 mg by mouth 2 (two) times daily with a meal.      omeprazole (  PRILOSEC) 40 MG capsule Take 40 mg by mouth daily.     sacubitril-valsartan (ENTRESTO) 49-51 MG Take 1 tablet by mouth 2 (two) times daily. 60 tablet 11   spironolactone (ALDACTONE) 25 MG tablet TAKE 1 TABLET BY MOUTH AT BEDTIME 90 tablet 3   torsemide (DEMADEX) 20 MG tablet Take 1 tablet (20 mg total) by mouth daily as needed (for weight gain). 30 tablet 1   traMADol (ULTRAM-ER) 100 MG 24 hr tablet Take 100 mg by mouth daily as needed for pain.     vitamin C (ASCORBIC ACID) 500 MG tablet Take 500 mg by mouth  daily.     No current facility-administered medications for this visit.    Allergies:   Benadryl [diphenhydramine], Codeine, and Penicillins   Social History:  The patient  reports that he quit smoking about 32 years ago. His smoking use included cigarettes. He has never used smokeless tobacco. He reports that he does not currently use alcohol. He reports that he does not currently use drugs.   Family History:  The patient's family history includes CAD in his paternal uncle; Heart attack in his paternal grandfather; Heart disease in his father.    ROS:  Please see the history of present illness.   Otherwise, review of systems is positive for none.   All other systems are reviewed and negative.    PHYSICAL EXAM: VS:  BP 122/72   Pulse 63   Ht '5\' 10"'$  (1.778 m)   Wt 167 lb (75.8 kg)   SpO2 95%   BMI 23.96 kg/m  , BMI Body mass index is 23.96 kg/m. GEN: Well nourished, well developed, in no acute distress  HEENT: normal  Neck: no JVD, carotid bruits, or masses Cardiac: RRR; no murmurs, rubs, or gallops,no edema  Respiratory:  clear to auscultation bilaterally, normal work of breathing GI: soft, nontender, nondistended, + BS MS: no deformity or atrophy  Skin: warm and dry Neuro:  Strength and sensation are intact Psych: euthymic mood, full affect  EKG:  EKG is ordered today. Personal review of the ekg ordered shows sinus rhythm, right bundle branch block, first-degree AV block, rate 63   Recent Labs: 10/16/2021: TSH 2.668 10/17/2021: Hemoglobin 11.5; Magnesium 1.8; Platelets 129 11/21/2021: ALT 37; B Natriuretic Peptide 28.3; BUN 15; Creatinine, Ser 1.02; Potassium 4.6; Sodium 137    Lipid Panel     Component Value Date/Time   CHOL 176 12/28/2020 1132   TRIG 96 12/28/2020 1132   HDL 83 12/28/2020 1132   CHOLHDL 2.1 12/28/2020 1132   VLDL 19 12/28/2020 1132   LDLCALC 74 12/28/2020 1132     Wt Readings from Last 3 Encounters:  01/06/22 167 lb (75.8 kg)  01/01/22 167  lb (75.8 kg)  12/26/21 163 lb 9.6 oz (74.2 kg)      Other studies Reviewed: Additional studies/ records that were reviewed today include: TTE 12/26/21  Review of the above records today demonstrates:   1. Left ventricular ejection fraction, by estimation, is 50 to 55%. The  left ventricle has low normal function. The left ventricle demonstrates  regional wall motion abnormalities (see scoring diagram/findings for  description). Left ventricular diastolic   parameters are consistent with Grade I diastolic dysfunction (impaired  relaxation). There is hypokinesis of the left ventricular, mid-apical  inferior wall and inferolateral wall.   2. Right ventricular systolic function is normal. The right ventricular  size is normal.   3. Left atrial size was moderately dilated.   4. Right  atrial size was moderately dilated.   5. The mitral valve is normal in structure. No evidence of mitral valve  regurgitation. No evidence of mitral stenosis.   6. The aortic valve is tricuspid. There is mild calcification of the  aortic valve. Aortic valve regurgitation is not visualized. Aortic valve  sclerosis/calcification is present, without any evidence of aortic  stenosis.   7. The inferior vena cava is normal in size with greater than 50%  respiratory variability, suggesting right atrial pressure of 3 mmHg.  Cardiac monitor 07/17/2021 personally reviewed 1. Sinus rhythm with bundle branch block - avg HR of 84 bpm.  2.  768 runs of nonsustained Ventricular Tachycardia occurred, the run with the fastest interval lasting 6 beats with a max rate of 210 bpm, the longest lasting 8 beats with an avg rate of 106 bpm.  3. Two runs of Supraventricular Tachycardia runs occurred, the run with the fastest interval lasting 5 beats with a max  rate of 190 bpm, the longest lasting 5 beats with an avg rate of 139 bpm. 4. Frequent PVCs (16.5%, [id number]), Ventricular Couplets were occasional (4.0%,  33733), and Ventricular  Triplets were occasional (1.0%, 5782).  5. Ventricular Bigeminy and Trigeminy were present. 6. Two primary PVC morphology with 10.0% and 6.4% burden, respectively  ASSESSMENT AND PLAN:  1.  PVCs: Elevated burden at 16.5%.  Was previously on amiodarone but this has recently been stopped.  Did not tolerate mexiletine.  No PVCs today with a low burden on most recent monitor.  As amiodarone has been stopped, if PVCs return, he would be a candidate for alternative antiarrhythmics or ablation.  He Rolly Magri see his primary cardiologist in January and if PVCs are occurring at that time, we Kevan Prouty get him into clinic to discuss further steps in therapy.  2.  Chronic systolic heart failure: Due to nonischemic cardiomyopathy.  Currently on optimal medical therapy per heart failure cardiology.  Ejection fraction has normalized.  3.  Hypertension: Currently well controlled  Case discussed with primary cardiology    Current medicines are reviewed at length with the patient today.   The patient does not have concerns regarding his medicines.  The following changes were made today:  none  Labs/ tests ordered today include:  Orders Placed This Encounter  Procedures   EKG 12-Lead     Disposition:   FU with [facility] Seibold pending evaluation with heart failure cardiology  Signed, Kishan Wachsmuth Meredith Leeds, MD  01/06/2022 7:34 PM     [facility] [address] [place] [address] [phone] (office) [phone] (fax)

## 2022-01-16 ENCOUNTER — Telehealth (HOSPITAL_COMMUNITY): Payer: Self-pay | Admitting: Pharmacy Technician

## 2022-01-16 NOTE — Telephone Encounter (Signed)
Advanced Heart Failure Patient Advocate Encounter   Patient was automatically approved to receive Farxiga from [employer] through 04/07/23  Document scanned to chart. Patient will be notified via mail. Patient currently using the assistance through previous enrollment end date. No further action needed at this time.  Charlann Boxer, CPhT

## 2022-02-03 ENCOUNTER — Other Ambulatory Visit (HOSPITAL_COMMUNITY): Payer: Self-pay | Admitting: Internal Medicine

## 2022-03-20 ENCOUNTER — Other Ambulatory Visit: Payer: Self-pay | Admitting: Cardiology

## 2022-03-20 NOTE — Telephone Encounter (Signed)
This is a CHF pt 

## 2022-04-13 ENCOUNTER — Other Ambulatory Visit: Payer: Self-pay | Admitting: Internal Medicine

## 2022-04-30 ENCOUNTER — Ambulatory Visit (HOSPITAL_COMMUNITY)
Admission: RE | Admit: 2022-04-30 | Discharge: 2022-04-30 | Disposition: A | Discharge status: Home / Self Care | Payer: Medicare HMO | Source: Ambulatory Visit | Attending: Internal Medicine | Admitting: Internal Medicine

## 2022-04-30 ENCOUNTER — Encounter (HOSPITAL_COMMUNITY): Payer: Self-pay | Admitting: Internal Medicine

## 2022-04-30 VITALS — BP 110/70 | HR 66 | Wt 180.6 lb

## 2022-04-30 DIAGNOSIS — I5022 Chronic systolic (congestive) heart failure: Secondary | ICD-10-CM | POA: Diagnosis present

## 2022-04-30 DIAGNOSIS — I10 Essential (primary) hypertension: Secondary | ICD-10-CM | POA: Diagnosis not present

## 2022-04-30 DIAGNOSIS — I4510 Unspecified right bundle-branch block: Secondary | ICD-10-CM | POA: Diagnosis not present

## 2022-04-30 DIAGNOSIS — I493 Ventricular premature depolarization: Secondary | ICD-10-CM | POA: Diagnosis not present

## 2022-04-30 NOTE — Progress Notes (Addendum)
Advanced HF Clinic Note   Date:  04/30/2022   ID:  Roy Duke, DOB [birth year], MRN [id number]  Location: Home  Provider location: [facility] Type of Visit: Established patient  PCP:  Roy Nasuti, MD  Cardiologist:  None Primary HF: Roy Duke  Chief Complaint: hospital follow-up   History of Present Illness:  Roy Duke is a 72 y/o male with HTN, HL, DM2, former tobacco, ETOH use (quit 30 years ago) and systolic HF due to NICM.  Admitted 12/20 to [facility] with severe hyponatremia and HF.  Echo EF 25%. Developed NSVT with polymorphic ectopy. ECG with RBBB and new lateral TWI. He was transferred to [facility].   LHC w/ mild, nonobstructive CAD. Well compensated hemodynamics on RHC. cMRi EF 28% w/ nonspecific inferoseptal RV insertion site LGE. ECV percentage elevated at 39%. 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  He was started on amiodarone for suppression of PVCs and fitted w/ a LifeVest prior to discharge.    Sleep study 3/21 AHI 2.3   Echo 5/21: EF 40-45%  Echo 1/22 EF 35-40% RV mildly HK. Personally reviewed   cMRI 3/22 - repeated due to persistent LV dysfunction despite PVC suppression 1. LVEF 40% ECV = 38%.Midwall LGE in the basal anterior and anteroseptal walls, 2. RVEF 43%  3. When compared to the prior study from 05/29/2019, there is decrease in size in both ventricles as well as improvement in biventricular systolic function. ECV is 38%. There are 2 segments of midwall nonspecific LGE that might be associated with idiopathic NICM. Consider sarcoid   Echo (12/28/20): EF 50-55% Personally reviewed  Amio stopped in 09/22.  Cardiac monitor 04/23: 16.5% PVC burden. He was unable to tolerate mexiletine and was started on amiodarone. Saw Dr. Rayann Duke and PVC ablation to be considered if PVC burden did not improve with amiodarone. Amio stopped  Echo 7/23 EF 45-50%, RV okay. Volume okay.14 day zio 07/23: SR with  < 1% PVC burden  Echo 12/26/21 EF 50-55% mild HK of mid to distal lateral/inferolateral walls G1DD   Saw EP (Roy Duke) in 10/23 . No PVCs on ECG. Watchful waiting. If PVCs recur -> consider ablation   He is here today for f/u. Accompanied by his wife. 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 Feels ok. Worried about weight gain. No edema, orthopnea or PND. Mild DOE. Compliant with meds.    Past Medical History:  Diagnosis Date   Anxiety    Asthma    CHF (congestive heart failure) ([place])    Diabetes mellitus without complication (HCC)    GERD (gastroesophageal reflux disease)    Hyperlipidemia    Hypertension    NSVT (nonsustained ventricular tachycardia) (HCC)    PVC's (premature ventricular contractions)    Substance abuse ([place])    Past Surgical History:  Procedure Laterality Date   INGUINAL HERNIA REPAIR Bilateral    RIGHT/LEFT HEART CATH AND CORONARY ANGIOGRAPHY N/A 05/26/2019   Procedure: RIGHT/LEFT HEART CATH AND CORONARY ANGIOGRAPHY;  Surgeon: Roy Artist, MD;  Location: [facility];  Service: Cardiovascular;  Laterality: N/A;     Current Outpatient Medications  Medication Sig Dispense Refill   amLODipine (NORVASC) 5 MG tablet Take 1 tablet by mouth once daily 90 tablet 3   aspirin EC 81 MG EC tablet Take 1 tablet (81 mg total) by mouth daily. 90 tablet 0   atorvastatin (LIPITOR) 40 MG tablet  Take 40 mg by mouth daily at 6 PM.      carvedilol (COREG) 6.25 MG tablet Take 1 tablet by mouth twice daily 180 tablet 3   cholecalciferol (VITAMIN D3) 25 MCG (1000 UNIT) tablet Take 1,000 Units by mouth daily.     dapagliflozin propanediol (FARXIGA) 10 MG TABS tablet Take 1 tablet (10 mg total) by mouth daily before breakfast. 90 tablet 3   Dupilumab (DUPIXENT [place]) Inject 1 Dose into the skin every 14 (fourteen) days.     escitalopram (LEXAPRO) 10 MG tablet Take 10 mg by mouth daily.     metFORMIN (GLUCOPHAGE) 500 MG tablet Take 500 mg by  mouth 2 (two) times daily with a meal.      omeprazole (PRILOSEC) 40 MG capsule Take 40 mg by mouth daily.     sacubitril-valsartan (ENTRESTO) 49-51 MG Take 1 tablet by mouth 2 (two) times daily. 60 tablet 11   spironolactone (ALDACTONE) 25 MG tablet TAKE 1 TABLET BY MOUTH AT BEDTIME 90 tablet 3   torsemide (DEMADEX) 20 MG tablet Take 20 mg by mouth as needed.     traMADol (ULTRAM-ER) 100 MG 24 hr tablet Take 100 mg by mouth daily as needed for pain.     vitamin C (ASCORBIC ACID) 500 MG tablet Take 500 mg by mouth daily.     No current facility-administered medications for this encounter.    Allergies:   Benadryl [diphenhydramine], Codeine, and Penicillins   Social History:  The patient  reports that he quit smoking about 33 years ago. His smoking use included cigarettes. He has never used smokeless tobacco. He reports that he does not currently use alcohol. He reports that he does not currently use drugs.   Family History:  The patient's family history includes CAD in his paternal uncle; Heart attack in his paternal grandfather; Heart disease in his father.   ROS:  Please see the history of present illness.   All other systems are personally reviewed and negative.   Vitals:   04/30/22 0905  BP: 110/70  Pulse: 66  SpO2: 96%  Weight: 81.9 kg (180 lb 9.6 oz)   Wt Readings from Last 3 Encounters:  04/30/22 81.9 kg (180 lb 9.6 oz)  01/06/22 75.8 kg (167 lb)  01/01/22 75.8 kg (167 lb)     Exam:   General:  Well appearing. No resp difficulty HEENT: normal Neck: supple. no JVD. Carotids 2+ bilat; no bruits. No lymphadenopathy or thryomegaly appreciated. Cor: PMI nondisplaced. Regular rate & rhythm. No rubs, gallops or murmurs. Lungs: clear Abdomen: soft, nontender, nondistended. No hepatosplenomegaly. No bruits or masses. Good bowel sounds. Extremities: no cyanosis, clubbing, rash, edema Neuro: alert & orientedx3, cranial nerves grossly intact. moves all 4 extremities w/o  difficulty. Affect pleasant   ECG: Sinus 65 RBBB No pvcs Personally reviewed    10/16/2021: TSH 2.668 10/17/2021: Hemoglobin 11.5; Magnesium 1.8; Platelets 129 11/21/2021: ALT 37; B Natriuretic Peptide 28.3; BUN 15; Creatinine, Ser 1.02; Potassium 4.6; Sodium 137    Wt Readings from Last 3 Encounters:  04/30/22 81.9 kg (180 lb 9.6 oz)  01/06/22 75.8 kg (167 lb)  01/01/22 75.8 kg (167 lb)      ASSESSMENT AND PLAN:  1. Chronic Systolic Heart Failure (NICM) -Echo @ [facility] 2/21 EF of 25 to 30% with severe global hypokinesis.  RV also noted to be moderately enlarged with moderately impaired RV systolic function. -LHC 05/26/19 w/ mild, nonobstructive CAD. RHC w/ compensated hemodynamics  - cMRI 2/21 EF 18%  w/ nonspecific inferoseptal RV insertion site LGE. ECV percentage elevated at 39%, can see w/ increased fibrosis with cardiomyopathy or myocarditis. Suspect PVC also contributing. PVCs now suppressed with amio.  - Echo 08/29/19 EF 40-45% (improved with PVC suppression) - Echo 04/24/20 EF 35-40% RV mildly HK. - cMRI 3/22 LVEF 40% RVEF 43% mild LGE ECV 38%  - Echo 12/28/20 EF 50-55%  - Echo 07/23: EF 45-50%, RV okay - Echo 12/26/21 EF 50-55% mild HK of mid to distal lateral.inferolateral walls  - Stable NYHA I-II Volume status ok Continue PRN torsemide.  - Continue carvedilol 6.25 mg bid - Continue Entresto  49/51  - Continue spironolactone 25 mg daily.  - Continue Farxiga 10 - BP running on low side. Stop amlodipine - Based on improvement in cMRI, I suspect main issue was resolving myocarditis which prompted PVCs. Now that myocarditis improved PVCs have resolved. Doubt sarcoid as EF unlikely to get better without steroid treatment - Management of PVCs as below - Labs today. Recheck echo in 4 months  2. PVCs - Zio 2/21 burden ~8-10% - PVCs suppressed with amio but EF has not normalized - Sleep study negative - Zio 2/22 Rare PVCs (<1%) - Amio stopped 9/22 - Zio 04/23: 16.5% PVC  burden - Unable to tolerate mexiletine. Amio restarted then stopped in 7/23 TSH normal 07/23.  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 Now resolved. Continue to follow  3. Hypertension - BP well controlled. Can stop amlodipine - Restart if SBP > 140  4. Hyperlipidemia - Continue statin   5. Type 2 Diabetes Mellitus - Continue Metformin & Farxiga  - Followed by PCP.  6. COPD - PFTs 9/21 Mild obstruction FEV1 2.55L (77%)    Signed, Glori Bickers, MD  04/30/2022 9:37 AM  Advanced Heart Failure Clinic [facility] [place] [address] [phone] (office) [phone] (fax)

## 2022-04-30 NOTE — Patient Instructions (Signed)
STOP Amlodipine. RESTART if blood pressure remains higher than 213 systolic.  Your physician has requested that you have an echocardiogram. Echocardiography is a painless test that uses sound waves to create images of your heart. It provides your doctor with information about the size and shape of your heart and how well your heart's chambers and valves are working. This procedure takes approximately one hour. There are no restrictions for this procedure. Please do NOT wear cologne, perfume, aftershave, or lotions (deodorant is allowed). Please arrive 15 minutes prior to your appointment time.  Your physician recommends that you schedule a follow-up appointment in: 4 months with an echocardiogram (May 2024)  ** please call the office in March to arrange your follow up appointment **  If you have any questions or concerns before your next appointment please send [place] a message through [place] or call our office at [phone].    TO LEAVE A MESSAGE FOR THE NURSE SELECT OPTION 2, PLEASE LEAVE A MESSAGE INCLUDING: YOUR NAME DATE OF BIRTH CALL BACK NUMBER REASON FOR CALL**this is important as we prioritize the call backs  YOU WILL RECEIVE A CALL BACK THE SAME DAY AS LONG AS YOU CALL BEFORE 4:00 PM  At the [facility], you and your health needs are our priority. As part of our continuing mission to provide you with exceptional heart care, we have created designated Provider Care Teams. These Care Teams include your primary Cardiologist (physician) and Advanced Practice Providers (APPs- Physician Assistants and Nurse Practitioners) who all work together to provide you with the care you need, when you need it.   You may see any of the following providers on your designated Care Team at your next follow up: Dr Glori Bickers Dr [name] Dr. Roxana Hires, NP Lyda Jester, [place] [facility], [place] Forestine Na, NP Audry Riles,  PharmD   Please be sure to bring in all your medications bottles to every appointment.

## 2022-07-11 ENCOUNTER — Other Ambulatory Visit (HOSPITAL_COMMUNITY): Payer: Self-pay | Admitting: Cardiology

## 2022-07-11 MED ORDER — DAPAGLIFLOZIN PROPANEDIOL 10 MG PO TABS: 10.0000 mg | ORAL_TABLET | Freq: Every day | ORAL | 3 refills | Status: DC

## 2022-08-01 ENCOUNTER — Other Ambulatory Visit (HOSPITAL_COMMUNITY): Payer: Self-pay | Admitting: Internal Medicine

## 2022-09-29 NOTE — Progress Notes (Addendum)
Advanced HF Clinic Note  Date:  10/06/2022   ID:  [name], DOB [birth year], MRN [id number]  Location: Home  Provider location: [facility] Type of Visit: Established patient  PCP:  Galvin Proffer, MD  Cardiologist:  None Primary HF: [place]   History of Present Illness: Roy Duke is a pleasant 72 y.o. male with HTN, HL, DM2, former tobacco, ETOH use (quit 30 years ago) and systolic HF due to NICM.  Admitted 12/20 to [facility] with severe hyponatremia and HF.  Echo EF 25%. Developed NSVT with polymorphic ectopy. ECG with RBBB and new lateral TWI. He was transferred to [facility].   LHC w/ mild, nonobstructive CAD. Well compensated hemodynamics on RHC. cMRi EF 28% w/ nonspecific inferoseptal RV insertion site LGE. ECV percentage elevated at 39%. 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  He was started on amiodarone for suppression of PVCs and fitted w/ a LifeVest prior to discharge.    -Sleep study 3/21 AHI 2.3  -Echo 5/21: EF 40-45% -Echo 1/22 EF 35-40% RV mildly HK. Personally reviewed   cMRI 3/22 - repeated due to persistent LV dysfunction despite PVC suppression 1. LVEF 40% ECV = 38%.Midwall LGE in the basal anterior and anteroseptal walls, 2. RVEF 43%  3. When compared to the prior study from 05/29/2019, there is decrease in size in both ventricles as well as improvement in biventricular systolic function. ECV is 38%. There are 2 segments of midwall nonspecific LGE that might be associated with idiopathic NICM. Consider sarcoid   Echo (12/28/20): EF 50-55% Personally reviewed  Amio stopped 09/22.  Cardiac monitor 04/23: 16.5% PVC burden. He was unable to tolerate mexiletine and was started on amiodarone. Saw Dr. Johney Frame and PVC ablation to be considered if PVC burden did not improve with amiodarone. Amio stopped  Echo 7/23 EF 45-50%, RV okay. Volume okay.14 day zio 07/23: SR with < 1% PVC burden Echo 12/26/21 EF  50-55% mild HK of mid to distal lateral/inferolateral walls G1DD   Saw EP ([name]) 10/23 . No PVCs on ECG. Watchful waiting. If PVCs recur -> consider ablation   Today he returns for AHF follow up with his wife. Overall feeling good. Denies palpitations, CP, dizziness, edema, or PND/Orthopnea. Some SOB around 2 weeks ago, resolved by taking torsemide a couple days that week. Appetite good, eating more fruit lately. Has been feeling more fatigued lately. No fever or chills. Weight at home 178 pounds, has gained about 10 lbs in the last month, suspect from being more sedentary. Takes torsemide usually around once monthly, if that. Taking all medications. SBP at home before meds 140s at home. Denies ETOH, smoking. Previously worked full time up until 2-3 months ago, now retired, much more sedentary now since not working. [facility] [employer]    Past Medical History:  Diagnosis Date   Anxiety    Asthma    CHF (congestive heart failure) (HCC)    Diabetes mellitus without complication (HCC)    GERD (gastroesophageal reflux disease)    Hyperlipidemia    Hypertension    NSVT (nonsustained ventricular tachycardia) (HCC)    PVC's (premature ventricular contractions)    Substance abuse (HCC)    Past Surgical History:  Procedure Laterality Date   INGUINAL HERNIA REPAIR Bilateral    RIGHT/LEFT HEART CATH AND CORONARY ANGIOGRAPHY N/A 05/26/2019   Procedure: RIGHT/LEFT HEART CATH AND CORONARY ANGIOGRAPHY;  Surgeon: Dolores Patty, MD;  Location: [facility];  Service: Cardiovascular;  Laterality: N/A;   Current Outpatient Medications  Medication Sig Dispense Refill   aspirin EC 81 MG EC tablet Take 1 tablet (81 mg total) by mouth daily. 90 tablet 0   atorvastatin (LIPITOR) 40 MG tablet Take 40 mg by mouth daily at 6 PM.      carvedilol (COREG) 6.25 MG tablet Take 1 tablet by mouth twice daily 180 tablet 0   cholecalciferol (VITAMIN D3) 25 MCG (1000 UNIT) tablet Take 1,000  Units by mouth daily.     dapagliflozin propanediol (FARXIGA) 10 MG TABS tablet Take 1 tablet (10 mg total) by mouth daily before breakfast. 90 tablet 3   Dupilumab (DUPIXENT ) Inject 1 Dose into the skin every 14 (fourteen) days.     escitalopram (LEXAPRO) 10 MG tablet Take 10 mg by mouth daily.     metFORMIN (GLUCOPHAGE) 500 MG tablet Take 500 mg by mouth 2 (two) times daily with a meal.      omeprazole (PRILOSEC) 40 MG capsule Take 40 mg by mouth daily.     sacubitril-valsartan (ENTRESTO) 49-51 MG Take 1 tablet by mouth 2 (two) times daily. 60 tablet 11   spironolactone (ALDACTONE) 25 MG tablet TAKE 1 TABLET BY MOUTH AT BEDTIME 90 tablet 3   torsemide (DEMADEX) 20 MG tablet Take 20 mg by mouth as needed.     traMADol (ULTRAM-ER) 100 MG 24 hr tablet Take 100 mg by mouth daily as needed for pain.     vitamin C (ASCORBIC ACID) 500 MG tablet Take 500 mg by mouth daily.     No current facility-administered medications for this encounter.   Allergies:   Benadryl [diphenhydramine], Codeine, and Penicillins   Social History:  The patient  reports that he quit smoking about 33 years ago. His smoking use included cigarettes. He has never used smokeless tobacco. He reports that he does not currently use alcohol. He reports that he does not currently use drugs.   Family History:  The patient's family history includes CAD in his paternal uncle; Heart attack in his paternal grandfather; Heart disease in his father.   ROS:  Please see the history of present illness.   All other systems are personally reviewed and negative.   Vitals:   10/06/22 0850  BP: (!) 140/76  Pulse: 69  SpO2: 97%  Weight: 84 kg (185 lb 3.2 oz)  Height: 5\' 10"  (1.778 m)   Wt Readings from Last 3 Encounters:  10/06/22 84 kg (185 lb 3.2 oz)  04/30/22 81.9 kg (180 lb 9.6 oz)  01/06/22 75.8 kg (167 lb)   Exam:   General:  well appearing.  No respiratory difficulty. Walked into clinic HEENT: normal Neck: supple. JVD flat.  Carotids 2+ bilat; no bruits. No lymphadenopathy or thyromegaly appreciated. Cor: PMI nondisplaced. Regular rate & rhythm. No rubs, gallops or murmurs. Lungs: clear Abdomen: soft, nontender, nondistended. No hepatosplenomegaly. No bruits or masses. Good bowel sounds. Extremities: no cyanosis, clubbing, rash, edema  Neuro: alert & oriented x 3, cranial nerves grossly intact. moves all 4 extremities w/o difficulty. Affect pleasant.   ECG: No new EKG today  10/16/2021: TSH 2.668 10/17/2021: Hemoglobin 11.5; Magnesium 1.8; Platelets 129 11/21/2021: ALT 37; B Natriuretic Peptide 28.3; BUN 15; Creatinine, Ser 1.02; Potassium 4.6; Sodium 137    ASSESSMENT AND PLAN: 1. Chronic Systolic Heart Failure (NICM) -Echo @ [facility] 2/21 EF of 25 to 30% with severe global hypokinesis.  RV also noted to be  moderately enlarged with moderately impaired RV systolic function. -LHC 05/26/19 w/ mild, nonobstructive CAD. RHC w/ compensated hemodynamics  - cMRI 2/21 EF 18% w/ nonspecific inferoseptal RV insertion site LGE. ECV percentage elevated at 39%, can see w/ increased fibrosis with cardiomyopathy or myocarditis. Suspect PVC also contributing. PVCs now suppressed with amio.  - Echo 08/29/19 EF 40-45% (improved with PVC suppression) - Echo 04/24/20 EF 35-40% RV mildly HK. - cMRI 3/22 LVEF 40% RVEF 43% mild LGE ECV 38%  - Echo 12/28/20 EF 50-55%  - Echo 07/23: EF 45-50%, RV okay - Echo 12/26/21 EF 50-55% mild HK of mid to distal lateral.inferolateral walls  - Echo today, official read pending - Stable NYHA I-II Volume status stable. Continue PRN torsemide.  - Continue carvedilol 6.25 mg bid, HR 50s on echo - Continue Entresto 49/51 mg BID - Continue spironolactone 25 mg daily.  - Continue Farxiga 10 mg daily - Based on improvement in cMRI, suspect main issue was resolving myocarditis which prompted PVCs. Now that myocarditis improved PVCs have resolved. Doubt sarcoid as EF unlikely to get better without steroid  treatment - Management of PVCs as below - BMET/BNP today  2. PVCs - Zio 2/21 burden ~8-10% - PVCs suppressed with amio but EF has not normalized - Sleep study negative - Zio 2/22 Rare PVCs (<1%) - Amio stopped 9/22 - Zio 04/23: 16.5% PVC burden - Unable to tolerate mexiletine. Amio restarted then stopped in 7/23 TSH normal 07/23.  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 Now resolved. Continue to follow  3. Hypertension - BP well controlled. Now off amlodipine - Restart if SBP > 140.   4. Hyperlipidemia - Continue statin   5. Type 2 Diabetes Mellitus - Continue Metformin & Farxiga  - Followed by PCP. - Hgb A1c 7 9/22  6. COPD - PFTs 9/21 Mild obstruction FEV1 2.55L (77%)  7. Fatigue - check anemia panel  Follow up in 6 months with Dr. Gala Romney.   Signed, Alen Bleacher, NP  10/06/2022 9:04 AM Advanced Heart Failure Clinic [facility] [address] [facility] [place] [address] [phone] (office) [phone] (fax)

## 2022-10-06 ENCOUNTER — Ambulatory Visit (HOSPITAL_BASED_OUTPATIENT_CLINIC_OR_DEPARTMENT_OTHER)
Admission: RE | Admit: 2022-10-06 | Discharge: 2022-10-06 | Disposition: A | Discharge status: Home / Self Care | Payer: Medicare HMO | Source: Ambulatory Visit | Attending: Internal Medicine | Admitting: Internal Medicine

## 2022-10-06 ENCOUNTER — Other Ambulatory Visit (HOSPITAL_COMMUNITY): Payer: Self-pay

## 2022-10-06 ENCOUNTER — Ambulatory Visit (HOSPITAL_COMMUNITY)
Admission: RE | Admit: 2022-10-06 | Discharge: 2022-10-06 | Disposition: A | Discharge status: Home / Self Care | Payer: Medicare HMO | Source: Ambulatory Visit | Attending: Family Medicine | Admitting: Family Medicine

## 2022-10-06 ENCOUNTER — Encounter (HOSPITAL_COMMUNITY): Payer: Self-pay

## 2022-10-06 VITALS — BP 140/76 | HR 69 | Ht 70.0 in | Wt 185.2 lb

## 2022-10-06 DIAGNOSIS — I5022 Chronic systolic (congestive) heart failure: Secondary | ICD-10-CM | POA: Diagnosis not present

## 2022-10-06 DIAGNOSIS — E785 Hyperlipidemia, unspecified: Secondary | ICD-10-CM

## 2022-10-06 DIAGNOSIS — I5021 Acute systolic (congestive) heart failure: Secondary | ICD-10-CM

## 2022-10-06 DIAGNOSIS — I472 Ventricular tachycardia, unspecified: Secondary | ICD-10-CM | POA: Insufficient documentation

## 2022-10-06 DIAGNOSIS — E119 Type 2 diabetes mellitus without complications: Secondary | ICD-10-CM | POA: Insufficient documentation

## 2022-10-06 DIAGNOSIS — I10 Essential (primary) hypertension: Secondary | ICD-10-CM

## 2022-10-06 DIAGNOSIS — I493 Ventricular premature depolarization: Secondary | ICD-10-CM

## 2022-10-06 DIAGNOSIS — I081 Rheumatic disorders of both mitral and tricuspid valves: Secondary | ICD-10-CM | POA: Insufficient documentation

## 2022-10-06 DIAGNOSIS — I110 Hypertensive heart disease with heart failure: Secondary | ICD-10-CM | POA: Insufficient documentation

## 2022-10-06 DIAGNOSIS — J449 Chronic obstructive pulmonary disease, unspecified: Secondary | ICD-10-CM

## 2022-10-06 DIAGNOSIS — I4510 Unspecified right bundle-branch block: Secondary | ICD-10-CM | POA: Diagnosis not present

## 2022-10-06 DIAGNOSIS — I428 Other cardiomyopathies: Secondary | ICD-10-CM | POA: Diagnosis not present

## 2022-10-06 DIAGNOSIS — R5383 Other fatigue: Secondary | ICD-10-CM

## 2022-10-06 LAB — BASIC METABOLIC PANEL
Anion gap: 9 (ref 5–15)
BUN: 9 mg/dL (ref 8–23)
CO2: 24 mmol/L (ref 22–32)
Calcium: 9 mg/dL (ref 8.9–10.3)
Chloride: 106 mmol/L (ref 98–111)
Creatinine, Ser: 1 mg/dL (ref 0.61–1.24)
GFR, Estimated: >60 mL/min (ref >60)
Glucose, Bld: 140 mg/dL — ABNORMAL HIGH (ref 70–99)
Potassium: 4.5 mmol/L (ref 3.5–5.1)
Sodium: 139 mmol/L (ref 135–145)

## 2022-10-06 LAB — ECHOCARDIOGRAM COMPLETE
Area-P 1/2: 4.31 cm2
Calc EF: 54.8 %
S' Lateral: 3.7 cm
Single Plane A2C EF: 51.9 %
Single Plane A4C EF: 57.9 %

## 2022-10-06 LAB — BRAIN NATRIURETIC PEPTIDE: B Natriuretic Peptide: 69.6 pg/mL (ref 0.0–100.0)

## 2022-10-06 MED ORDER — CARVEDILOL 6.25 MG PO TABS: 6.2500 mg | ORAL_TABLET | Freq: Two times a day (BID) | ORAL | 0 refills | Status: DC

## 2022-10-06 NOTE — Patient Instructions (Signed)
No change in medications. Labs drawn today - will call you if abnormal. Return to see Dr. Gala Romney in 6 months. Please call us at [phone] in November to schedule this appointment. Refills sent for aspirin and coreg to your local pharmacy. Please call us at [phone] if any questions or concerns.

## 2022-10-06 NOTE — Progress Notes (Signed)
2D Echocardiogram has been performed.  Roy Duke 10/06/2022, 8:47 AM

## 2022-10-13 ENCOUNTER — Telehealth (HOSPITAL_COMMUNITY): Payer: Self-pay | Admitting: *Deleted

## 2022-10-13 NOTE — Telephone Encounter (Signed)
Patient called stating he accidentally took his dogs medications instead of his own. He said he took enalapril 5mg , furosemide 20mg , and gabapentin 100mg . Pt asked if he should hold his medications.   Routed to [employer]

## 2022-10-13 NOTE — Telephone Encounter (Signed)
Spoke with pt he is aware and agreeable 

## 2022-10-25 ENCOUNTER — Other Ambulatory Visit (HOSPITAL_COMMUNITY): Payer: Self-pay | Admitting: Physician Assistant

## 2023-02-02 ENCOUNTER — Encounter (HOSPITAL_COMMUNITY): Payer: Medicare HMO | Admitting: Internal Medicine

## 2023-02-04 ENCOUNTER — Other Ambulatory Visit (HOSPITAL_COMMUNITY): Payer: Self-pay | Admitting: *Deleted

## 2023-02-04 ENCOUNTER — Encounter (HOSPITAL_COMMUNITY): Payer: Self-pay | Admitting: Internal Medicine

## 2023-02-04 ENCOUNTER — Ambulatory Visit (HOSPITAL_COMMUNITY)
Admission: RE | Admit: 2023-02-04 | Discharge: 2023-02-04 | Disposition: A | Discharge status: Home / Self Care | Payer: Medicare HMO | Source: Ambulatory Visit | Attending: Internal Medicine | Admitting: Internal Medicine

## 2023-02-04 VITALS — BP 146/99 | HR 69 | Wt 183.8 lb

## 2023-02-04 DIAGNOSIS — I10 Essential (primary) hypertension: Secondary | ICD-10-CM

## 2023-02-04 DIAGNOSIS — I493 Ventricular premature depolarization: Secondary | ICD-10-CM | POA: Diagnosis not present

## 2023-02-04 DIAGNOSIS — I5022 Chronic systolic (congestive) heart failure: Secondary | ICD-10-CM

## 2023-02-04 MED ORDER — CARVEDILOL 6.25 MG PO TABS: 6.2500 mg | ORAL_TABLET | Freq: Two times a day (BID) | ORAL | 3 refills | Status: DC

## 2023-02-04 NOTE — Progress Notes (Signed)
Advanced HF Clinic Note  Date:  02/04/2023   ID:  Roy Duke, DOB 1950/10/21, MRN [id number]  Location: Home  Provider location: [facility] Type of Visit: Established patient  PCP:  Galvin Proffer, MD  Cardiologist:  None Primary HF: Roy Duke   History of Present Illness: Roy Duke is a 72 y.o. male with HTN, HL, DM2, former tobacco/ETOH use (quit 30 years ago) and systolic HF due to NICM.  Admitted 12/20 to [facility] with severe hyponatremia and HF.  Echo EF 25%. Developed NSVT with polymorphic ectopy. ECG with RBBB and new lateral TWI. He was transferred to [facility].   LHC w/ mild, nonobstructive CAD. Well compensated hemodynamics on RHC. cMRi EF 28% w/ nonspecific inferoseptal RV insertion site LGE. ECV percentage elevated at 39%. 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  He was started on amiodarone for suppression of PVCs and fitted w/ a LifeVest prior to discharge.    -Sleep study 3/21 AHI 2.3 ]  -Echo 5/21: EF 40-45% -Echo 1/22 EF 35-40% RV mildly HK. Personally reviewed   cMRI 3/22 - repeated due to persistent LV dysfunction despite PVC suppression 1. LVEF 40% ECV = 38%.Midwall LGE in the basal anterior and anteroseptal walls, 2. RVEF 43%  3. When compared to the prior study from 05/29/2019, there is decrease in size in both ventricles as well as improvement in biventricular systolic function. ECV is 38%. There are 2 segments of midwall nonspecific LGE that might be associated with idiopathic NICM. Consider sarcoid   Echo (12/28/20): EF 50-55%  Amio stopped 09/22.  Cardiac monitor 04/23: 16.5% PVC burden. He was unable to tolerate mexiletine and was started on amiodarone. Saw Dr. Johney Frame and PVC ablation to be considered if PVC burden did not improve with amiodarone. Amio stopped  Echo 7/23 EF 45-50%, RV okay.  zio 07/23: SR with < 1% PVC burden Echo 12/26/21 EF 50-55% mild HK of mid to distal  lateral/inferolateral walls G1DD   Saw EP ([name]) 10/23 . No PVCs on ECG. Watchful waiting. If PVCs recur -> consider ablation   Echo 7/24 EF 45-50%   Today he returns for AHF follow up with his wife. Feels ok but not great. Can do all ADLs and go out and do projects without too much difficulty but doesn't have much energy. Wife says he snores a lot. Struggling with his memory. SBP at home variable 115-160    Past Medical History:  Diagnosis Date   Anxiety    Asthma    CHF (congestive heart failure) (HCC)    Diabetes mellitus without complication (HCC)    GERD (gastroesophageal reflux disease)    Hyperlipidemia    Hypertension    NSVT (nonsustained ventricular tachycardia) (HCC)    PVC's (premature ventricular contractions)    Substance abuse (HCC)    Past Surgical History:  Procedure Laterality Date   INGUINAL HERNIA REPAIR Bilateral    RIGHT/LEFT HEART CATH AND CORONARY ANGIOGRAPHY N/A 05/26/2019   Procedure: RIGHT/LEFT HEART CATH AND CORONARY ANGIOGRAPHY;  Surgeon: Dolores Patty, MD;  Location: [facility] INVASIVE CV LAB;  Service: Cardiovascular;  Laterality: N/A;   Current Outpatient Medications  Medication Sig Dispense Refill   aspirin EC 81 MG EC tablet Take 1 tablet (81 mg total) by mouth daily. 90 tablet 0   atorvastatin (LIPITOR) 40 MG tablet Take 40 mg by mouth daily at 6 PM.      carvedilol (COREG) 6.25 MG  tablet Take 1 tablet (6.25 mg total) by mouth 2 (two) times daily. 180 tablet 0   cholecalciferol (VITAMIN D3) 25 MCG (1000 UNIT) tablet Take 1,000 Units by mouth daily.     dapagliflozin propanediol (FARXIGA) 10 MG TABS tablet Take 1 tablet (10 mg total) by mouth daily before breakfast. 90 tablet 3   Dupilumab (DUPIXENT Mathews) Inject 1 Dose into the skin every 14 (fourteen) days.     ENTRESTO 49-51 MG Take 1 tablet by mouth twice daily 60 tablet 0   escitalopram (LEXAPRO) 10 MG tablet Take 10 mg by mouth daily.     metFORMIN (GLUCOPHAGE) 500 MG tablet Take 500 mg by  mouth 2 (two) times daily with a meal.      omeprazole (PRILOSEC) 40 MG capsule Take 40 mg by mouth daily.     spironolactone (ALDACTONE) 25 MG tablet TAKE 1 TABLET BY MOUTH AT BEDTIME 90 tablet 3   torsemide (DEMADEX) 20 MG tablet Take 20 mg by mouth as needed.     traMADol (ULTRAM-ER) 100 MG 24 hr tablet Take 100 mg by mouth daily as needed for pain.     vitamin C (ASCORBIC ACID) 500 MG tablet Take 500 mg by mouth daily.     No current facility-administered medications for this encounter.   Allergies:   Benadryl [diphenhydramine], Codeine, and Penicillins   Social History:  The patient  reports that he quit smoking about 33 years ago. His smoking use included cigarettes. He has never used smokeless tobacco. He reports that he does not currently use alcohol. He reports that he does not currently use drugs.   Family History:  The patient's family history includes CAD in his paternal uncle; Heart attack in his paternal grandfather; Heart disease in his father.   ROS:  Please see the history of present illness.   All other systems are personally reviewed and negative.   Vitals:   02/04/23 0909  BP: (!) 176/104  Pulse: 69  SpO2: 97%  Weight: 83.4 kg (183 lb 12.8 oz)   Wt Readings from Last 3 Encounters:  02/04/23 83.4 kg (183 lb 12.8 oz)  10/06/22 84 kg (185 lb 3.2 oz)  04/30/22 81.9 kg (180 lb 9.6 oz)   Exam:   General:  Well appearing. No resp difficulty HEENT: normal Neck: supple. no JVD. Carotids 2+ bilat; no bruits. No lymphadenopathy or thryomegaly appreciated. Cor: PMI nondisplaced. Regular rate & rhythm. No rubs, gallops or murmurs. Lungs: clear Abdomen: soft, nontender, nondistended. No hepatosplenomegaly. No bruits or masses. Good bowel sounds. Extremities: no cyanosis, clubbing, rash, edema Neuro: alert & orientedx3, cranial nerves grossly intact. moves all 4 extremities w/o difficulty. Affect pleasant   ECG: NSR 67 RBBB + 2 PVCs Personally reviewed   10/06/2022: B  Natriuretic Peptide 69.6; BUN 9; Creatinine, Ser 1.00; Potassium 4.5; Sodium 139    ASSESSMENT AND PLAN: 1. Chronic Systolic Heart Failure (NICM) -Echo @ [facility] 2/21 EF of 25 to 30% with severe global hypokinesis.  RV also noted to be moderately enlarged with moderately impaired RV systolic function. -LHC 05/26/19 w/ mild, nonobstructive CAD. RHC w/ compensated hemodynamics  - cMRI 2/21 EF 18% w/ nonspecific inferoseptal RV insertion site LGE. ECV percentage elevated at 39%, can see w/ increased fibrosis with cardiomyopathy or myocarditis. Suspect PVC also contributing. PVCs now suppressed with amio.  - Echo 08/29/19 EF 40-45% (improved with PVC suppression) - Echo 04/24/20 EF 35-40% RV mildly HK. - cMRI 3/22 LVEF 40% RVEF 43% mild LGE ECV  38%  - Echo 12/28/20 EF 50-55%  - Echo 07/23: EF 45-50%, RV okay - Echo 12/26/21 EF 50-55% mild HK of mid to distal lateral.inferolateral walls  - Echo 7/24 EF 45-50% - Stable NYHA II-III Suspect mostly non-cardiac limitation - Volume ok. Takes torsemide as needed - Continue carvedilol 6.25 mg bid, - Continue Entresto 49/51 mg BID - Continue spironolactone 25 mg daily.  - Continue Farxiga 10 mg daily - Based on improvement in cMRI, suspect main issue was resolving myocarditis which prompted PVCs. Now that myocarditis improved PVCs have resolved. Doubt sarcoid as EF unlikely to get better without steroid treatment - Will get CPX test to further evaluate  2. PVCs - Zio 2/21 burden ~8-10% - Did not tolerate mexilitene. PVCs suppressed with amio but EF did not normalized. Amio now off (stopped 9/22) - Sleep study negative - Zio 2/22 Rare PVCs (<1%) - Zio 04/23: 16.5% PVC burden - Unable to tolerate mexiletine. Amio restarted then stopped in 7/23 TSH normal 07/23.  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 Now resolved. EF improved but still with NYHA II-III symptoms. I worry some of this may be related to  anxiety/depression. Will get CPX to further assess   3. Hypertension - BP improved, Now off amlodipine - Restart if SBP > 140.   4. Hyperlipidemia - Continue statin - PCP following   5. Type 2 Diabetes Mellitus - Continue Metformin & Farxiga  - Followed by PCP. - Hgb A1c 7 9/22  6. COPD - PFTs 9/21 Mild obstruction FEV1 2.55L (77%)   Signed, Arvilla Meres, MD  02/04/2023 10:00 AM [facility] [address] [facility] [place] [address] [phone] (office) [phone] (fax)

## 2023-02-04 NOTE — Patient Instructions (Addendum)
There has been no changes to your medications.  You will be called to have this test arranged.   Expect to be in the lab for 2 hours. Please plan to arrive 30 minutes prior to your appointment. You may be asked to reschedule your test if you arrive 20 minutes or more after your scheduled appointment time.  Main Campus address: [address] [place], [place] [address] You may arrive to the Main Entrance A or Entrance C (free valet parking is available at both). -Main Entrance A (on [address]) :proceed to admitting for check in -Entrance C (on [facility]): proceed to [employer] parking or under hospital deck parking using this code _________  Check In: [facility] waiting room (1st floor)   General Instructions for the day of the test (Please follow all instructions from your physician): Refrain from ingesting a heavy meal, alcohol, or caffeine or using tobacco products within 2 hours of the test (DO NOT FAST for mare than 8 hours). You may have all other non-alcoholic, non -caffeinated beverage,a light snack (crackers,a piece of fruit, carrot sticks, toast bagel,etc) up to your appointment. Avoid significant exertion or exercise within 24 hours of your test. Be prepared to exercise and sweat. Your clothing should permit freedom of movement and include walking or running shoes. Women bring loose fitting short sleeved blouse.  This evaluation may be fatiguing and you may wish ti have someone accompany you to the assessment to drive you home afterward. Bring a list of your medications with you, including dosage and frequency you take the medications (  I.e.,once per day, twice per day, etc). Take all medications as prescribed, unless noted below or instructed to do so by your physician.  Please do not take the following medications prior to your CPX:  _________________________________________________  _________________________________________________  Brief description of the  test: A brief lung test will be performed. This will involve you taking deep breaths and blowing hard and fast through your mouth. During these , a clip will be on your nose and you will be breathing through a breathing device.   For the exercise portion of the test you will be walking on a treadmill, or riding a stationary bike, to your maximal effor or until symptoms such as chest pain, shortness of breath, leg pain or dizziness limit your exercise. You will be breathing in and out of a breathing device through your mouth (a clip will be on your nose again). Your heart rate, ECG, blood pressure, oxygen saturations, breathing rate and depth, amount of oxygen you consume and amount of carbon dioxide you produce will be measured and monitored throughout the exercise test.  If you need to cancel or reschedule your appointment please call [phone] If you have further questions please call your physician or Roy Aspen, Roy Duke, Roy Duke at [phone]  PLEASE CHECK YOUR BLOOD PRESSURE DAILY FOR 3 WEEKS.  Your physician recommends that you schedule a follow-up appointment in: 4 MONTHS ( April 2025) ** PLEASE CALL THE OFFICE IN [place] TO ARRANGE YOUR FOLLOW UP APPOINTMENT. **  If you have any questions or concerns before your next appointment please send [place] a message through [place] or call our office at [phone].    TO LEAVE A MESSAGE FOR THE NURSE SELECT OPTION 2, PLEASE LEAVE A MESSAGE INCLUDING: YOUR NAME DATE OF BIRTH CALL BACK NUMBER REASON FOR CALL**this is important as we prioritize the call backs  YOU WILL RECEIVE A CALL BACK THE SAME DAY AS  LONG AS YOU CALL BEFORE 4:00 PM  At the [facility], you and your health needs are our priority. As part of our continuing mission to provide you with exceptional heart care, we have created designated Provider Care Teams. These Care Teams include your primary Cardiologist (physician) and Advanced Practice Providers (APPs-  Physician Assistants and Nurse Practitioners) who all work together to provide you with the care you need, when you need it.   You may see any of the following providers on your designated Care Team at your next follow up: Dr Arvilla Meres Dr Marca Ancona Dr. Dorthula Nettles Dr. Clearnce Hasten Amy Filbert Schilder, NP Robbie Lis, [place] [facility], [place] Brynda Peon, NP [place] Lee, NP Karle Plumber, PharmD   Please be sure to bring in all your medications bottles to every appointment.    Thank you for choosing [facility]

## 2023-02-10 ENCOUNTER — Other Ambulatory Visit (HOSPITAL_COMMUNITY): Payer: Self-pay | Admitting: Internal Medicine

## 2023-02-23 ENCOUNTER — Ambulatory Visit (HOSPITAL_COMMUNITY): Payer: Medicare HMO | Attending: Internal Medicine

## 2023-02-23 DIAGNOSIS — I5022 Chronic systolic (congestive) heart failure: Secondary | ICD-10-CM | POA: Insufficient documentation

## 2023-02-23 DIAGNOSIS — I428 Other cardiomyopathies: Secondary | ICD-10-CM | POA: Diagnosis not present

## 2023-02-23 DIAGNOSIS — E785 Hyperlipidemia, unspecified: Secondary | ICD-10-CM | POA: Diagnosis not present

## 2023-02-23 DIAGNOSIS — J45909 Unspecified asthma, uncomplicated: Secondary | ICD-10-CM | POA: Diagnosis not present

## 2023-02-23 DIAGNOSIS — F17210 Nicotine dependence, cigarettes, uncomplicated: Secondary | ICD-10-CM | POA: Insufficient documentation

## 2023-02-23 DIAGNOSIS — I110 Hypertensive heart disease with heart failure: Secondary | ICD-10-CM | POA: Insufficient documentation

## 2023-02-23 DIAGNOSIS — E119 Type 2 diabetes mellitus without complications: Secondary | ICD-10-CM | POA: Insufficient documentation

## 2023-02-23 DIAGNOSIS — R0609 Other forms of dyspnea: Secondary | ICD-10-CM | POA: Insufficient documentation

## 2023-02-23 DIAGNOSIS — I493 Ventricular premature depolarization: Secondary | ICD-10-CM | POA: Insufficient documentation

## 2023-02-23 DIAGNOSIS — F419 Anxiety disorder, unspecified: Secondary | ICD-10-CM | POA: Diagnosis not present

## 2023-02-23 DIAGNOSIS — I4729 Other ventricular tachycardia: Secondary | ICD-10-CM | POA: Diagnosis not present

## 2023-03-08 ENCOUNTER — Other Ambulatory Visit (HOSPITAL_COMMUNITY): Payer: Self-pay | Admitting: Internal Medicine

## 2023-03-09 ENCOUNTER — Telehealth (HOSPITAL_COMMUNITY): Payer: Self-pay | Admitting: Pharmacy Technician

## 2023-03-09 NOTE — Telephone Encounter (Signed)
Advanced Heart Failure Patient Advocate Encounter    Patient was automatically approved to receive Farxiga from [employer] through 04/06/24   Document scanned to chart. Patient will be notified via mail. Patient currently using the assistance through previous enrollment end date. Sent 90 day RX request to Chantel (CMA) to send to [facility]. No further action needed at this time.   Archer Asa, CPhT

## 2023-03-10 MED ORDER — DAPAGLIFLOZIN PROPANEDIOL 10 MG PO TABS: 10.0000 mg | ORAL_TABLET | Freq: Every day | ORAL | 3 refills | Status: AC

## 2023-03-10 NOTE — Addendum Note (Signed)
Addended by: Theresia Bough on: 03/10/2023 11:40 AM   Modules accepted: Orders

## 2023-03-10 NOTE — Telephone Encounter (Signed)
 SCRIPT SENT

## 2023-03-27 ENCOUNTER — Telehealth (HOSPITAL_COMMUNITY): Payer: Self-pay | Admitting: *Deleted

## 2023-03-27 NOTE — Telephone Encounter (Signed)
Pt dropped off BP log, readings range from 120s-130s over 80s per Dr Gala Romney no changes continue to monitor  Pt's wife is aware, agreeable, and verbalized understanding

## 2023-05-29 ENCOUNTER — Other Ambulatory Visit (HOSPITAL_COMMUNITY): Payer: Self-pay

## 2023-06-08 ENCOUNTER — Telehealth (HOSPITAL_COMMUNITY): Payer: Self-pay

## 2023-06-08 NOTE — Telephone Encounter (Signed)
 Received call from patient and patients wife who state that patients BP has been spiking in the morning time. Patient reports that he wakes up with a headache and not feeling well and his blood pressure this morning was 165/137- reports that he took him morning medications, and now his blood pressure is back to normal in the 1teens/70's patient reports. Patient states he is taking all medications as prescribed including recently starting clonidine 0.1mg  twice daily. Patient would like to be seen to discuss. Scheduled patient appointment for next week to review. Patient will bring blood pressure log.   Advised patient to check his blood pressure 45 mins-1 hour after taking medications and writing that number down. Patient verbalized understanding.   Patient would like to know if there are any changes that need to be made to his blood pressure medications before he is seen next week in clinic. Advised patient I would forward message- patient verbalized understanding.

## 2023-06-08 NOTE — Telephone Encounter (Signed)
 Robbie Lis M, PA-C  You; Bensimhon, Bevelyn Buckles, MD1 hour ago (11:07 AM)    Agree w/ arranging follow up visit. Have him bring log of home readings and all prescribed meds to appt.  We can reassess at visit and screen to see if we need to repeat a sleep study. Also advise that patient monitor diet and avoid high sodium foods.  Make sure that he is fully compliant w/ clonidine. Missed doses of clonidine can result in "rebound hypertension", which is an elevation of BP after a missed dose.    Made patient aware of the above and he verbalized understanding of all instructions. Advised patient to call back to office with any issues, questions, or concerns. Patient verbalized understanding.

## 2023-06-17 ENCOUNTER — Ambulatory Visit (HOSPITAL_COMMUNITY)
Admission: RE | Admit: 2023-06-17 | Discharge: 2023-06-17 | Disposition: A | Discharge status: Home / Self Care | Source: Ambulatory Visit | Attending: Physician Assistant | Admitting: Physician Assistant

## 2023-06-17 ENCOUNTER — Encounter (HOSPITAL_COMMUNITY): Payer: Self-pay

## 2023-06-17 VITALS — BP 132/82 | HR 69 | Ht 70.0 in | Wt 183.4 lb

## 2023-06-17 DIAGNOSIS — Z7984 Long term (current) use of oral hypoglycemic drugs: Secondary | ICD-10-CM | POA: Diagnosis not present

## 2023-06-17 DIAGNOSIS — F1091 Alcohol use, unspecified, in remission: Secondary | ICD-10-CM | POA: Diagnosis not present

## 2023-06-17 DIAGNOSIS — E119 Type 2 diabetes mellitus without complications: Secondary | ICD-10-CM | POA: Insufficient documentation

## 2023-06-17 DIAGNOSIS — R413 Other amnesia: Secondary | ICD-10-CM | POA: Insufficient documentation

## 2023-06-17 DIAGNOSIS — I428 Other cardiomyopathies: Secondary | ICD-10-CM | POA: Insufficient documentation

## 2023-06-17 DIAGNOSIS — E785 Hyperlipidemia, unspecified: Secondary | ICD-10-CM | POA: Diagnosis not present

## 2023-06-17 DIAGNOSIS — I10 Essential (primary) hypertension: Secondary | ICD-10-CM | POA: Diagnosis not present

## 2023-06-17 DIAGNOSIS — I5022 Chronic systolic (congestive) heart failure: Secondary | ICD-10-CM | POA: Diagnosis present

## 2023-06-17 DIAGNOSIS — I110 Hypertensive heart disease with heart failure: Secondary | ICD-10-CM | POA: Insufficient documentation

## 2023-06-17 DIAGNOSIS — J449 Chronic obstructive pulmonary disease, unspecified: Secondary | ICD-10-CM | POA: Insufficient documentation

## 2023-06-17 DIAGNOSIS — Z87891 Personal history of nicotine dependence: Secondary | ICD-10-CM | POA: Insufficient documentation

## 2023-06-17 DIAGNOSIS — I493 Ventricular premature depolarization: Secondary | ICD-10-CM | POA: Insufficient documentation

## 2023-06-17 DIAGNOSIS — I2510 Atherosclerotic heart disease of native coronary artery without angina pectoris: Secondary | ICD-10-CM | POA: Insufficient documentation

## 2023-06-17 MED ORDER — CLONIDINE HCL 0.1 MG PO TABS: 0.1000 mg | ORAL_TABLET | Freq: Every day | ORAL | 11 refills | Status: DC

## 2023-06-17 MED ORDER — ENTRESTO 97-103 MG PO TABS: 1.0000 | ORAL_TABLET | Freq: Two times a day (BID) | ORAL | 11 refills | Status: AC

## 2023-06-17 NOTE — Progress Notes (Addendum)
 Advanced HF Clinic Note  PCP:  Galvin Proffer, MD  Cardiologist:  None Primary HF: Bensimhon   History of Present Illness: Roy Duke is a 73 y.o. male with HTN, HL, DM2, former tobacco/ETOH use (quit 30 years ago), systolic HF due to NICM, memory impairment, COPD  Admitted 12/20 to [facility] with severe hyponatremia and HF.  Echo EF 25%. Developed NSVT with polymorphic ectopy. ECG with RBBB and new lateral TWI. He was transferred to [facility].   LHC w/ mild, nonobstructive CAD. Well compensated hemodynamics on RHC. cMRi EF 28% w/ nonspecific inferoseptal RV insertion site LGE. ECV percentage elevated at 39%. 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  He was started on amiodarone for suppression of PVCs.   -Sleep study 3/21 AHI 2.3 ]  -Echo 5/21: EF 40-45% -Echo 1/22 EF 35-40% RV mildly HK. Personally reviewed   cMRI 3/22 - repeated due to persistent LV dysfunction despite PVC suppression 1. LVEF 40% ECV = 38%.Midwall LGE in the basal anterior and anteroseptal walls, 2. RVEF 43%  3. When compared to the prior study from 05/29/2019, there is decrease in size in both ventricles as well as improvement in biventricular systolic function. ECV is 38%. There are 2 segments of midwall nonspecific LGE that might be associated with idiopathic NICM. Consider sarcoid   Echo (12/28/20): EF 50-55%  Amio stopped 09/22.  Cardiac monitor 04/23: 16.5% PVC burden. He was unable to tolerate mexiletine and was started on amiodarone. Saw Dr. Johney Frame and PVC ablation to be considered if PVC burden did not improve with amiodarone. Amio stopped  Echo 7/23 EF 45-50%, RV okay.  zio 07/23: SR with < 1% PVC burden Echo 12/26/21 EF 50-55% mild HK of mid to distal lateral/inferolateral walls G1DD   Saw EP ([name]) 10/23 . No PVCs on ECG. Watchful waiting. If PVCs recur -> consider ablation   Echo 7/24 EF 45-50%   CPX 11/24:  Resting HR: 78 Standing HR: 80 Peak HR: 150   (101% age  predicted max HR)  BP rest: 140/84 Standing BP: 144/80 BP peak: 196/74   Peak VO2: 21.1 (79% predicted peak VO2)  VE/VCO2 slope:  33  OUES: 1.80  Peak RER: 1.11  Ventilatory Threshold: 17.1 (64% predicted or measured peak VO2)  Peak RR 29  Peak Ventilation:  65.6  VE/MVV:  75%  PETCO2 at peak:  34  O2pulse:  11   (73% predicted O2pulse)   Mild HF limitation and mild restriction   Patient called 03/03 with BP spikes in the morning. Wife is present and assists with the history. Had recently been started on PRN clonidine 0.1 mg BID by PCP in February. BP has been spiking at night into the morning hours. Tends to average 150s systolic in am, improves to 120s-130s by afternoon. He has been walking 1-2.5 miles most days. No chest pain, shortness of breath, orthopnea, PND or lower extremity edema. He has cut back his sodium intake over the last few weeks.  No ETOH or tobacco use.  Past Medical History:  Diagnosis Date   Anxiety    Asthma    CHF (congestive heart failure) (HCC)    Diabetes mellitus without complication (HCC)    GERD (gastroesophageal reflux disease)    Hyperlipidemia    Hypertension    NSVT (nonsustained ventricular tachycardia) (HCC)    PVC's (premature ventricular contractions)    Substance abuse (HCC)    Past Surgical History:  Procedure Laterality Date  INGUINAL HERNIA REPAIR Bilateral    RIGHT/LEFT HEART CATH AND CORONARY ANGIOGRAPHY N/A 05/26/2019   Procedure: RIGHT/LEFT HEART CATH AND CORONARY ANGIOGRAPHY;  Surgeon: Dolores Patty, MD;  Location: [facility] INVASIVE CV LAB;  Service: Cardiovascular;  Laterality: N/A;   Current Outpatient Medications  Medication Sig Dispense Refill   aspirin EC 81 MG EC tablet Take 1 tablet (81 mg total) by mouth daily. 90 tablet 0   atorvastatin (LIPITOR) 40 MG tablet Take 40 mg by mouth daily at 6 PM.      carvedilol (COREG) 6.25 MG tablet Take 1 tablet (6.25 mg total) by mouth 2 (two) times daily. 180 tablet 3    cholecalciferol (VITAMIN D3) 25 MCG (1000 UNIT) tablet Take 1,000 Units by mouth daily.     dapagliflozin propanediol (FARXIGA) 10 MG TABS tablet Take 1 tablet (10 mg total) by mouth daily before breakfast. 90 tablet 3   Dupilumab (DUPIXENT [place]) Inject 1 Dose into the skin every 14 (fourteen) days.     escitalopram (LEXAPRO) 10 MG tablet Take 10 mg by mouth daily.     metFORMIN (GLUCOPHAGE) 500 MG tablet Take 500 mg by mouth 2 (two) times daily with a meal.      omeprazole (PRILOSEC) 40 MG capsule Take 40 mg by mouth daily.     spironolactone (ALDACTONE) 25 MG tablet TAKE 1 TABLET BY MOUTH AT BEDTIME 90 tablet 3   torsemide (DEMADEX) 20 MG tablet Take 20 mg by mouth as needed.     traMADol (ULTRAM-ER) 100 MG 24 hr tablet Take 100 mg by mouth daily as needed for pain.     vitamin C (ASCORBIC ACID) 500 MG tablet Take 500 mg by mouth daily.     cloNIDine (CATAPRES) 0.1 MG tablet Take 1 tablet (0.1 mg total) by mouth daily. 60 tablet 11   sacubitril-valsartan (ENTRESTO) 97-103 MG Take 1 tablet by mouth 2 (two) times daily. 60 tablet 11   No current facility-administered medications for this encounter.   Allergies:   Benadryl [diphenhydramine], Codeine, and Penicillins   Social History:  The patient  reports that he quit smoking about 34 years ago. His smoking use included cigarettes. He has never used smokeless tobacco. He reports that he does not currently use alcohol. He reports that he does not currently use drugs.   Family History:  The patient's family history includes CAD in his paternal uncle; Heart attack in his paternal grandfather; Heart disease in his father.   ROS:  Please see the history of present illness.   All other systems are personally reviewed and negative.   Vitals:   06/17/23 0915  BP: 132/82  Pulse: 69  SpO2: 98%  Weight: 83.2 kg (183 lb 6.4 oz)  Height: 5\' 10"  (1.778 m)    Wt Readings from Last 3 Encounters:  06/17/23 83.2 kg (183 lb 6.4 oz)  02/04/23 83.4 kg (183  lb 12.8 oz)  10/06/22 84 kg (185 lb 3.2 oz)   Exam:   General:  Well appearing.  Neck: no JVD.  Cor: Regular rate & rhythm. No rubs, gallops or murmurs. Lungs: clear Abdomen: soft, nontender, nondistended.  Extremities: no edema Neuro: alert & orientedx3. Affect pleasant   Labs 10/06/2022: B Natriuretic Peptide 69.6; BUN 9; Creatinine, Ser 1.00; Potassium 4.5; Sodium 139    ASSESSMENT AND PLAN: 1. Chronic Systolic Heart Failure (NICM) -Echo @ [facility] 2/21 EF of 25 to 30% with severe global hypokinesis.  RV also noted to be moderately enlarged with moderately  impaired RV systolic function. -LHC 05/26/19 w/ mild, nonobstructive CAD. RHC w/ compensated hemodynamics  - cMRI 2/21 EF 18% w/ nonspecific inferoseptal RV insertion site LGE. ECV percentage elevated at 39%, can see w/ increased fibrosis with cardiomyopathy or myocarditis. Suspect PVC also contributing. PVCs now suppressed with amio.  - Echo 08/29/19 EF 40-45% (improved with PVC suppression) - Echo 04/24/20 EF 35-40% RV mildly HK. 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 Now that myocarditis improved PVCs have resolved. Doubt sarcoid as EF unlikely to get better without steroid treatment - NYHA II. Volume looks good. Continue Torsemide as needed. - Continue carvedilol 6.25 mg bid, - Increase Entresto to 97/103 d/t elevated BP (see discussion below). BMET/BNP in 2 weeks - Continue spironolactone 25 mg daily.  - Continue Farxiga 10 mg daily - Repeat echo at f/u in 4 months  2. PVCs - Zio 2/21 burden ~8-10% - Did not tolerate mexilitene. PVCs suppressed with amio but EF did not normalized. Amio now off (stopped 9/22) - Sleep study  negative - Zio 2/22 Rare PVCs (<1%) - Zio 04/23: 16.5% PVC burden - Unable to tolerate mexiletine. Amio restarted then stopped in 7/23 TSH normal 07/23.  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   3. Hypertension - BP has been elevated with spikes especially in evening. - Recently started on clonidine 0.1 mg BID. Discussed that clonidine can be associated with rebound hypertension. Will titrate his other medications and wean clonidine off over the next few weeks. - Increase Entresto as above and drop clonidine to 0.1 mg daily - Continue with regular exercise. Reinforced limiting sodium intake. - F/u 4 weeks with PharmD. Plan to stop clonidine at that time. Can then increase coreg or add back amlodipine if needed.  4. Hyperlipidemia - Continue statin - PCP following   5. Type 2 Diabetes Mellitus - Continue Metformin & Farxiga  - Followed by PCP.    Follow-up: 4 weeks with PharmD, 4 months with APP with echo  [facility], Chane Magner N, PA-C  06/17/2023 9:41 AM

## 2023-06-17 NOTE — Patient Instructions (Addendum)
 INCREASE Entresto to 97/103 mg Twice daily  DECREASE Clonidine to 0.1 mg daily.  Blood work in 2 weeks at the [facility] in [place] on the 3rd floor.  Your physician has requested that you have an echocardiogram. Echocardiography is a painless test that uses sound waves to create images of your heart. It provides your doctor with information about the size and shape of your heart and how well your heart's chambers and valves are working. This procedure takes approximately one hour. There are no restrictions for this procedure. Please do NOT wear cologne, perfume, aftershave, or lotions (deodorant is allowed). Please arrive 15 minutes prior to your appointment time.  Please note: We ask at that you not bring children with you during ultrasound (echo/ vascular) testing. Due to room size and safety concerns, children are not allowed in the ultrasound rooms during exams. Our front office staff cannot provide observation of children in our lobby area while testing is being conducted. An adult accompanying a patient to their appointment will only be allowed in the ultrasound room at the discretion of the ultrasound technician under special circumstances. We apologize for any inconvenience.  Please follow up with our heart failure pharmacist in 4 weeks.  Your physician recommends that you schedule a follow-up appointment in: 4 months with an echocardiogram.  If you have any questions or concerns before your next appointment please send [place] a message through [place] or call our office at [phone].    TO LEAVE A MESSAGE FOR THE NURSE SELECT OPTION 2, PLEASE LEAVE A MESSAGE INCLUDING: YOUR NAME DATE OF BIRTH CALL BACK NUMBER REASON FOR CALL**this is important as we prioritize the call backs  YOU WILL RECEIVE A CALL BACK THE SAME DAY AS LONG AS YOU CALL BEFORE 4:00 PM  At the Advanced Heart Failure Clinic, you and your health needs are our priority. As part of our continuing mission to  provide you with exceptional heart care, we have created designated Provider Care Teams. These Care Teams include your primary Cardiologist (physician) and Advanced Practice Providers (APPs- Physician Assistants and Nurse Practitioners) who all work together to provide you with the care you need, when you need it.   You may see any of the following providers on your designated Care Team at your next follow up: Dr Arvilla Meres Dr Marca Ancona Dr. Dorthula Nettles Dr. Clearnce Hasten Amy Filbert Schilder, NP Robbie Lis, [place] [facility] [place], [place] Brynda Peon, NP [place] Lee, NP Clarisa Kindred, NP Karle Plumber, PharmD Enos Fling, PharmD   Please be sure to bring in all your medications bottles to every appointment.    Thank you for choosing [facility]

## 2023-07-01 ENCOUNTER — Other Ambulatory Visit (HOSPITAL_COMMUNITY): Payer: Self-pay

## 2023-07-01 ENCOUNTER — Other Ambulatory Visit (HOSPITAL_COMMUNITY): Payer: Self-pay | Admitting: Cardiology

## 2023-07-01 DIAGNOSIS — I5022 Chronic systolic (congestive) heart failure: Secondary | ICD-10-CM

## 2023-07-01 NOTE — Progress Notes (Signed)
 Lab orders placed per LF-PA for patient to have done at [facility] today.

## 2023-07-03 LAB — BASIC METABOLIC PANEL WITH GFR
BUN/Creatinine Ratio: 13 (ref 10–24)
BUN: 15 mg/dL (ref 8–27)
CO2: 19 mmol/L — ABNORMAL LOW (ref 20–29)
Calcium: 9.5 mg/dL (ref 8.6–10.2)
Chloride: 102 mmol/L (ref 96–106)
Creatinine, Ser: 1.17 mg/dL (ref 0.76–1.27)
Glucose: 177 mg/dL — ABNORMAL HIGH (ref 70–99)
Potassium: 4.8 mmol/L (ref 3.5–5.2)
Sodium: 138 mmol/L (ref 134–144)
eGFR: 66 mL/min/{1.73_m2} (ref >59)

## 2023-07-03 LAB — BRAIN NATRIURETIC PEPTIDE: BNP: 46 pg/mL (ref 0.0–100.0)

## 2023-07-14 NOTE — Progress Notes (Incomplete)
 ***In Progress***    Advanced Heart Failure Clinic Note  PCP:  Galvin Proffer, MD     Cardiologist:  None Primary HF: Bensimhon  HPI:  Roy Duke is a 73 y.o. male with HTN, HL, DM2, former tobacco/ETOH use (quit 30 years ago), systolic HF due to NICM, memory impairment, COPD   Admitted 12/20 to [facility] with severe hyponatremia and HF.  Echo EF 25%. Developed NSVT with polymorphic ectopy. ECG with RBBB and new lateral TWI. He was transferred to [facility].   LHC w/ mild, nonobstructive CAD. Well compensated hemodynamics on RHC. cMRi EF 28% w/ nonspecific inferoseptal RV insertion site LGE. ECV percentage elevated at 39%. 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  He was started on amiodarone for suppression of PVCs.   -Sleep study 06/2019: AHI 2.3   -Echo 08/2019: EF 40-45% -Echo 04/2020 EF 35-40% RV mildly HK. Personally reviewed    cMRI 06/2020 - repeated due to persistent LV dysfunction despite PVC suppression 1. LVEF 40% ECV = 38%.Midwall LGE in the basal anterior and anteroseptal walls, 2. RVEF 43%  3. When compared to the prior study from 05/29/2019, there is decrease in size in both ventricles as well as improvement in biventricular systolic function. ECV is 38%. There are 2 segments of midwall nonspecific LGE that might be associated with idiopathic NICM. Consider sarcoid    Echo (12/28/20): EF 50-55%   Amio stopped 12/2020.   Cardiac monitor 07/2021: 16.5% PVC burden. He was unable to tolerate mexiletine and was started on amiodarone. Saw Dr. Johney Frame and PVC ablation to be considered if PVC burden did not improve with amiodarone. Amio stopped   Echo 10/2021 EF 45-50%, RV okay.  zio 10/2021: SR with < 1% PVC burden Echo 12/2021 EF 50-55% mild HK of mid to distal lateral/inferolateral walls G1DD    Saw EP ([name]) 01/2022 . No PVCs on ECG. Watchful waiting. If PVCs recur -> consider ablation    Echo 10/2022 EF 45-50%    CPX 02/2023:  Resting HR: 78  Standing HR: 80 Peak HR: 150   (101% age predicted max HR)  BP rest: 140/84 Standing BP: 144/80 BP peak: 196/74   Peak VO2: 21.1 (79% predicted peak VO2)  VE/VCO2 slope:  33  OUES: 1.80  Peak RER: 1.11  Ventilatory Threshold: 17.1 (64% predicted or measured peak VO2)  Peak RR 29  Peak Ventilation:  65.6  VE/MVV:  75%  PETCO2 at peak:  34  O2pulse:  11   (73% predicted O2pulse)    Mild HF limitation and mild restriction   Patient called 06/08/2023 with BP spikes in the morning. Wife was present and assisted with the history. Had recently been started on PRN clonidine 0.1 mg BID by PCP in February. BP had been spiking at night into the morning hours. Tended to average 150s systolic in AM, improves to 120s-130s by afternoon. He had been walking 1-2.5 miles most days. No chest pain, shortness of breath, orthopnea, PND or lower extremity edema. He had cut back his sodium intake over the previous few weeks.  Today he returns to HF clinic for pharmacist medication titration. At last visit with MD Sherryll Burger was increased to 97/103 mg BID and clonidine was decreased to 0.1 mg daily. Plan to titrate off clonidine and optimize GDMT.   Overall feeling ***. Dizziness, lightheadedness, fatigue:  Chest pain or palpitations:  How is your breathing?: *** SOB: Able to complete all ADLs. Activity level ***  Weight  at home pounds. Takes furosemide/torsemide/bumex *** mg *** daily.  LEE PND/Orthopnea  Appetite *** Low-salt diet:   Physical Exam Cost/affordability of meds   Shortness of breath/dyspnea on exertion? {YES NO:22349}  Orthopnea/PND? {YES NO:22349} Edema? {YES NO:22349} Lightheadedness/dizziness? {YES NO:22349} Daily weights at home? {YES NO:22349} Blood pressure/heart rate monitoring at home? {YES [id number] Following low-sodium/fluid-restricted diet? {YES NO:22349}  HF Medications: Carvedilol 6.25 mg BID Entresto 97/103 mg BID Spironolactone 25 mg daily Farxiga 10 mg  daily Torsemide 20 mg prn  Has the patient been experiencing any side effects to the medications prescribed?  {YES NO:22349}  Does the patient have any problems obtaining medications due to transportation or finances?   {YES NO:22349}  Understanding of regimen: {excellent/good/fair/poor:19665} Understanding of indications: {excellent/good/fair/poor:19665} Potential of compliance: {excellent/good/fair/poor:19665} Patient understands to avoid NSAIDs. Patient understands to avoid decongestants.    Pertinent Lab Values: Serum creatinine ***, BUN ***, Potassium ***, Sodium ***, BNP ***, Magnesium ***, Digoxin ***   Vital Signs: Weight: *** (last clinic weight: 183 lbs) Blood pressure: ***  Heart rate: ***   Assessment/Plan: 1. Chronic Systolic Heart Failure (NICM) -Echo @ [facility] 2/21 EF of 25 to 30% with severe global hypokinesis.  RV also noted to be moderately enlarged with moderately impaired RV systolic function. -LHC 05/26/19 w/ mild, nonobstructive CAD. RHC w/ compensated hemodynamics  - cMRI 2/21 EF 18% w/ nonspecific inferoseptal RV insertion site LGE. ECV percentage elevated at 39%, can see w/ increased fibrosis with cardiomyopathy or myocarditis. Suspect PVC also contributing. PVCs now suppressed with amio.  - Echo 08/2019 EF 40-45% (improved with PVC suppression) - Echo 04/2020 EF 35-40% RV mildly HK. 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 Now that myocarditis improved PVCs have resolved. Doubt sarcoid as EF unlikely to get better without steroid treatment - NYHA II. Volume looks good. Continue Torsemide as needed. - Continue carvedilol 6.25 mg bid, - Increase Entresto to  97/103 d/t elevated BP (see discussion below). BMET/BNP in 2 weeks - Continue spironolactone 25 mg daily.  - Continue Farxiga 10 mg daily - Repeat echo at f/u in 4 months   2. PVCs - Zio 2/21 burden ~8-10% - Did not tolerate mexilitene. PVCs suppressed with amio but EF did not normalized. Amio now off (stopped 9/22) - Sleep study negative - Zio 2/22 Rare PVCs (<1%) - Zio 04/23: 16.5% PVC burden - Unable to tolerate mexiletine. Amio restarted then stopped in 10/2021 TSH normal 10/2021.  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    3. Hypertension - BP has been elevated with spikes especially in evening. - Recently started on clonidine 0.1 mg BID. Discussed that clonidine can be associated with rebound hypertension. Will titrate his other medications and wean clonidine off over the next few weeks. - Increase Entresto as above and drop clonidine to 0.1 mg daily - Continue with regular exercise. Reinforced limiting sodium intake. - F/u 4 weeks with PharmD. Plan to stop clonidine at that time. Can then increase coreg or add back amlodipine if needed.   4. Hyperlipidemia - Continue statin - PCP following   5. Type 2  Diabetes Mellitus - Continue Metformin & Farxiga  - Followed by PCP.   - Basic disease state pathophysiology, medication indication, mechanism and side effects reviewed at length with patient and he verbalized understanding  Follow up ***  Jarrett Ables, PharmD PGY-1 Pharmacy Resident  Karle Plumber, PharmD, BCPS, BCCP, CPP Heart Failure Clinic Pharmacist [phone]

## 2023-07-15 ENCOUNTER — Other Ambulatory Visit (HOSPITAL_COMMUNITY): Payer: Self-pay | Admitting: Internal Medicine

## 2023-07-15 ENCOUNTER — Ambulatory Visit (HOSPITAL_COMMUNITY)
Admission: RE | Admit: 2023-07-15 | Discharge: 2023-07-15 | Disposition: A | Discharge status: Home / Self Care | Source: Ambulatory Visit | Attending: Cardiology | Admitting: Cardiology

## 2023-07-15 DIAGNOSIS — Z7984 Long term (current) use of oral hypoglycemic drugs: Secondary | ICD-10-CM | POA: Insufficient documentation

## 2023-07-15 DIAGNOSIS — I110 Hypertensive heart disease with heart failure: Secondary | ICD-10-CM | POA: Diagnosis not present

## 2023-07-15 DIAGNOSIS — I493 Ventricular premature depolarization: Secondary | ICD-10-CM | POA: Diagnosis not present

## 2023-07-15 DIAGNOSIS — Z79899 Other long term (current) drug therapy: Secondary | ICD-10-CM | POA: Insufficient documentation

## 2023-07-15 DIAGNOSIS — E785 Hyperlipidemia, unspecified: Secondary | ICD-10-CM | POA: Insufficient documentation

## 2023-07-15 DIAGNOSIS — J449 Chronic obstructive pulmonary disease, unspecified: Secondary | ICD-10-CM | POA: Diagnosis not present

## 2023-07-15 DIAGNOSIS — E119 Type 2 diabetes mellitus without complications: Secondary | ICD-10-CM | POA: Insufficient documentation

## 2023-07-15 DIAGNOSIS — Z87891 Personal history of nicotine dependence: Secondary | ICD-10-CM | POA: Diagnosis not present

## 2023-07-15 DIAGNOSIS — I428 Other cardiomyopathies: Secondary | ICD-10-CM | POA: Diagnosis not present

## 2023-07-15 DIAGNOSIS — I5022 Chronic systolic (congestive) heart failure: Secondary | ICD-10-CM | POA: Diagnosis not present

## 2023-07-15 MED ORDER — CARVEDILOL 12.5 MG PO TABS
12.5000 mg | ORAL_TABLET | Freq: Two times a day (BID) | ORAL | 3 refills | Status: AC
Start: 2023-07-15 — End: 2024-07-14

## 2023-07-15 NOTE — Patient Instructions (Addendum)
 It was a pleasure seeing you today!  MEDICATIONS: -We are changing your medications today -Increase carvedilol to 12.5 mg (1 tablet) twice daily. - Stop taking clonidine - Please monitor your blood pressure daily at home. If you notice consistently elevated readings with stopping clonidine and increasing carvedilol, please reach out the clinic and let us know. -Call if you have questions about your medications.   NEXT APPOINTMENT: Return to clinic on July 7th, 2025 at 1:00 PM.  In general, to take care of your heart failure: -Limit your fluid intake to 2 Liters (half-gallon) per day.   -Limit your salt intake to ideally 2-3 grams (2000-3000 mg) per day. -Weigh yourself daily and record, and bring that "weight diary" to your next appointment.  (Weight gain of 2-3 pounds in 1 day typically means fluid weight.) -The medications for your heart are to help your heart and help you live longer.   -Please contact us before stopping any of your heart medications.  Call the clinic at [phone] with questions or to reschedule future appointments.

## 2023-07-15 NOTE — Progress Notes (Signed)
 Advanced Heart Failure Clinic Note   PCP:  Roy Proffer, MD     Cardiologist:  None Primary HF: Roy Duke  HPI:  Roy Duke is a 73 y.o. male with HTN, HL, DM2, former tobacco/ETOH use (quit 30 years ago), systolic HF due to NICM, memory impairment, COPD.   Admitted 03/2019 to [facility] with severe hyponatremia and HF.  Echo EF 25%. Developed NSVT with polymorphic ectopy. ECG with RBBB and new lateral TWI. He was transferred to [facility].   LHC w/ mild, nonobstructive CAD. Well compensated hemodynamics on RHC. cMRi EF 28% w/ nonspecific inferoseptal RV insertion site LGE. ECV percentage elevated at 39%. 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  He was started on amiodarone for suppression of PVCs.   -Sleep study 06/2019: AHI 2.3   -Echo 08/2019: EF 40-45% -Echo 04/2020 EF 35-40% RV mildly HK. Personally reviewed    cMRI 06/2020 - repeated due to persistent LV dysfunction despite PVC suppression 1. LVEF 40% ECV = 38%.Midwall LGE in the basal anterior and anteroseptal walls, 2. RVEF 43%  3. When compared to the prior study from 05/29/2019, there is decrease in size in both ventricles as well as improvement in biventricular systolic function. ECV is 38%. There are 2 segments of midwall nonspecific LGE that might be associated with idiopathic NICM. Consider sarcoid    Echo (12/28/2020): EF 50-55%   Amiodarone stopped 12/2020.   Cardiac monitor 07/2021: 16.5% PVC burden. He was unable to tolerate mexiletine and was started on amiodarone. Saw Roy Duke and PVC ablation to be considered if PVC burden did not improve with amiodarone. Amiodarone stopped   Echo 10/2021 EF 45-50%, RV okay.  zio 10/2021: SR with < 1% PVC burden Echo 12/2021 EF 50-55% mild HK of mid to distal lateral/inferolateral walls G1DD    Saw EP (Roy Duke) 01/2022 . No PVCs on ECG. Watchful waiting. If PVCs recur -> consider ablation    Echo 10/2022 EF 45-50%    CPX 02/2023:  Resting HR: 78  Standing HR: 80 Peak HR: 150   (101% age predicted max HR)  BP rest: 140/84 Standing BP: 144/80 BP peak: 196/74   Peak VO2: 21.1 (79% predicted peak VO2)  VE/VCO2 slope:  33  OUES: 1.80  Peak RER: 1.11  Ventilatory Threshold: 17.1 (64% predicted or measured peak VO2)  Peak RR 29  Peak Ventilation:  65.6  VE/MVV:  75%  PETCO2 at peak:  34  O2pulse:  11   (73% predicted O2pulse)    Mild HF limitation and mild restriction   Patient called 06/08/2023 with BP spikes in the morning. Wife was present and assisted with the history. Had recently been started on PRN clonidine 0.1 mg BID by PCP in February. BP had been spiking at night into the morning hours. Tended to average 150s systolic in AM, improves to 120s-130s by afternoon. He had been walking 1-2.5 miles most days. No chest pain, shortness of breath, orthopnea, PND or lower extremity edema. He had cut back his sodium intake over the previous few weeks.  Today he returns to HF clinic for pharmacist medication titration. At last visit with APP, Entresto was increased to 97/103 mg BID and clonidine was decreased to 0.1 mg daily. Plan was to titrate off clonidine and optimize GDMT. Overall feeling okay since last visit. He is tolerating increased dose of Entresto well. Patient reports BP has varied at home. When his pressure is higher he feels worse - endorses  headaches and grogginess. He monitors his BP daily at home sometimes multiple times per day. Generally in the morning BP is higher 140-150s/80-90s. Recalls readings 150/84, 144/91. Reports a few times BP has been as high as 166/100s. When he checks in the afternoon his BP is lower with readings usually 117-120/70-80s. Reports some dizziness when bending over and standing up. Denies chest pain and palpitations. He walks 2.5-3 miles daily without needing to stop to rest. Occasional SOB with activity, but says "nothing alarming". Does not monitor his weight at home and has not taken any doses of  torsemide since last visit. Denies LEE, PND, and orthopnea. Sleeps directly on 1 pillow at night. Reports a good appetite. Does not monitor his salt intake, but pt's wife says she adds very minimal salt to their food. Reports adherence to all medications as prescribed. Takes clonidine 0.1 mg daily before bedtime.  HF Medications: Carvedilol 6.25 mg BID Entresto 97/103 mg BID Spironolactone 25 mg daily Farxiga 10 mg daily Torsemide 20 mg prn  Has the patient been experiencing any side effects to the medications prescribed?  No  Does the patient have any problems obtaining medications due to transportation or finances?   No; has [employer]. Approved for Farxiga assistance through [employer] PAP thorough 04/06/2024.  Understanding of regimen: good Understanding of indications: good Potential of compliance: good Patient understands to avoid NSAIDs. Patient understands to avoid decongestants.    Pertinent Lab Values: Serum creatinine 1.17, BUN 13, Potassium 4.8, Sodium 138, BNP 46.0  Vital Signs: Weight: 182.2 lbs (last clinic weight: 183 lbs) Blood pressure: 150/102, 152/98  Heart rate: 71   Assessment/Plan: 1. Chronic Systolic Heart Failure (NICM) -Echo @ [facility] 05/2019 EF of 25 to 30% with severe global hypokinesis.  RV also noted to be moderately enlarged with moderately impaired RV systolic function. -LHC 05/26/19 w/ mild, nonobstructive CAD. RHC w/ compensated hemodynamics  - cMRI 05/2019 EF 18% w/ nonspecific inferoseptal RV insertion site LGE. ECV percentage elevated at 39%, can see w/ increased fibrosis with cardiomyopathy or myocarditis. Suspect PVC also contributing. PVCs were suppressed with amiodarone.  - Echo 08/2019 EF 40-45% (improved with PVC suppression) - Echo 04/2020 EF 35-40% RV mildly HK. 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 Now that myocarditis improved PVCs have resolved. Doubt sarcoid as EF unlikely to get better without steroid treatment - NYHA II. Volume looks good. Continue Torsemide as needed. Encouraged adherence to low salt diet and counseled to monitor weights at home. - Increase carvedilol to 12.5 mg BID - Continue Entresto 97/103 mg BID - Continue spironolactone 25 mg daily.  - Continue Farxiga 10 mg daily - Repeat echo scheduled for 10/12/23   2. PVCs - Zio 05/2019 burden ~8-10% - Did not tolerate mexilitene. PVCs suppressed with amiodarone but EF did not normalized. Amiodarone now off (stopped 12/2020) - Sleep study negative - Zio 05/2020 Rare PVCs (<1%) - Zio 07/2021: 16.5% PVC burden - Unable to tolerate mexiletine. Amiodarone restarted then stopped in 10/2021 TSH normal 10/2021.  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    3. Hypertension -  BP has been elevated with spikes especially in the morning likely due to rebound hypertension with dosing clonidine once daily before bed. Home afternoon BP readings are within goal. - Discontinue clonidine today and increase carvedilol to 12.5 mg BID as above. - Continue with regular exercise. Reinforced limiting sodium intake. - Can further increase carvedilol or add amlodipine in the future if needed for more BP control. - Counseled patient to reach out to clinic if BP remains consistently elevated despite these changes.   4. Hyperlipidemia - Continue statin - PCP following   5. Type 2 Diabetes Mellitus - Continue Metformin & Farxiga  - Followed by PCP.  Follow up APP clinic and echo on 10/12/2023.   Jarrett Ables, PharmD PGY-1 Pharmacy Resident  Karle Plumber, PharmD, BCPS, BCCP, CPP Heart Failure Clinic Pharmacist [phone]

## 2023-10-08 ENCOUNTER — Telehealth (HOSPITAL_COMMUNITY): Payer: Self-pay

## 2023-10-08 NOTE — Telephone Encounter (Signed)
 Called to confirm/remind patient of their appointment at the Advanced Heart Failure Clinic on 10/12/23.   Appointment:   [] Confirmed  [x] Left mess   [] No answer/No voice mail  [] VM Full/unable to leave message  [] Phone not in service  And to bring in all medications and/or complete list.

## 2023-10-12 ENCOUNTER — Ambulatory Visit (HOSPITAL_COMMUNITY)
Admission: RE | Admit: 2023-10-12 | Discharge: 2023-10-12 | Disposition: A | Discharge status: Home / Self Care | Source: Ambulatory Visit | Attending: Family Medicine | Admitting: Family Medicine

## 2023-10-12 ENCOUNTER — Ambulatory Visit (HOSPITAL_COMMUNITY): Payer: Self-pay | Admitting: Family Medicine

## 2023-10-12 ENCOUNTER — Encounter (HOSPITAL_COMMUNITY): Payer: Self-pay

## 2023-10-12 ENCOUNTER — Ambulatory Visit (HOSPITAL_BASED_OUTPATIENT_CLINIC_OR_DEPARTMENT_OTHER)
Admission: RE | Admit: 2023-10-12 | Discharge: 2023-10-12 | Disposition: A | Discharge status: Home / Self Care | Source: Ambulatory Visit | Attending: Family Medicine | Admitting: Family Medicine

## 2023-10-12 VITALS — BP 126/78 | HR 68 | Wt 181.8 lb

## 2023-10-12 DIAGNOSIS — Z7984 Long term (current) use of oral hypoglycemic drugs: Secondary | ICD-10-CM | POA: Diagnosis not present

## 2023-10-12 DIAGNOSIS — I428 Other cardiomyopathies: Secondary | ICD-10-CM | POA: Diagnosis not present

## 2023-10-12 DIAGNOSIS — I110 Hypertensive heart disease with heart failure: Secondary | ICD-10-CM | POA: Diagnosis not present

## 2023-10-12 DIAGNOSIS — I358 Other nonrheumatic aortic valve disorders: Secondary | ICD-10-CM | POA: Insufficient documentation

## 2023-10-12 DIAGNOSIS — I10 Essential (primary) hypertension: Secondary | ICD-10-CM | POA: Diagnosis not present

## 2023-10-12 DIAGNOSIS — I5022 Chronic systolic (congestive) heart failure: Secondary | ICD-10-CM | POA: Insufficient documentation

## 2023-10-12 DIAGNOSIS — Z79899 Other long term (current) drug therapy: Secondary | ICD-10-CM | POA: Insufficient documentation

## 2023-10-12 DIAGNOSIS — J4489 Other specified chronic obstructive pulmonary disease: Secondary | ICD-10-CM | POA: Diagnosis not present

## 2023-10-12 DIAGNOSIS — E119 Type 2 diabetes mellitus without complications: Secondary | ICD-10-CM | POA: Diagnosis not present

## 2023-10-12 DIAGNOSIS — I4510 Unspecified right bundle-branch block: Secondary | ICD-10-CM | POA: Insufficient documentation

## 2023-10-12 DIAGNOSIS — I493 Ventricular premature depolarization: Secondary | ICD-10-CM

## 2023-10-12 DIAGNOSIS — I2510 Atherosclerotic heart disease of native coronary artery without angina pectoris: Secondary | ICD-10-CM | POA: Diagnosis not present

## 2023-10-12 DIAGNOSIS — Z87891 Personal history of nicotine dependence: Secondary | ICD-10-CM | POA: Diagnosis not present

## 2023-10-12 DIAGNOSIS — E785 Hyperlipidemia, unspecified: Secondary | ICD-10-CM | POA: Insufficient documentation

## 2023-10-12 LAB — ECHOCARDIOGRAM COMPLETE
AV Mean grad: 4.7 mmHg
AV Peak grad: 8.5 mmHg
Ao pk vel: 1.46 m/s
Area-P 1/2: 3.2 cm2
Calc EF: 47.8 %
S' Lateral: 4 cm
Single Plane A2C EF: 47 %
Single Plane A4C EF: 45.5 %

## 2023-10-12 LAB — BASIC METABOLIC PANEL WITH GFR
Anion gap: 8 (ref 5–15)
BUN: 15 mg/dL (ref 8–23)
CO2: 25 mmol/L (ref 22–32)
Calcium: 8.8 mg/dL — ABNORMAL LOW (ref 8.9–10.3)
Chloride: 104 mmol/L (ref 98–111)
Creatinine, Ser: 1.14 mg/dL (ref 0.61–1.24)
GFR, Estimated: >60 mL/min (ref >60)
Glucose, Bld: 183 mg/dL — ABNORMAL HIGH (ref 70–99)
Potassium: 4.2 mmol/L (ref 3.5–5.1)
Sodium: 137 mmol/L (ref 135–145)

## 2023-10-12 LAB — BRAIN NATRIURETIC PEPTIDE: B Natriuretic Peptide: 33.9 pg/mL (ref 0.0–100.0)

## 2023-10-12 NOTE — Patient Instructions (Signed)
 No change in medications. Labs today - will call you if abnormal. Return to see Dr. Cherrie in 6 months. CALL [phone] IN DECEMBER TO SCHEDULE THIS APPOINTMENT. Please call us  at [phone] if any questions or concerns prior to your next visit.    [facility] [phone]

## 2023-10-12 NOTE — Progress Notes (Signed)
 Advanced Heart Failiure Clinic Note  PCP:  Pia Kerney SQUIBB, MD  HF Cardiologist:  Dr. Cherrie   HPI: Roy Duke is a 73 y.o. male with HTN, HL, DM2, former tobacco/ETOH use (quit 30 years ago), systolic HF due to NICM, memory impairment, COPD  Admitted 12/20 to [facility] with severe hyponatremia and HF.  Echo EF 25%. Developed NSVT with polymorphic ectopy. ECG with RBBB and new lateral TWI. He was transferred to [facility].   LHC w/ mild, nonobstructive CAD. Well compensated hemodynamics on RHC. cMRi EF 28% w/ nonspecific inferoseptal RV insertion site LGE. ECV percentage elevated at 39%. 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  He was started on amiodarone  for suppression of PVCs.   Sleep study 3/21 AHI 2.3 ]  Echo 5/21: EF 40-45%  Echo 1/22 EF 35-40% RV mildly HK.   cMRI 3/22 - repeated due to persistent LV dysfunction despite PVC suppression 1. LVEF 40% ECV = 38%.Midwall LGE in the basal anterior and anteroseptal walls, 2. RVEF 43%  3. When compared to the prior study from 05/29/2019, there is decrease in size in both ventricles as well as improvement in biventricular systolic function. ECV is 38%. There are 2 segments of midwall nonspecific LGE that might be associated with idiopathic NICM. Consider sarcoid   Echo (12/28/20): EF 50-55%  Amio stopped 09/22.  Cardiac monitor 04/23: 16.5% PVC burden. He was unable to tolerate mexiletine and was started on amiodarone . Saw Dr. Kelsie and PVC ablation to be considered if PVC burden did not improve with amiodarone . Amio stopped  Echo 7/23 EF 45-50%, RV okay.  Zio 07/23: SR with < 1% PVC burden  Echo 12/26/21 EF 50-55% mild HK of mid to distal lateral/inferolateral walls G1DD   Saw EP ([name]) 10/23 . No PVCs on ECG. Watchful waiting. If PVCs recur -> consider ablation   Echo 7/24 EF 45-50%   CPX 11/24:  Resting HR: 78 Standing HR: 80 Peak HR: 150   (101% age predicted max HR)  BP rest: 140/84 Standing  BP: 144/80 BP peak: 196/74   Peak VO2: 21.1 (79% predicted peak VO2)  VE/VCO2 slope:  33  OUES: 1.80  Peak RER: 1.11  Ventilatory Threshold: 17.1 (64% predicted or measured peak VO2)  Peak RR 29  Peak Ventilation:  65.6  VE/MVV:  75%  PETCO2 at peak:  34  O2pulse:  11   (73% predicted O2pulse)   Mild HF limitation and mild restriction  Today he returns for HF follow up. Overall feeling fine. Wife assists with history. He is not SOB with activity, rides bike on [address] in [facility], has SOB if he pushes too hard. Denies palpitations, abnormal bleeding, CP, dizziness, edema, or PND/Orthopnea. Appetite ok. Weight at home 176 pounds. Taking all medications. Had rebound HTN with clonidine , now off. Has 4 rescue dachshunds. No ETOH or tobacco use.  Echo today EF 45-50%, RV ok  Past Medical History:  Diagnosis Date   Anxiety    Asthma    CHF (congestive heart failure) (HCC)    Diabetes mellitus without complication (HCC)    GERD (gastroesophageal reflux disease)    Hyperlipidemia    Hypertension    NSVT (nonsustained ventricular tachycardia) (HCC)    PVC's (premature ventricular contractions)    Substance abuse (HCC)    Past Surgical History:  Procedure Laterality Date   INGUINAL HERNIA REPAIR Bilateral    RIGHT/LEFT HEART CATH AND CORONARY ANGIOGRAPHY N/A 05/26/2019  Procedure: RIGHT/LEFT HEART CATH AND CORONARY ANGIOGRAPHY;  Surgeon: Cherrie Toribio SAUNDERS, MD;  Location: [facility] INVASIVE CV LAB;  Service: Cardiovascular;  Laterality: N/A;   Current Outpatient Medications  Medication Sig Dispense Refill   aspirin  EC 81 MG EC tablet Take 1 tablet (81 mg total) by mouth daily. 90 tablet 0   atorvastatin  (LIPITOR) 40 MG tablet Take 40 mg by mouth daily at 6 PM.      carvedilol  (COREG ) 12.5 MG tablet Take 1 tablet (12.5 mg total) by mouth 2 (two) times daily. 180 tablet 3   cholecalciferol  (VITAMIN D3) 25 MCG (1000 UNIT) tablet Take 1,000 Units by mouth daily.     dapagliflozin   propanediol (FARXIGA ) 10 MG TABS tablet Take 1 tablet (10 mg total) by mouth daily before breakfast. 90 tablet 3   Dupilumab (DUPIXENT [place]) Inject 1 Dose into the skin every 14 (fourteen) days.     escitalopram (LEXAPRO) 10 MG tablet Take 10 mg by mouth daily.     LIDOCAINE -MENTHOL, SPRAY, EX Apply 1 spray topically as needed.     metFORMIN  (GLUCOPHAGE ) 500 MG tablet Take 500 mg by mouth 2 (two) times daily with a meal.      omeprazole (PRILOSEC) 40 MG capsule Take 40 mg by mouth daily.     sacubitril -valsartan  (ENTRESTO ) 97-103 MG Take 1 tablet by mouth 2 (two) times daily. 60 tablet 11   spironolactone  (ALDACTONE ) 25 MG tablet TAKE 1 TABLET BY MOUTH AT BEDTIME 90 tablet 3   torsemide  (DEMADEX ) 20 MG tablet Take 20 mg by mouth as needed.     traMADol  (ULTRAM -ER) 100 MG 24 hr tablet Take 100 mg by mouth daily as needed for pain.     vitamin C (ASCORBIC ACID ) 500 MG tablet Take 500 mg by mouth daily.     No current facility-administered medications for this encounter.   Allergies:   Benadryl [diphenhydramine], Codeine, and Penicillins   Social History:  The patient  reports that he quit smoking about 34 years ago. His smoking use included cigarettes. He has never used smokeless tobacco. He reports that he does not currently use alcohol. He reports that he does not currently use drugs.   Family History:  The patient's family history includes CAD in his paternal uncle; Heart attack in his paternal grandfather; Heart disease in his father.   ROS:  Please see the history of present illness.   All other systems are personally reviewed and negative.   Wt Readings from Last 3 Encounters:  10/12/23 82.5 kg (181 lb 12.8 oz)  07/15/23 82.6 kg (182 lb 3.2 oz)  06/17/23 83.2 kg (183 lb 6.4 oz)   BP 126/78   Pulse 68   Wt 82.5 kg (181 lb 12.8 oz)   SpO2 97%   BMI 26.09 kg/m   Physical Exam:   General:  NAD. No resp difficulty, walked into clinic HEENT: Normal Neck: Supple. No JVD. Cor: Regular  rate & rhythm. No rubs, gallops or murmurs. Lungs: Clear Abdomen: Soft, nontender, nondistended.  Extremities: No cyanosis, clubbing, rash, edema Neuro: Alert & oriented x 3, moves all 4 extremities w/o difficulty. Affect pleasant.   ASSESSMENT AND PLAN: 1. Chronic Systolic Heart Failure (NICM) - Echo @ [place] 2/21 EF of 25 to 30% with severe global hypokinesis.  RV also noted to be moderately enlarged with moderately impaired RV systolic function. - LHC 05/26/19 w/ mild, nonobstructive CAD. RHC w/ compensated hemodynamics  - cMRI 2/21 EF 18% w/ nonspecific inferoseptal RV insertion site  LGE. ECV percentage elevated at 39%, can see w/ increased fibrosis with cardiomyopathy or myocarditis. Suspect PVC also contributing. PVCs now suppressed with amio.  - Echo 08/29/19 EF 40-45% (improved with PVC suppression) - Echo 04/24/20 EF 35-40% RV mildly HK. 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 Now that myocarditis improved PVCs have resolved. Doubt sarcoid as EF unlikely to get better without steroid treatment - Echo today 10/12/23, EF 45-50%, normal RV - NYHA I-II. Volume looks good.  - Continue torsemide  20 mg PRN - Continue carvedilol  12.5 mg bid. - Continue Entresto  97/103 mg bid. - Continue spironolactone  25 mg daily.  - Continue Farxiga  10 mg daily - Labs today.  2. PVCs - Zio 2/21 burden ~8-10% - Did not tolerate mexilitene. PVCs suppressed with amio but EF did not normalized. Amio now off (stopped 9/22) - Sleep study negative - Zio 2/22 Rare PVCs (<1%) - Zio 04/23: 16.5% PVC burden - Unable to tolerate mexiletine. Amio restarted then stopped in 7/23 TSH normal 07/23.  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   3. HTN - off clonidine  with rebound HTN - BP well controlled today - GDMT as above  4. HLD - Continue atorvastatin  40 mg daily. - PCP following   5. DM2 - Continue Metformin  & Farxiga   - Followed by PCP.  Doing well! Follow up in 6 months with Dr. Cherrie Roy CHRISTELLA Glena, FNP  10/12/2023 2:40 PM

## 2023-10-13 ENCOUNTER — Ambulatory Visit (HOSPITAL_COMMUNITY): Payer: Self-pay | Admitting: Physician Assistant

## 2023-11-03 ENCOUNTER — Encounter (HOSPITAL_BASED_OUTPATIENT_CLINIC_OR_DEPARTMENT_OTHER): Payer: Self-pay

## 2023-11-03 ENCOUNTER — Other Ambulatory Visit: Payer: Self-pay

## 2023-11-03 ENCOUNTER — Emergency Department (HOSPITAL_BASED_OUTPATIENT_CLINIC_OR_DEPARTMENT_OTHER): Admission: EM | Admit: 2023-11-03 | Discharge: 2023-11-03 | Disposition: A | Discharge status: Home / Self Care

## 2023-11-03 ENCOUNTER — Emergency Department (HOSPITAL_BASED_OUTPATIENT_CLINIC_OR_DEPARTMENT_OTHER)

## 2023-11-03 DIAGNOSIS — Z23 Encounter for immunization: Secondary | ICD-10-CM | POA: Insufficient documentation

## 2023-11-03 DIAGNOSIS — S61052A Open bite of left thumb without damage to nail, initial encounter: Secondary | ICD-10-CM | POA: Insufficient documentation

## 2023-11-03 DIAGNOSIS — Z79899 Other long term (current) drug therapy: Secondary | ICD-10-CM | POA: Diagnosis not present

## 2023-11-03 DIAGNOSIS — Z7982 Long term (current) use of aspirin: Secondary | ICD-10-CM | POA: Insufficient documentation

## 2023-11-03 DIAGNOSIS — L089 Local infection of the skin and subcutaneous tissue, unspecified: Secondary | ICD-10-CM

## 2023-11-03 DIAGNOSIS — L02512 Cutaneous abscess of left hand: Secondary | ICD-10-CM | POA: Diagnosis not present

## 2023-11-03 DIAGNOSIS — W540XXA Bitten by dog, initial encounter: Secondary | ICD-10-CM | POA: Diagnosis not present

## 2023-11-03 DIAGNOSIS — S6992XA Unspecified injury of left wrist, hand and finger(s), initial encounter: Secondary | ICD-10-CM | POA: Diagnosis present

## 2023-11-03 MED ORDER — TETANUS-DIPHTH-ACELL PERTUSSIS 5-2.5-18.5 LF-MCG/0.5 IM SUSY
0.5000 mL | PREFILLED_SYRINGE | Freq: Once | INTRAMUSCULAR | Status: AC
  Administered 2023-11-03: 0.5 mL via INTRAMUSCULAR
  Filled 2023-11-03: qty 0.5

## 2023-11-03 MED ORDER — LIDOCAINE HCL (PF) 1 % IJ SOLN
10.0000 mL | Freq: Once | INTRAMUSCULAR | Status: AC
  Administered 2023-11-03: 10 mL
  Filled 2023-11-03: qty 10

## 2023-11-03 MED ORDER — SULFAMETHOXAZOLE-TRIMETHOPRIM 800-160 MG PO TABS
1.0000 | ORAL_TABLET | Freq: Two times a day (BID) | ORAL | 0 refills | Status: AC
Start: 2023-11-03 — End: 2023-11-13

## 2023-11-03 MED ORDER — METRONIDAZOLE 500 MG PO TABS: 500.0000 mg | ORAL_TABLET | Freq: Three times a day (TID) | ORAL | 0 refills | Status: AC

## 2023-11-03 MED ORDER — BACITRACIN ZINC 500 UNIT/GM EX OINT: 1.0000 | TOPICAL_OINTMENT | Freq: Two times a day (BID) | CUTANEOUS | 0 refills | Status: AC

## 2023-11-03 NOTE — Discharge Instructions (Addendum)
 You can call any of these offices.  They will get you into see them.  You need to follow-up with them in the next week at least.  If you have any, worsening swelling or redness to the area or tracking of the redness further into the base of your thumb then please come back to the ED for further evaluation.  Take the antibiotics as prescribed.  Please place the bacitracin  as prescribed.  Keep the wound covered and clean.   Please soak the wound in iodine and saline 3-4 times daily for approximate 15 minutes at a time until it completely heals.

## 2023-11-03 NOTE — ED Provider Notes (Signed)
 [place] [facility] Provider Note   CSN: [id number] Arrival date & time: 11/03/23  1102     Patient presents with: Animal Bite   Roy Duke is a 73 y.o. male.   HPI    Presents because of dog bite.  Left thumb.  Bit yesterday.  Was bit by one of his own dogs whenever try to separate his 2 dogs while feeding.  Accidental.  Patient states that he has records of dog being up-to-date on rabies vaccination series.  Patient states that he is not sure about his last Tdap but he thinks it has been over 10 years.  Endorses some redness along the thumb at this point in time.  Currently not taking any antibiotics.  States he is allergic to penicillins.    Prior to Admission medications   Medication Sig Start Date End Date Taking? Authorizing Provider  bacitracin  ointment Apply 1 Application topically 2 (two) times daily. 11/03/23  Yes Simon Lavonia SAILOR, MD  metroNIDAZOLE  (FLAGYL ) 500 MG tablet Take 1 tablet (500 mg total) by mouth 3 (three) times daily for 10 days. 11/03/23 11/13/23 Yes Simon Lavonia SAILOR, MD  sulfamethoxazole -trimethoprim  (BACTRIM  DS) 800-160 MG tablet Take 1 tablet by mouth 2 (two) times daily for 10 days. 11/03/23 11/13/23 Yes Simon Lavonia SAILOR, MD  aspirin  EC 81 MG EC tablet Take 1 tablet (81 mg total) by mouth daily. 05/30/19   Henry Manuelita NOVAK, NP  atorvastatin  (LIPITOR) 40 MG tablet Take 40 mg by mouth daily at 6 PM.  02/16/19   [provider]  carvedilol  (COREG ) 12.5 MG tablet Take 1 tablet (12.5 mg total) by mouth 2 (two) times daily. 07/15/23 07/14/24  Bensimhon, Toribio SAUNDERS, MD  cholecalciferol  (VITAMIN D3) 25 MCG (1000 UNIT) tablet Take 1,000 Units by mouth daily.    [provider]  dapagliflozin  propanediol (FARXIGA ) 10 MG TABS tablet Take 1 tablet (10 mg total) by mouth daily before breakfast. 03/10/23   Bensimhon, Toribio SAUNDERS, MD  Dupilumab (DUPIXENT [place]) Inject 1 Dose into the skin every 14 (fourteen) days.    [provider]  escitalopram (LEXAPRO) 10 MG tablet Take 10 mg by mouth daily.    [provider]  LIDOCAINE -MENTHOL, SPRAY, EX Apply 1 spray topically as needed.    [provider]  metFORMIN  (GLUCOPHAGE ) 500 MG tablet Take 500 mg by mouth 2 (two) times daily with a meal.     [provider]  omeprazole (PRILOSEC) 40 MG capsule Take 40 mg by mouth daily. 01/13/19   [provider]  sacubitril -valsartan  (ENTRESTO ) 97-103 MG Take 1 tablet by mouth 2 (two) times daily. 06/17/23   Colletta Manuelita Garre, PA-C  spironolactone  (ALDACTONE ) 25 MG tablet TAKE 1 TABLET BY MOUTH AT BEDTIME 02/10/23   Bensimhon, Toribio SAUNDERS, MD  torsemide  (DEMADEX ) 20 MG tablet Take 20 mg by mouth as needed.    [provider]  traMADol  (ULTRAM -ER) 100 MG 24 hr tablet Take 100 mg by mouth daily as needed for pain.    [provider]  vitamin C (ASCORBIC ACID ) 500 MG tablet Take 500 mg by mouth daily.    [provider]    Allergies: Benadryl [diphenhydramine], Codeine, and Penicillins    Review of Systems  Constitutional:  Negative for chills and fever.  HENT:  Negative for ear pain and sore throat.   Eyes:  Negative for pain and visual disturbance.  Respiratory:  Negative for cough and shortness of breath.  Cardiovascular:  Negative for chest pain and palpitations.  Gastrointestinal:  Negative for abdominal pain and vomiting.  Genitourinary:  Negative for dysuria and hematuria.  Musculoskeletal:  Negative for arthralgias and back pain.  Skin:  Negative for color change and rash.  Neurological:  Negative for seizures and syncope.  All other systems reviewed and are negative.   Updated Vital Signs BP (!) 144/93 (BP Location: Left Arm)   Pulse 71   Temp 98 F (36.7 C)   Resp 18   SpO2 95%   Physical Exam Vitals and nursing note reviewed.  Constitutional:      General: He is not in acute distress.    Appearance: He is well-developed.  HENT:      Head: Normocephalic and atraumatic.  Eyes:     Conjunctiva/sclera: Conjunctivae normal.  Cardiovascular:     Rate and Rhythm: Normal rate and regular rhythm.     Heart sounds: No murmur heard. Pulmonary:     Effort: Pulmonary effort is normal. No respiratory distress.     Breath sounds: Normal breath sounds.  Abdominal:     Palpations: Abdomen is soft.     Tenderness: There is no abdominal tenderness.  Musculoskeletal:        General: No swelling.     Cervical back: Neck supple.  Skin:    General: Skin is warm and dry.     Capillary Refill: Capillary refill takes less than 2 seconds.  Neurological:     Mental Status: He is alert.  Psychiatric:        Mood and Affect: Mood normal.       Media Information  Document Information  Photos    11/03/2023 11:19  Attached To:  Hospital Encounter on 11/03/23  Source Information  Simon Lavonia SAILOR, MD  [facility]     (all labs ordered are listed, but only abnormal results are displayed) Labs Reviewed - No data to display  EKG: None  Radiology: DG Finger Thumb Right Result Date: 11/03/2023 CLINICAL DATA:  Dog bite. EXAM: RIGHT THUMB 2+V COMPARISON:  None Available. FINDINGS: There is no acute fracture or dislocation. There is arthritic changes of the interphalangeal joint of the thumb with moderate spurring. Mild soft tissue swelling. No radiopaque foreign object or soft tissue gas. IMPRESSION: 1. No acute fracture or dislocation. 2. 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 Electronically Signed   By: Vanetta Chou M.D.   On: 11/03/2023 13:05     .Incision and Drainage  Date/Time: 11/03/2023 1:26 PM  Performed by: Simon Lavonia SAILOR, MD Authorized by: Simon Lavonia SAILOR, MD   Consent:    Consent obtained:  Verbal   Consent given by:  Patient   Risks discussed:  Bleeding, incomplete drainage, pain, infection and damage to other organs   Alternatives discussed:  Alternative treatment Universal  protocol:    Patient identity confirmed:  Verbally with patient Location:    Type:  Abscess   Location:  Upper extremity Pre-procedure details:    Skin preparation:  Antiseptic wash Sedation:    Sedation type:  None Anesthesia:    Anesthesia method:  Local infiltration   Local anesthetic:  Lidocaine  1% w/o epi Procedure type:    Complexity:  Complex Procedure details:    Incision types:  Single straight   Drainage:  Purulent and bloody   Drainage amount:  Scant   Wound treatment:  Wound left open   Packing materials:  None .Nerve Block  Date/Time: 11/03/2023  1:28 PM  Performed by: Simon Lavonia SAILOR, MD Authorized by: Simon Lavonia SAILOR, MD   Consent:    Consent obtained:  Verbal   Consent given by:  Patient   Risks, benefits, and alternatives were discussed: yes     Risks discussed:  Allergic reaction, bleeding, intravenous injection, infection, nerve damage, pain, unsuccessful block and swelling   Alternatives discussed:  Alternative treatment Universal protocol:    Patient identity confirmed:  Verbally with patient Indications:    Indications:  Pain relief and procedural anesthesia Location:    Body area:  Upper extremity   Laterality:  Left Pre-procedure details:    Skin preparation:  Chlorhexidine    Preparation: Patient was prepped and draped in usual sterile fashion   Skin anesthesia:    Skin anesthesia method:  None Procedure details:    Block needle gauge:  30 G   Anesthetic injected:  Lidocaine  1% w/o epi   Injection procedure:  Anatomic landmarks identified   Paresthesia:  Immediately resolved Post-procedure details:    Dressing:  None   Outcome:  Anesthesia achieved   Procedure completion:  Tolerated    Medications Ordered in the ED  lidocaine  (PF) (XYLOCAINE ) 1 % injection 10 mL (10 mLs Other Given by Other 11/03/23 1132)  Tdap (BOOSTRIX ) injection 0.5 mL (0.5 mLs Intramuscular Given 11/03/23 1133)                                    Medical Decision  Making Amount and/or Complexity of Data Reviewed Radiology: ordered.  Risk Prescription drug management.      Presents because of dog bite.  Left thumb.  Bit yesterday.  Was bit by one of his own dogs whenever try to separate his 2 dogs while feeding.  Accidental.  Patient states that he has records of dog being up-to-date on rabies vaccination series.  Patient states that he is not sure about his last Tdap but he thinks it has been over 10 years.  Endorses some redness along the thumb at this point in time.  Currently not taking any antibiotics.  States he is allergic to penicillins.    Patient noted to have puncture wound medial aspect of left thumb with a hematoma appreciated on the dorsum of the hand tracking towards the nail cuticle.  Also has some slight erythema along the pulp of the thumb.  Some slight pain to palpation of this area.  Ultimately consented the patient to extension of the bite wound as well as drainage of the hematoma so that we could open up the area to really clean it out.  Was able to drain a slight amount of purulent material along this area.  Washed the wound extensively with iodine as well as sterile saline with high-pressure.  Also had the patient soak the thumb for approximately 30 minutes with saline and iodine.  Unfortunately, patient has a history of allergy to penicillins.  Therefore, we will call the patient in antibiotics of Bactrim  as well as Flagyl .  Will cover with Bactrim  in setting of slight purulent drainage to cover for MRSA.  Not enough drainage for sample to be sent to the lab.  Most of it was bloody drainage.  Also order Flagyl  for anaerobic coverage.  Tdap updated.   Plan will be for the patient to follow-up with hand surgery.  I will instruct him to soak his hand in iodine for approximately 15 to  20 minutes 3-4 times daily.  Will also call in bacitracin  for the wound area.  This will have to heal by secondary intention.        Final  diagnoses:  Dog bite, initial encounter  Dog bite of left thumb with infection, initial encounter    ED Discharge Orders          Ordered    bacitracin  ointment  2 times daily        11/03/23 1335    sulfamethoxazole -trimethoprim  (BACTRIM  DS) 800-160 MG tablet  2 times daily        11/03/23 1335    metroNIDAZOLE  (FLAGYL ) 500 MG tablet  3 times daily        11/03/23 1335               Simon Lavonia SAILOR, MD 11/03/23 1338

## 2023-11-03 NOTE — ED Triage Notes (Signed)
 Pt presents with complaints of a dog bite that occurred yesterday. Reports he was bit by his own dog on the left thumb. Redness & swelling noted.

## 2023-11-03 NOTE — ED Notes (Signed)
 Right thumb wound cleaned and dressed per EDP verbal order.

## 2023-11-04 ENCOUNTER — Telehealth: Payer: Self-pay | Admitting: Orthopedic Surgery

## 2023-11-04 NOTE — Telephone Encounter (Signed)
 Patient called and has been trying to get seen for a dog bite from the ER, he needs an appointment. CB#[phone]

## 2023-11-04 NOTE — Telephone Encounter (Signed)
 Scheduled to see him tomorrow at 7:45

## 2023-11-05 ENCOUNTER — Ambulatory Visit: Admitting: Orthopedic Surgery

## 2023-11-05 ENCOUNTER — Telehealth: Payer: Self-pay | Admitting: Orthopedic Surgery

## 2023-11-05 DIAGNOSIS — W540XXA Bitten by dog, initial encounter: Secondary | ICD-10-CM | POA: Diagnosis not present

## 2023-11-05 DIAGNOSIS — S61259A Open bite of unspecified finger without damage to nail, initial encounter: Secondary | ICD-10-CM

## 2023-11-05 NOTE — Telephone Encounter (Signed)
 Appt made for Wednesday at 10:30

## 2023-11-05 NOTE — Telephone Encounter (Signed)
 Patient needs an appointment for next Thursday for a f/u with Anshul. Can you put him on the schedule. CB#[phone]

## 2023-11-05 NOTE — Progress Notes (Signed)
 Roy Duke - 73 y.o. male MRN [id number]  Date of birth: September 16, 1950  Office Visit Note: Visit Date: 11/05/2023 PCP: Pia Kerney SQUIBB, MD Referred by: Pia Kerney SQUIBB, MD  Subjective: No chief complaint on file.  HPI: Roy Duke is a pleasant 73 y.o. male who presents today for evaluation of a right thumb dog bite injury sustained 3 days prior.  It was his own dog who is up-to-date on all vaccinations.  Was seen in the emergency department setting the day of injury, underwent bedside incision and drainage with notable removal of purulent material.  Since that time, has not had ongoing drainage, pain is controlled, has been taking his antibiotics as instructed.  He is also doing daily Betadine soaks.  Pertinent ROS were reviewed with the patient and found to be negative unless otherwise specified above in HPI.   Visit Reason: Dog bite right thumb- his dog, UTD on vaccines Duration of symptoms: 3 days  Hand dominance: right Occupation: retired Diabetic: Yes Smoking: No Heart/Lung History: CHF  Blood Thinners: Aspirin  every day  Prior Testing/EMG: x-rays  Injections (Date): none Treatments: antibiotics oral and topical  Soaks in betadine  Prior Surgery: none   Assessment & Plan: Visit Diagnoses:  1. Dog bite of finger, initial encounter     Plan: He is doing well overall is demonstrating appropriate healing without residual infection at the distal aspect of the thumb.  There is no expressible drainage on examination today.  He stated that there was tracking erythema up and down the thumb initially which has resolved which is good to hear.  I recommended that he continue with Betadine soaks as instructed daily and daily dressing changes with basic wound care.  Complete antibiotics as instructed.  I will plan on seeing him back next week for a wound check.  Follow-up: No follow-ups on file.   Meds & Orders: No orders of the defined types were placed in this  encounter.  No orders of the defined types were placed in this encounter.    Procedures: No procedures performed      Clinical History: No specialty comments available.  He reports that he quit smoking about 34 years ago. His smoking use included cigarettes. He has never used smokeless tobacco. No results for input(s): HGBA1C, LABURIC in the last 8760 hours.  Objective:   Vital Signs: There were no vitals taken for this visit.  Physical Exam  Gen: Well-appearing, in no acute distress; non-toxic CV: Regular Rate. Well-perfused. Warm.  Resp: Breathing unlabored on room air; no wheezing. Psych: Fluid speech in conversation; appropriate affect; normal thought process  Ortho Exam Right thumb: - Moderate swelling over the distal pulp, no expressible drainage, mild erythema at the bite site over the ulnar border distal to the IP joint - IP range of motion is full flexion/extension 0/70 without restriction - Sensation is intact distally to the tip, normal color and capillary refill  Imaging: No results found.  Past Medical/Family/Surgical/Social History: Medications & Allergies reviewed per EMR, new medications updated. 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   Upper abdominal pain 05/26/2019   Anxiety 05/26/2019   COPD not affecting current episode of care 05/26/2019   Diabetes mellitus (HCC) 05/26/2019   Essential hypertension 05/26/2019   Hypercholesterolemia 05/26/2019   Muscle spasm 05/26/2019   Pleural effusion 05/26/2019   SOB (shortness of breath) 05/26/2019   Congestive heart failure (  HCC) 05/26/2019   Hypertensive heart disease with acute systolic congestive heart failure (HCC) 05/26/2019   Systolic heart failure (HCC) 05/26/2019   Acute systolic CHF (congestive heart failure) (HCC) 05/25/2019   Acute hyponatremia    Encounter for central line placement    Hypernatremia 03/22/2019   Past Medical  History:  Diagnosis Date   Anxiety    Asthma    CHF (congestive heart failure) (HCC)    Diabetes mellitus without complication (HCC)    GERD (gastroesophageal reflux disease)    Hyperlipidemia    Hypertension    NSVT (nonsustained ventricular tachycardia) (HCC)    PVC's (premature ventricular contractions)    Substance abuse (HCC)    Family History  Problem Relation Age of Onset   Heart disease Father        cardiac arrest in his 8's - surviced this and had PPM placed.    CAD Paternal Uncle        CABG   Heart attack Paternal Grandfather    Colon cancer Neg Hx    Esophageal cancer Neg Hx    Rectal cancer Neg Hx    Stomach cancer Neg Hx    Past Surgical History:  Procedure Laterality Date   INGUINAL HERNIA REPAIR Bilateral    RIGHT/LEFT HEART CATH AND CORONARY ANGIOGRAPHY N/A 05/26/2019   Procedure: RIGHT/LEFT HEART CATH AND CORONARY ANGIOGRAPHY;  Surgeon: Cherrie Toribio SAUNDERS, MD;  Location: [facility];  Service: Cardiovascular;  Laterality: N/A;   Social History   Occupational History   Not on file  Tobacco Use   Smoking status: Former    Current packs/day: 0.00    Types: Cigarettes    Quit date: 04/07/1989    Years since quitting: 34.6   Smokeless tobacco: Never  Vaping Use   Vaping status: Never Used  Substance and Sexual Activity   Alcohol use: Not Currently   Drug use: Not Currently   Sexual activity: Yes    Bear Osten Estela) Arlinda, M.D. [facility], Hand Surgery

## 2023-11-11 ENCOUNTER — Ambulatory Visit: Admitting: Orthopedic Surgery

## 2023-11-11 DIAGNOSIS — S61259D Open bite of unspecified finger without damage to nail, subsequent encounter: Secondary | ICD-10-CM | POA: Diagnosis not present

## 2023-11-11 DIAGNOSIS — W540XXD Bitten by dog, subsequent encounter: Secondary | ICD-10-CM | POA: Diagnosis not present

## 2023-11-11 NOTE — Progress Notes (Unsigned)
 Roy Duke - 73 y.o. male MRN [id number]  Date of birth: [birth year]  Office Visit Note: Visit Date: 11/11/2023 PCP: Pia Kerney SQUIBB, MD Referred by: Pia Kerney SQUIBB, MD  Subjective: No chief complaint on file.  HPI: Roy Duke is a pleasant 73 y.o. male who returns today for follow-up of a right thumb dog bite injury sustained 9 days prior.  It was his own dog who is up-to-date on all vaccinations.  Was seen in the emergency department setting the day of injury, underwent bedside incision and drainage with notable removal of purulent material.  Since that time, has not had ongoing drainage, pain is controlled.  Wound remains well-healing without issue.  Pertinent ROS were reviewed with the patient and found to be negative unless otherwise specified above in HPI.    Assessment & Plan: Visit Diagnoses:  1. Dog bite of finger, subsequent encounter      Plan: He continues to do well overall and is demonstrating appropriate healing without residual infection at the distal aspect of the thumb.  No need for surgical intervention at this time.  He is welcome to follow-up as needed.  Follow-up: No follow-ups on file.   Meds & Orders: No orders of the defined types were placed in this encounter.  No orders of the defined types were placed in this encounter.    Procedures: No procedures performed      Clinical History: No specialty comments available.  He reports that he quit smoking about 34 years ago. His smoking use included cigarettes. He has never used smokeless tobacco. No results for input(s): HGBA1C, LABURIC in the last 8760 hours.  Objective:   Vital Signs: There were no vitals taken for this visit.  Physical Exam  Gen: Well-appearing, in no acute distress; non-toxic CV: Regular Rate. Well-perfused. Warm.  Resp: Breathing unlabored on room air; no wheezing. Psych: Fluid speech in conversation; appropriate affect; normal thought process  Ortho  Exam Right thumb: - Minimal swelling over the distal pulp, no expressible drainage, no erythema at the bite site over the ulnar border distal to the IP joint - IP range of motion is full flexion/extension 0/70 without restriction - Sensation is intact distally to the tip, normal color and capillary refill  Imaging: No results found.  Past Medical/Family/Surgical/Social History: Medications & Allergies reviewed per EMR, new medications updated. 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   Upper abdominal pain 05/26/2019   Anxiety 05/26/2019   COPD not affecting current episode of care 05/26/2019   Diabetes mellitus (HCC) 05/26/2019   Essential hypertension 05/26/2019   Hypercholesterolemia 05/26/2019   Muscle spasm 05/26/2019   Pleural effusion 05/26/2019   SOB (shortness of breath) 05/26/2019   Congestive heart failure (HCC) 05/26/2019   Hypertensive heart disease with acute systolic congestive heart failure (HCC) 05/26/2019   Systolic heart failure (HCC) 05/26/2019   Acute systolic CHF (congestive heart failure) (HCC) 05/25/2019   Acute hyponatremia    Encounter for central line placement    Hypernatremia 03/22/2019   Past Medical History:  Diagnosis Date   Anxiety    Asthma    CHF (congestive heart failure) (HCC)    Diabetes mellitus without complication (HCC)    GERD (gastroesophageal reflux disease)    Hyperlipidemia    Hypertension    NSVT (nonsustained ventricular tachycardia) (HCC)    PVC's (premature ventricular contractions)    Substance abuse (HCC)  Family History  Problem Relation Age of Onset   Heart disease Father        cardiac arrest in his 34's - surviced this and had PPM placed.    CAD Paternal Uncle        CABG   Heart attack Paternal Grandfather    Colon cancer Neg Hx    Esophageal cancer Neg Hx    Rectal cancer Neg Hx    Stomach cancer Neg Hx    Past Surgical History:  Procedure  Laterality Date   INGUINAL HERNIA REPAIR Bilateral    RIGHT/LEFT HEART CATH AND CORONARY ANGIOGRAPHY N/A 05/26/2019   Procedure: RIGHT/LEFT HEART CATH AND CORONARY ANGIOGRAPHY;  Surgeon: Cherrie Toribio SAUNDERS, MD;  Location: [facility];  Service: Cardiovascular;  Laterality: N/A;   Social History   Occupational History   Not on file  Tobacco Use   Smoking status: Former    Current packs/day: 0.00    Types: Cigarettes    Quit date: 04/07/1989    Years since quitting: 34.6   Smokeless tobacco: Never  Vaping Use   Vaping status: Never Used  Substance and Sexual Activity   Alcohol use: Not Currently   Drug use: Not Currently   Sexual activity: Yes    Avrianna Smart Estela) Arlinda, M.D. [facility], Hand Surgery

## 2024-02-04 ENCOUNTER — Other Ambulatory Visit (HOSPITAL_COMMUNITY): Payer: Self-pay | Admitting: Internal Medicine

## 2024-02-08 ENCOUNTER — Encounter: Payer: Self-pay | Admitting: Radiology

## 2024-04-12 ENCOUNTER — Ambulatory Visit (HOSPITAL_COMMUNITY)
Admission: RE | Admit: 2024-04-12 | Discharge: 2024-04-12 | Disposition: A | Discharge status: Home / Self Care | Source: Ambulatory Visit | Attending: Internal Medicine | Admitting: Internal Medicine

## 2024-04-12 VITALS — BP 134/80 | HR 73 | Wt 179.6 lb

## 2024-04-12 DIAGNOSIS — I110 Hypertensive heart disease with heart failure: Secondary | ICD-10-CM | POA: Insufficient documentation

## 2024-04-12 DIAGNOSIS — I5022 Chronic systolic (congestive) heart failure: Secondary | ICD-10-CM | POA: Diagnosis not present

## 2024-04-12 DIAGNOSIS — I10 Essential (primary) hypertension: Secondary | ICD-10-CM | POA: Diagnosis not present

## 2024-04-12 DIAGNOSIS — I2510 Atherosclerotic heart disease of native coronary artery without angina pectoris: Secondary | ICD-10-CM | POA: Insufficient documentation

## 2024-04-12 DIAGNOSIS — Z7982 Long term (current) use of aspirin: Secondary | ICD-10-CM | POA: Diagnosis not present

## 2024-04-12 DIAGNOSIS — R413 Other amnesia: Secondary | ICD-10-CM | POA: Insufficient documentation

## 2024-04-12 DIAGNOSIS — I428 Other cardiomyopathies: Secondary | ICD-10-CM | POA: Insufficient documentation

## 2024-04-12 DIAGNOSIS — E119 Type 2 diabetes mellitus without complications: Secondary | ICD-10-CM | POA: Insufficient documentation

## 2024-04-12 DIAGNOSIS — Z79899 Other long term (current) drug therapy: Secondary | ICD-10-CM | POA: Insufficient documentation

## 2024-04-12 DIAGNOSIS — E785 Hyperlipidemia, unspecified: Secondary | ICD-10-CM | POA: Insufficient documentation

## 2024-04-12 DIAGNOSIS — Z7984 Long term (current) use of oral hypoglycemic drugs: Secondary | ICD-10-CM | POA: Insufficient documentation

## 2024-04-12 DIAGNOSIS — I493 Ventricular premature depolarization: Secondary | ICD-10-CM | POA: Insufficient documentation

## 2024-04-12 DIAGNOSIS — J449 Chronic obstructive pulmonary disease, unspecified: Secondary | ICD-10-CM | POA: Insufficient documentation

## 2024-04-12 NOTE — Progress Notes (Signed)
 "    [facility] Note  PCP:  Pia Kerney SQUIBB, MD  HF Cardiologist:  Dr. Cherrie   HPI: Roy Duke is a 74 y.o. male with HTN, HL, DM2, former tobacco/ETOH use (quit 30 years ago), systolic HF due to NICM, memory impairment, COPD  Admitted 12/20 to [facility] with severe hyponatremia and HF.  Echo EF 25%. Developed NSVT with polymorphic ectopy. ECG with RBBB and new lateral TWI. He was transferred to [facility].   LHC w/ mild, nonobstructive CAD. Well compensated hemodynamics on RHC. cMRi EF 28% w/ nonspecific inferoseptal RV insertion site LGE. ECV percentage elevated at 39%. 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  He was started on amiodarone  for suppression of PVCs.   Sleep study 3/21 AHI 2.3  Echo 5/21: EF 40-45% Echo 1/22 EF 35-40% RV mildly HK.   cMRI 3/22 - repeated due to persistent LV dysfunction despite PVC suppression 1. LVEF 40% ECV = 38%.Midwall LGE in the basal anterior and anteroseptal walls, 2. RVEF 43%  3. When compared to the prior study from 05/29/2019, there is decrease in size in both ventricles as well as improvement in biventricular systolic function. ECV is 38%. There are 2 segments of midwall nonspecific LGE that might be associated with idiopathic NICM. Consider sarcoid   Echo (12/28/20): EF 50-55%  Amio stopped 09/22.  Cardiac monitor 04/23: 16.5% PVC burden. He was unable to tolerate mexiletine and was started on amiodarone . Saw Dr. Kelsie and PVC ablation to be considered if PVC burden did not improve with amiodarone . Amio stopped  Echo 7/23 EF 45-50%, RV okay.  Zio 07/23: SR with < 1% PVC burden  Echo 12/26/21 EF 50-55% mild HK of mid to distal lateral/inferolateral walls G1DD   Saw EP ([name]) 10/23 . No PVCs on ECG. Watchful waiting. If PVCs recur -> consider ablation   Echo 7/24 EF 45-50%   CPX 11/24: pVO2 21.1 slop 33 RER 1.11   Echo 7/25 EF 45-50%, RV ok  Today he returns for HF follow up with his  wife. Not exercising as much due to the cold. No CP or SOB. No edema, orthopnea or PND.  Compliant with meds.     Past Medical History:  Diagnosis Date   Anxiety    Asthma    CHF (congestive heart failure) (HCC)    Diabetes mellitus without complication (HCC)    GERD (gastroesophageal reflux disease)    Hyperlipidemia    Hypertension    NSVT (nonsustained ventricular tachycardia) (HCC)    PVC's (premature ventricular contractions)    Substance abuse (HCC)    Past Surgical History:  Procedure Laterality Date   INGUINAL HERNIA REPAIR Bilateral    RIGHT/LEFT HEART CATH AND CORONARY ANGIOGRAPHY N/A 05/26/2019   Procedure: RIGHT/LEFT HEART CATH AND CORONARY ANGIOGRAPHY;  Surgeon: Cherrie Toribio SAUNDERS, MD;  Location: [facility] INVASIVE CV [facility];  Service: Cardiovascular;  Laterality: N/A;   Current Outpatient Medications  Medication Sig Dispense Refill   aspirin  EC 81 MG EC tablet Take 1 tablet (81 mg total) by mouth daily. 90 tablet 0   atorvastatin  (LIPITOR) 40 MG tablet Take 40 mg by mouth daily at 6 PM.      carvedilol  (COREG ) 12.5 MG tablet Take 1 tablet (12.5 mg total) by mouth 2 (two) times daily. 180 tablet 3   cholecalciferol  (VITAMIN D3) 25 MCG (1000 UNIT) tablet Take 1,000 Units by mouth daily.     dapagliflozin  propanediol (FARXIGA ) 10 MG TABS tablet  Take 1 tablet (10 mg total) by mouth daily before breakfast. 90 tablet 3   Dupilumab (DUPIXENT [place]) Inject 1 Dose into the skin every 14 (fourteen) days.     escitalopram (LEXAPRO) 10 MG tablet Take 10 mg by mouth daily.     LIDOCAINE -MENTHOL, SPRAY, EX Apply 1 spray topically as needed.     metFORMIN  (GLUCOPHAGE ) 500 MG tablet Take 500 mg by mouth 2 (two) times daily with a meal.      omeprazole (PRILOSEC) 40 MG capsule Take 40 mg by mouth daily.     sacubitril -valsartan  (ENTRESTO ) 97-103 MG Take 1 tablet by mouth 2 (two) times daily. 60 tablet 11   spironolactone  (ALDACTONE ) 25 MG tablet TAKE 1 TABLET BY MOUTH AT BEDTIME 90 tablet 1    torsemide  (DEMADEX ) 20 MG tablet Take 20 mg by mouth as needed.     traMADol  (ULTRAM -ER) 100 MG 24 hr tablet Take 100 mg by mouth daily as needed for pain.     vitamin C (ASCORBIC ACID ) 500 MG tablet Take 500 mg by mouth daily.     bacitracin  ointment Apply 1 Application topically 2 (two) times daily. (Patient not taking: Reported on 04/12/2024) 120 g 0   No current facility-administered medications for this encounter.   Allergies:   Benadryl [diphenhydramine], Codeine, and Penicillins   Social History:  The patient  reports that he quit smoking about 35 years ago. His smoking use included cigarettes. He has never used smokeless tobacco. He reports that he does not currently use alcohol. He reports that he does not currently use drugs.   Family History:  The patient's family history includes CAD in his paternal uncle; Heart attack in his paternal grandfather; Heart disease in his father.   ROS:  Please see the history of present illness.   All other systems are personally reviewed and negative.   Wt Readings from Last 3 Encounters:  04/12/24 81.5 kg (179 lb 9.6 oz)  10/12/23 82.5 kg (181 lb 12.8 oz)  07/15/23 82.6 kg (182 lb 3.2 oz)   BP 134/80   Pulse 73   Wt 81.5 kg (179 lb 9.6 oz)   SpO2 96%   BMI 25.77 kg/m   Physical Exam:   General:  Sitting up. No resp difficulty HEENT: normal Neck: supple. no JVD.  Cor: Regular rate & rhythm. No rubs, gallops or murmurs. Lungs: clear Abdomen: soft, nontender, nondistended.Good bowel sounds. Extremities: no cyanosis, clubbing, rash, edema Neuro: alert & orientedx3, cranial nerves grossly intact. moves all 4 extremities w/o difficulty. Affect pleasant  ECG: NSR 76 RBBB Personally reviewed   ASSESSMENT AND PLAN: 1. Chronic Systolic Heart Failure (NICM) - Echo @ [place] 2/21 EF of 25 to 30% with severe global hypokinesis.  RV also noted to be moderately enlarged with moderately impaired RV systolic function. - LHC 05/26/19 w/ mild,  nonobstructive CAD. RHC w/ compensated hemodynamics  - cMRI 2/21 EF 18% w/ nonspecific inferoseptal RV insertion site LGE. ECV percentage elevated at 39%, can see w/ increased fibrosis with cardiomyopathy or myocarditis. Suspect PVC also contributing. PVCs now suppressed with amio.  - Echo 08/29/19 EF 40-45% (improved with PVC suppression) - Echo 04/24/20 EF 35-40% RV mildly HK. 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 Now that myocarditis improved PVCs have resolved. Doubt sarcoid as EF unlikely to get better without steroid treatment - Echo 10/12/23, EF 45-50%, normal RV - NYHA I-II volume ok  - Continue torsemide  20 mg PRN - has not needed - Continue carvedilol  12.5 mg bid. - Continue Entresto  97/103 mg bid. - Continue spironolactone  25 mg daily.  - Continue Farxiga  10 mg daily  2. PVCs - Zio 2/21 burden ~8-10% - Did not tolerate mexilitene. PVCs suppressed with amio but EF did not normalized. Amio now off (stopped 9/22) - Sleep study negative - Zio 2/22 Rare PVCs (<1%) - Zio 04/23: 16.5% PVC burden - Unable to tolerate mexiletine. Amio restarted then stopped in 7/23 TSH normal 07/23.  - 14 day zio 07/23: < 1% PVC burden  3. HTN - Blood pressure well controlled. Continue current regimen. - GDMT as above  4. HLD - Continue atorvastatin  40 mg daily. - PCP following   5. DM2 - Continue Metformin  & Farxiga   - Followed by PCP.   Toribio Fuel, MD  04/12/2024 3:02 PM  "

## 2024-04-12 NOTE — Addendum Note (Signed)
 Encounter addended by: Esli Clements B, RN on: 04/12/2024 3:09 PM  Actions taken: Clinical Note Signed

## 2024-04-12 NOTE — Patient Instructions (Signed)
" °  Follow-Up in: 12 MONTHS WITH DR. CHERRIE PLEASE CALL OUR OFFICE AROUND NOVEMBER TO GET SCHEDULED FOR YOUR APPOINTMENT. PHONE NUMBER IS [phone] OPTION 2   At the [facility], you and your health needs are our priority. We have a designated team specialized in the treatment of Heart Failure. This Care Team includes your primary Heart Failure Specialized Cardiologist (physician), Advanced Practice Providers (APPs- Physician Assistants and Nurse Practitioners), and Pharmacist who all work together to provide you with the care you need, when you need it.   You may see any of the following providers on your designated Care Team at your next follow up:  Dr. Toribio Cherrie Dr. Ezra Shuck Dr. Odis Brownie Greig Mosses, NP Caffie Shed, [place] [facility] [place], [place] Beckey Coe, NP Jordan Lee, NP Tinnie Redman, PharmD   Please be sure to bring in all your medications bottles to every appointment.   Need to Contact Us :  If you have any questions or concerns before your next appointment please send us  a message through [place] or call our office at [phone].    TO LEAVE A MESSAGE FOR THE NURSE SELECT OPTION 2, PLEASE LEAVE A MESSAGE INCLUDING: YOUR NAME DATE OF BIRTH CALL BACK NUMBER REASON FOR CALL**this is important as we prioritize the call backs  YOU WILL RECEIVE A CALL BACK THE SAME DAY AS LONG AS YOU CALL BEFORE 4:00 PM  "
